# Patient Record
Sex: Male | Born: 1980 | Race: White | Hispanic: No | Marital: Married | Smoking: Never smoker
Health system: Southern US, Community
[De-identification: ages and names within clinical notes are randomized; demographics above are authoritative.]

## PROBLEM LIST (undated history)

## (undated) DIAGNOSIS — J302 Other seasonal allergic rhinitis: Secondary | ICD-10-CM

## (undated) DIAGNOSIS — G4489 Other headache syndrome: Secondary | ICD-10-CM

## (undated) DIAGNOSIS — J309 Allergic rhinitis, unspecified: Secondary | ICD-10-CM

## (undated) DIAGNOSIS — M4200 Juvenile osteochondrosis of spine, site unspecified: Secondary | ICD-10-CM

## (undated) DIAGNOSIS — F410 Panic disorder [episodic paroxysmal anxiety] without agoraphobia: Secondary | ICD-10-CM

## (undated) DIAGNOSIS — F988 Other specified behavioral and emotional disorders with onset usually occurring in childhood and adolescence: Secondary | ICD-10-CM

## (undated) DIAGNOSIS — H47393 Other disorders of optic disc, bilateral: Secondary | ICD-10-CM

## (undated) DIAGNOSIS — N200 Calculus of kidney: Secondary | ICD-10-CM

## (undated) DIAGNOSIS — F419 Anxiety disorder, unspecified: Secondary | ICD-10-CM

## (undated) DIAGNOSIS — K5790 Diverticulosis of intestine, part unspecified, without perforation or abscess without bleeding: Secondary | ICD-10-CM

## (undated) DIAGNOSIS — M5030 Other cervical disc degeneration, unspecified cervical region: Secondary | ICD-10-CM

## (undated) DIAGNOSIS — S060XAA Concussion with loss of consciousness status unknown, initial encounter: Secondary | ICD-10-CM

## (undated) DIAGNOSIS — Z5189 Encounter for other specified aftercare: Secondary | ICD-10-CM

## (undated) DIAGNOSIS — R1013 Epigastric pain: Secondary | ICD-10-CM

## (undated) DIAGNOSIS — F329 Major depressive disorder, single episode, unspecified: Secondary | ICD-10-CM

## (undated) DIAGNOSIS — K589 Irritable bowel syndrome without diarrhea: Secondary | ICD-10-CM

## (undated) DIAGNOSIS — M47814 Spondylosis without myelopathy or radiculopathy, thoracic region: Secondary | ICD-10-CM

## (undated) DIAGNOSIS — S060X9A Concussion with loss of consciousness of unspecified duration, initial encounter: Secondary | ICD-10-CM

## (undated) DIAGNOSIS — N411 Chronic prostatitis: Secondary | ICD-10-CM

## (undated) DIAGNOSIS — F32A Depression, unspecified: Secondary | ICD-10-CM

## (undated) DIAGNOSIS — H539 Unspecified visual disturbance: Secondary | ICD-10-CM

## (undated) DIAGNOSIS — E538 Deficiency of other specified B group vitamins: Secondary | ICD-10-CM

## (undated) HISTORY — DX: Major depressive disorder, single episode, unspecified: F32.9

## (undated) HISTORY — DX: Other cervical disc degeneration, unspecified cervical region: M50.30

## (undated) HISTORY — DX: Other specified behavioral and emotional disorders with onset usually occurring in childhood and adolescence: F98.8

## (undated) HISTORY — DX: Anxiety disorder, unspecified: F41.9

## (undated) HISTORY — DX: Diverticulosis of intestine, part unspecified, without perforation or abscess without bleeding: K57.90

## (undated) HISTORY — DX: Allergic rhinitis, unspecified: J30.9

## (undated) HISTORY — DX: Juvenile osteochondrosis of spine, site unspecified: M42.00

## (undated) HISTORY — DX: Irritable bowel syndrome, unspecified: K58.9

## (undated) HISTORY — DX: Depression, unspecified: F32.A

## (undated) HISTORY — DX: Spondylosis without myelopathy or radiculopathy, thoracic region: M47.814

## (undated) HISTORY — PX: REFRACTIVE SURGERY: SHX103

## (undated) HISTORY — DX: Other disorders of optic disc, bilateral: H47.393

## (undated) HISTORY — DX: Chronic prostatitis: N41.1

## (undated) HISTORY — DX: Other headache syndrome: G44.89

## (undated) HISTORY — DX: Unspecified visual disturbance: H53.9

## (undated) HISTORY — DX: Calculus of kidney: N20.0

---
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
## 2009-12-13 HISTORY — PX: LASIK: SHX215

## 2014-08-27 DIAGNOSIS — N3010 Interstitial cystitis (chronic) without hematuria: Secondary | ICD-10-CM | POA: Insufficient documentation

## 2015-09-01 ENCOUNTER — Ambulatory Visit (INDEPENDENT_AMBULATORY_CARE_PROVIDER_SITE_OTHER): Payer: 59 | Admitting: Family Medicine

## 2015-09-01 ENCOUNTER — Encounter: Payer: Self-pay | Admitting: Family Medicine

## 2015-09-01 VITALS — BP 112/72 | HR 78 | Temp 98.2°F | Resp 16 | Ht 71.5 in | Wt 173.0 lb

## 2015-09-01 DIAGNOSIS — Z23 Encounter for immunization: Secondary | ICD-10-CM

## 2015-09-01 DIAGNOSIS — F900 Attention-deficit hyperactivity disorder, predominantly inattentive type: Secondary | ICD-10-CM

## 2015-09-01 DIAGNOSIS — N411 Chronic prostatitis: Secondary | ICD-10-CM

## 2015-09-01 DIAGNOSIS — F411 Generalized anxiety disorder: Secondary | ICD-10-CM | POA: Diagnosis not present

## 2015-09-01 DIAGNOSIS — F410 Panic disorder [episodic paroxysmal anxiety] without agoraphobia: Secondary | ICD-10-CM

## 2015-09-01 DIAGNOSIS — F909 Attention-deficit hyperactivity disorder, unspecified type: Secondary | ICD-10-CM

## 2015-09-01 MED ORDER — PAROXETINE HCL 20 MG PO TABS: 20.0000 mg | ORAL_TABLET | Freq: Every day | ORAL | Status: DC

## 2015-09-01 NOTE — Progress Notes (Signed)
Office Note 09/01/2015  CC:  Chief Complaint  Patient presents with  . Establish Care  . Follow-up    medications    HPI:  Bernard Dalton is a 34 y.o. White male who is here to establish care. Patient's most recent primary MD: Dr. Wayne Both, [place], [place]. Old records were not reviewed prior to or during today's visit.  Had been plagued by depression sx's as teen and college student, ended up having to drop out of school at one point.  He says he ended up being dx'd as adult ADHD by a psychiatrist.  Has been doing well on low dose adderall.  GAD with some times of panic attacks: paxil helps, rarely takes xanax (usually situational/when traveling). Interestingly, he has chronic prostatitis sx's for which he has been put on flomax nightly, but also says these sx's flare when he is in a high anxiety state.  He has about 3 mo-worth of adderall between current bottle and 2 additional rx's given by his previous PMD, so he does not need RF of this med at this time and we plan on having him back for routine recheck of this problem in 3 mo.   Past Medical History  Diagnosis Date  . Anxiety and depression   . Diverticulosis     'itis x 2 episodes (dx'd by CT each time)  . Chronic prostatitis   . ADD (attention deficit disorder)   . Nephrolithiasis     noted on CT (1-2 mm)-has never passed a stone that he knows of.  . 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 Lasik Bilateral 2011    Family History  Problem Relation Age of Onset  . Alcohol abuse Mother   . Alcohol abuse Father   . Kidney disease Father   . Alcohol abuse Maternal Grandmother   . Alcohol abuse Maternal Grandfather   . Stroke Paternal Grandmother   . Congestive Heart Failure Paternal Grandmother   . Alcohol abuse Paternal Grandmother   . Alcohol abuse Paternal Grandfather     Social History   Social History  . Marital Status:  Married    Spouse Name: N/A  . Number of Children: N/A  . Years of Education: N/A   Occupational History  . Not on file.   Social History Main Topics  . Smoking status: Never Smoker   . Smokeless tobacco: Never Used  . Alcohol Use: No  . Drug Use: No  . Sexual Activity: Not on file   Other Topics Concern  . Not on file   Social History Narrative   Married, 1 child and 1 on the way.   Relocated from [facility] to [facility] 08/2015.   Educ: college at [employer].   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   No T/A/Ds.    Outpatient Encounter Prescriptions as of 09/01/2015  Medication Sig  . ADDERALL XR 5 MG 24 hr capsule Take 5 mg by mouth daily.  Marland Kitchen ALPRAZolam (XANAX) 0.25 MG tablet Take 1 tablet by mouth daily as needed.  Marland Kitchen PARoxetine (PAXIL) 20 MG tablet Take 1 tablet (20 mg total) by mouth daily.  . tamsulosin (FLOMAX) 0.4 MG CAPS capsule Take 0.4 mg by mouth daily.  . [DISCONTINUED] PARoxetine (PAXIL) 20 MG tablet Take 20 mg by mouth daily.   No facility-administered encounter medications on file as of 09/01/2015.    No Known Allergies  ROS Review  of Systems  Constitutional: Negative for fever and fatigue.  HENT: Negative for congestion and sore throat.   Eyes: Negative for visual disturbance.  Respiratory: Negative for cough.   Cardiovascular: Negative for chest pain.  Gastrointestinal: Negative for nausea and abdominal pain.  Genitourinary: Negative for dysuria.  Musculoskeletal: Negative for back pain and joint swelling.  Skin: Negative for rash.  Neurological: Negative for weakness and headaches.  Hematological: Negative for adenopathy.    PE; Blood pressure 112/72, pulse 78, temperature 98.2 F (36.8 C), temperature source Oral, resp. rate 16, height 5' 11.5" (1.816 m), weight 173 lb (78.472 kg), SpO2 100 %. Wt Readings from Last 2 Encounters:  09/01/15 173 lb (78.472 kg)    Gen: alert, oriented x 4, affect pleasant.  Lucid thinking and conversation noted. HEENT:  PERRLA, EOMI.   Neck: no LAD, mass, or thyromegaly. CV: RRR, no m/r/g LUNGS: CTA bilat, nonlabored. NEURO: no tremor or tics noted on observation.  Coordination intact. CN 2-12 grossly intact bilaterally, strength 5/5 in all extremeties.  No ataxia. Patellar DTRs 2+ bilat, 1 beat clonus in ankles bilat  Pertinent labs:  none  ASSESSMENT AND PLAN:   New pt; obtain old records.  1) Adult ADHD: The current medical regimen is effective;  continue present plan and medications. No new rx's needed today. F/u 3 mo and he'll need new rx's at that time.  2) GAD with panic: The current medical regimen is effective;  continue present plan and medications.  3) Chronic prostatitis: non-infectious??. Continue flomax.  An After Visit Summary was printed and given to the patient.  Return in about 3 months (around 12/01/2015) for f/u adult ADHD.

## 2015-09-01 NOTE — Addendum Note (Signed)
Addended by: Lanae Crumbly on: 09/01/2015 10:56 AM   Modules accepted: Orders

## 2015-09-01 NOTE — Progress Notes (Signed)
Pre visit review using our clinic review tool, if applicable. No additional management support is needed unless otherwise documented below in the visit note. 

## 2015-09-07 ENCOUNTER — Encounter: Payer: Self-pay | Admitting: Family Medicine

## 2016-02-20 ENCOUNTER — Encounter: Payer: Self-pay | Admitting: Family Medicine

## 2016-02-20 ENCOUNTER — Ambulatory Visit (INDEPENDENT_AMBULATORY_CARE_PROVIDER_SITE_OTHER): Payer: 59 | Admitting: Family Medicine

## 2016-02-20 VITALS — BP 119/78 | HR 73 | Temp 97.9°F | Resp 16 | Ht 71.5 in | Wt 187.2 lb

## 2016-02-20 DIAGNOSIS — F900 Attention-deficit hyperactivity disorder, predominantly inattentive type: Secondary | ICD-10-CM

## 2016-02-20 DIAGNOSIS — R3915 Urgency of urination: Secondary | ICD-10-CM

## 2016-02-20 DIAGNOSIS — F418 Other specified anxiety disorders: Secondary | ICD-10-CM | POA: Diagnosis not present

## 2016-02-20 DIAGNOSIS — F32A Depression, unspecified: Secondary | ICD-10-CM

## 2016-02-20 DIAGNOSIS — F419 Anxiety disorder, unspecified: Principal | ICD-10-CM

## 2016-02-20 DIAGNOSIS — F909 Attention-deficit hyperactivity disorder, unspecified type: Secondary | ICD-10-CM

## 2016-02-20 DIAGNOSIS — F329 Major depressive disorder, single episode, unspecified: Secondary | ICD-10-CM

## 2016-02-20 MED ORDER — PAROXETINE HCL 20 MG PO TABS: 20.0000 mg | ORAL_TABLET | Freq: Every day | ORAL | Status: DC

## 2016-02-20 NOTE — Progress Notes (Signed)
Pre visit review using our clinic review tool, if applicable. No additional management support is needed unless otherwise documented below in the visit note. 

## 2016-02-20 NOTE — Progress Notes (Signed)
OFFICE VISIT  02/20/2016   CC:  Chief Complaint  Patient presents with  . Follow-up    Anxiety/Depression    HPI:    Patient is a 35 y.o. Caucasian male who presents for f/u anxiety and depression, med issues. He describes recent worsening of anxiety that he blames on taking adderall and paxil together. Has prob's with recurrent prostatitis-type issues, lots of this anxiety has to do with his fears about his urination problem. Paxil has helped but he would prefer to ween off this and see a counselor: says he feels like this med makes him feel like he's in a fog and plus he always feels hungry.  Has gained 14 lbs since last visit with me about 6 mo ago. He has not taken the adderall in about 5 months.  He has recently been taking elmiron for the last few months--a leftover rx from a urologist her saw in [place].  No cystoscopy has been done yet but pt feels like he is ready to proceed with this type of diagnostic procedure if it would help figure out what's wrong. He is not sure if he is seeing any improvement in his sx's or not since getting on elmiron.   Suffers from urinary urgency but says it is ONLY when he leaves his house.  Denies depression.  Past Medical History  Diagnosis Date  . Anxiety and depression   . Diverticulosis     with 'itis per pt report; however, review of old record CT reports shows mild SB enteritis with mesenteric adenopathy 06/2015; then 11/2014 CT abd/pelv done for urinary frequency and hematuria showed NORMAL abd/pelv  . Chronic prostatitis   . ADD (attention deficit disorder)   . Nephrolithiasis     Nonobstructive, noted on CT (1-2 mm)-has never passed a stone that he knows of.  . 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 IBS (irritable bowel syndrome)     diarr predom as per old PCP records    Past Surgical History  Procedure Laterality Date  . Lasik Bilateral 2011   MEDS: not taking adderall or flomax listed below. He is  taking elmiron 100 mg tid rx'd by a Urologist in [place] in the past.  Outpatient Prescriptions Prior to Visit  Medication Sig Dispense Refill  . ALPRAZolam (XANAX) 0.25 MG tablet Take 1 tablet by mouth daily as needed. Reported on 02/20/2016  2  . PARoxetine (PAXIL) 20 MG tablet Take 1 tablet (20 mg total) by mouth daily. 90 tablet 3  . ADDERALL XR 5 MG 24 hr capsule Take 5 mg by mouth daily. Reported on 02/20/2016  0  . tamsulosin (FLOMAX) 0.4 MG CAPS capsule Take 0.4 mg by mouth daily. Reported on 02/20/2016  11   No facility-administered medications prior to visit.    No Known Allergies  ROS As per HPI  PE: Blood pressure 119/78, pulse 73, temperature 97.9 F (36.6 C), temperature source Oral, resp. rate 16, height 5' 11.5" (1.816 m), weight 187 lb 4 oz (84.936 kg), SpO2 96 %. Gen: Alert, well appearing.  Patient is oriented to person, place, time, and situation. No further exam today.  LABS:  none  IMPRESSION AND PLAN:  1) Chronic anxiety, with some superimposed panic.  This was made worse by combining paxil and adderall (he did fine on adderall by itself prior to paxil).  Stopping adderall has made it where he can tolerate and is helped by paxil, but he wants to see a  psychologist for help with his anxiety---esp the part surrounding his urinary urgency issues.  Gave pt the contact info today for psychologist Doroteo Glassman in [place]. Pt does want to ween down off the paxil at some point but NOT now, so this med was RF'd again today.  2) Recurrent urinary urgency, ? Only when out in public?   Pt is aware that this may be all anxiety related, but he does want to seek out a local urologist for consulation. I gave him the contact info for Dr. Risa Grill with [facility].  An After Visit Summary was printed and given to the patient.  FOLLOW UP: Return in about 6 months (around 08/22/2016) for f/u anxiety.

## 2016-02-20 NOTE — Patient Instructions (Signed)
Psychologist: Doroteo Glassman [phone] (In the office of Dr. Letta Moynahan at [facility] in [place]).  Urologist: Dr. Rana Snare with [facility] in [place].

## 2016-07-14 DIAGNOSIS — R278 Other lack of coordination: Secondary | ICD-10-CM | POA: Diagnosis not present

## 2016-07-14 DIAGNOSIS — M62838 Other muscle spasm: Secondary | ICD-10-CM | POA: Diagnosis not present

## 2016-07-14 DIAGNOSIS — M6281 Muscle weakness (generalized): Secondary | ICD-10-CM | POA: Diagnosis not present

## 2016-07-14 DIAGNOSIS — M545 Low back pain: Secondary | ICD-10-CM | POA: Diagnosis not present

## 2016-07-16 DIAGNOSIS — F411 Generalized anxiety disorder: Secondary | ICD-10-CM | POA: Diagnosis not present

## 2016-07-22 DIAGNOSIS — F419 Anxiety disorder, unspecified: Secondary | ICD-10-CM | POA: Diagnosis not present

## 2016-07-23 DIAGNOSIS — M62838 Other muscle spasm: Secondary | ICD-10-CM | POA: Diagnosis not present

## 2016-07-23 DIAGNOSIS — R278 Other lack of coordination: Secondary | ICD-10-CM | POA: Diagnosis not present

## 2016-07-23 DIAGNOSIS — R102 Pelvic and perineal pain: Secondary | ICD-10-CM | POA: Diagnosis not present

## 2016-07-23 DIAGNOSIS — M6281 Muscle weakness (generalized): Secondary | ICD-10-CM | POA: Diagnosis not present

## 2016-07-28 ENCOUNTER — Ambulatory Visit (INDEPENDENT_AMBULATORY_CARE_PROVIDER_SITE_OTHER): Payer: BLUE CROSS/BLUE SHIELD | Admitting: Family Medicine

## 2016-07-28 ENCOUNTER — Encounter: Payer: Self-pay | Admitting: Family Medicine

## 2016-07-28 VITALS — BP 125/75 | HR 64 | Temp 98.4°F | Resp 16 | Ht 71.5 in | Wt 164.0 lb

## 2016-07-28 DIAGNOSIS — R000 Tachycardia, unspecified: Secondary | ICD-10-CM | POA: Diagnosis not present

## 2016-07-28 DIAGNOSIS — N3010 Interstitial cystitis (chronic) without hematuria: Secondary | ICD-10-CM | POA: Diagnosis not present

## 2016-07-28 DIAGNOSIS — E162 Hypoglycemia, unspecified: Secondary | ICD-10-CM | POA: Diagnosis not present

## 2016-07-28 LAB — GLUCOSE, POCT (MANUAL RESULT ENTRY): POC Glucose: 104 mg/dl — AB (ref 70–99)

## 2016-07-28 NOTE — Progress Notes (Signed)
OFFICE VISIT  07/28/2016   CC:  Chief Complaint  Patient presents with  . Hypoglycemia    fasting BS of 27 this morning   HPI:    Patient is a 35 y.o. Caucasian male who presents for ongoing anxiety and anxiety-related physical symptoms.  Started getting worse about 7 weeks ago. Recently saw NP or counselor at his psychiatrist's office b/c of recurrent panic type feelings.  She suggested he check his glucose. He got a glucometer and started checking glucose yesterday:  checked his sugar right after eating and it was 40 once, 33 about 15 min later.  One hour later it was 102.  This morning it was 27 fasting.  He ate oatmeal about an hour later, and then glucose check 1 hour later was 94.  Describes heart racing, palms sweaty, jittery, feeling anxious/unease.  Says these episodes occur "regularly" and got worse when he went on a diet.  He thinks the sx's are improved some since stopping the diet and starting to eat more carbs.  Upon further reflection, he does think his sx's occur more when he is fasting/between meals and seems better when he has eaten.  Since I saw him last he has established with Doroteo Glassman for counseling and Dr. Lita Mains for psychiatry. He has also started seeing Dr. Gaynelle Arabian at [facility] and he gets pelvic floor PT there as well. Says urologic sx's are getting better with PT.  Past Medical History:  Diagnosis Date  . ADD (attention deficit disorder)   . Anxiety and depression   . Chronic prostatitis   . Diverticulosis    with 'itis per pt report; however, review of old record CT reports shows mild SB enteritis with mesenteric adenopathy 06/2015; then 11/2014 CT abd/pelv done for urinary frequency and hematuria showed NORMAL abd/pelv  . IBS (irritable bowel syndrome)    diarr predom as per old PCP records  . Nephrolithiasis    Nonobstructive, noted on CT (1-2 mm)-has never passed a stone that he knows of.  . Scheuermann's kyphosis    juvenile kyphosis (no  surgery)    Past Surgical History:  Procedure Laterality Date  . LASIK Bilateral 2011    Outpatient Medications Prior to Visit  Medication Sig Dispense Refill  . ALPRAZolam (XANAX) 0.25 MG tablet Take 1 tablet by mouth daily as needed. Reported on 02/20/2016  2  . ELMIRON 100 MG capsule Take 1 capsule by mouth 3 (three) times daily.  7  . PARoxetine (PAXIL) 20 MG tablet Take 1 tablet (20 mg total) by mouth daily. (Patient not taking: Reported on 07/28/2016) 30 tablet 11   No facility-administered medications prior to visit.     No Known Allergies  ROS As per HPI  PE: Blood pressure 125/75, pulse 64, temperature 98.4 F (36.9 C), temperature source Oral, resp. rate 16, height 5' 11.5" (1.816 m), weight 164 lb (74.4 kg), SpO2 99 %. Gen: Alert, well appearing.  Patient is oriented to person, place, time, and situation. AFFECT: pleasant, lucid thought and speech. [id number]: no injection, icteris, swelling, or exudate.  EOMI, PERRLA. Mouth: lips without lesion/swelling.  Oral mucosa pink and moist. Oropharynx without erythema, exudate, or swelling.  CV: RRR, no m/r/g.   LUNGS: CTA bilat, nonlabored resps, good aeration in all lung fields. EXT: no clubbing, cyanosis, or edema.  Neuro: CN 2-12 intact bilaterally, strength 5/5 in proximal and distal upper extremities and lower extremities bilaterally.   No tremor.    No ataxia.  LABS:  POCT glucose on our machine today was 104. Pt's glucometer today at same time was 101.  IMPRESSION AND PLAN:  Hypoglycemia, symptomatic.  Per pt's home glucometer readings, which correlates with our glucometer here in the office today.  Encouraged pt to eat many small meals throughout his day, continue to monitor home glucoses. He'll return tomorrow for fasting labs: CMET, CBC, TSH, T4/T3, insulin and C peptide levels, cortisol, prolactin, LH. Will proceed with abdominal imaging IF labs suggest dx of possible insulinoma. Holding off on endo referral  at this time.  An After Visit Summary was printed and given to the patient.  FOLLOW UP: Return for f/u to be determined based on results of w/u.  Signed:  Crissie Sickles, MD           07/28/2016

## 2016-07-28 NOTE — Progress Notes (Signed)
Pre visit review using our clinic review tool, if applicable. No additional management support is needed unless otherwise documented below in the visit note. 

## 2016-07-29 ENCOUNTER — Other Ambulatory Visit (INDEPENDENT_AMBULATORY_CARE_PROVIDER_SITE_OTHER): Payer: BLUE CROSS/BLUE SHIELD

## 2016-07-29 ENCOUNTER — Other Ambulatory Visit: Payer: Self-pay | Admitting: Family Medicine

## 2016-07-29 DIAGNOSIS — R000 Tachycardia, unspecified: Secondary | ICD-10-CM

## 2016-07-29 DIAGNOSIS — E162 Hypoglycemia, unspecified: Secondary | ICD-10-CM

## 2016-07-29 LAB — CBC WITH DIFFERENTIAL/PLATELET
BASOS ABS: 0 {cells}/uL (ref 0–200)
Basophils Relative: 0 %
Eosinophils Absolute: 43 cells/uL (ref 15–500)
Eosinophils Relative: 1 %
HCT: 44.3 % (ref 38.5–50.0)
HEMOGLOBIN: 15 g/dL (ref 13.2–17.1)
LYMPHS ABS: 1118 {cells}/uL (ref 850–3900)
LYMPHS PCT: 26 %
MCH: 29.6 pg (ref 27.0–33.0)
MCHC: 33.9 g/dL (ref 32.0–36.0)
MCV: 87.5 fL (ref 80.0–100.0)
MONO ABS: 258 {cells}/uL (ref 200–950)
MPV: 10.7 fL (ref 7.5–12.5)
Monocytes Relative: 6 %
Neutro Abs: 2881 cells/uL (ref 1500–7800)
Neutrophils Relative %: 67 %
Platelets: 178 10*3/uL (ref 140–400)
RBC: 5.06 MIL/uL (ref 4.20–5.80)
RDW: 13.7 % (ref 11.0–15.0)
WBC: 4.3 10*3/uL (ref 3.8–10.8)

## 2016-07-29 LAB — COMPREHENSIVE METABOLIC PANEL
ALBUMIN: 4.3 g/dL (ref 3.6–5.1)
ALK PHOS: 47 U/L (ref 40–115)
ALT: 10 U/L (ref 9–46)
AST: 10 U/L (ref 10–40)
BILIRUBIN TOTAL: 1.1 mg/dL (ref 0.2–1.2)
BUN: 10 mg/dL (ref 7–25)
CO2: 25 mmol/L (ref 20–31)
CREATININE: 1.04 mg/dL (ref 0.60–1.35)
Calcium: 9.7 mg/dL (ref 8.6–10.3)
Chloride: 106 mmol/L (ref 98–110)
Glucose, Bld: 96 mg/dL (ref 65–99)
Potassium: 4.6 mmol/L (ref 3.5–5.3)
SODIUM: 142 mmol/L (ref 135–146)
TOTAL PROTEIN: 6.3 g/dL (ref 6.1–8.1)

## 2016-07-29 LAB — TSH: TSH: 1.31 mIU/L (ref 0.40–4.50)

## 2016-07-29 LAB — T4, FREE: Free T4: 1.2 ng/dL (ref 0.8–1.8)

## 2016-07-29 LAB — HEMOGLOBIN A1C
HEMOGLOBIN A1C: 4.7 % (ref <5.7)
MEAN PLASMA GLUCOSE: 88 mg/dL

## 2016-07-30 LAB — T3: T3, Total: 121 ng/dL (ref 76–181)

## 2016-07-30 LAB — C-PEPTIDE: C-Peptide: 1.61 ng/mL (ref 0.80–3.85)

## 2016-07-30 LAB — LUTEINIZING HORMONE: LH: 2 m[IU]/mL (ref 1.5–9.3)

## 2016-07-30 LAB — CORTISOL: Cortisol, Plasma: 19.7 ug/dL

## 2016-07-30 LAB — PROLACTIN: PROLACTIN: 10.9 ng/mL (ref 2.0–18.0)

## 2016-07-31 LAB — INSULIN, RANDOM: INSULIN: 4.6 u[IU]/mL (ref 2.0–19.6)

## 2016-08-03 DIAGNOSIS — M62838 Other muscle spasm: Secondary | ICD-10-CM | POA: Diagnosis not present

## 2016-08-03 DIAGNOSIS — R278 Other lack of coordination: Secondary | ICD-10-CM | POA: Diagnosis not present

## 2016-08-03 DIAGNOSIS — M6281 Muscle weakness (generalized): Secondary | ICD-10-CM | POA: Diagnosis not present

## 2016-08-03 DIAGNOSIS — R102 Pelvic and perineal pain: Secondary | ICD-10-CM | POA: Diagnosis not present

## 2016-08-03 LAB — INSULIN ANTIBODIES, BLOOD: Insulin Antibodies, Human: <0.4 U/mL (ref <0.4)

## 2016-08-05 DIAGNOSIS — F419 Anxiety disorder, unspecified: Secondary | ICD-10-CM | POA: Diagnosis not present

## 2016-08-05 LAB — INSULIN, FREE (BIOACTIVE): INSULIN, FREE: 4.3 u[IU]/mL (ref 1.5–14.9)

## 2016-08-18 DIAGNOSIS — R278 Other lack of coordination: Secondary | ICD-10-CM | POA: Diagnosis not present

## 2016-08-18 DIAGNOSIS — M62838 Other muscle spasm: Secondary | ICD-10-CM | POA: Diagnosis not present

## 2016-08-18 DIAGNOSIS — M6281 Muscle weakness (generalized): Secondary | ICD-10-CM | POA: Diagnosis not present

## 2016-08-18 DIAGNOSIS — R102 Pelvic and perineal pain: Secondary | ICD-10-CM | POA: Diagnosis not present

## 2016-08-19 DIAGNOSIS — F419 Anxiety disorder, unspecified: Secondary | ICD-10-CM | POA: Diagnosis not present

## 2016-09-01 DIAGNOSIS — M545 Low back pain: Secondary | ICD-10-CM | POA: Diagnosis not present

## 2016-09-01 DIAGNOSIS — M62838 Other muscle spasm: Secondary | ICD-10-CM | POA: Diagnosis not present

## 2016-09-01 DIAGNOSIS — M6281 Muscle weakness (generalized): Secondary | ICD-10-CM | POA: Diagnosis not present

## 2016-09-01 DIAGNOSIS — R278 Other lack of coordination: Secondary | ICD-10-CM | POA: Diagnosis not present

## 2016-09-02 DIAGNOSIS — F419 Anxiety disorder, unspecified: Secondary | ICD-10-CM | POA: Diagnosis not present

## 2016-09-03 DIAGNOSIS — F411 Generalized anxiety disorder: Secondary | ICD-10-CM | POA: Diagnosis not present

## 2016-09-09 DIAGNOSIS — R278 Other lack of coordination: Secondary | ICD-10-CM | POA: Diagnosis not present

## 2016-09-09 DIAGNOSIS — M62838 Other muscle spasm: Secondary | ICD-10-CM | POA: Diagnosis not present

## 2016-09-09 DIAGNOSIS — R102 Pelvic and perineal pain: Secondary | ICD-10-CM | POA: Diagnosis not present

## 2016-09-09 DIAGNOSIS — M6281 Muscle weakness (generalized): Secondary | ICD-10-CM | POA: Diagnosis not present

## 2016-09-15 DIAGNOSIS — H5371 Glare sensitivity: Secondary | ICD-10-CM | POA: Diagnosis not present

## 2016-09-15 DIAGNOSIS — H53143 Visual discomfort, bilateral: Secondary | ICD-10-CM | POA: Diagnosis not present

## 2016-09-15 DIAGNOSIS — H01001 Unspecified blepharitis right upper eyelid: Secondary | ICD-10-CM | POA: Diagnosis not present

## 2016-09-15 DIAGNOSIS — H25013 Cortical age-related cataract, bilateral: Secondary | ICD-10-CM | POA: Diagnosis not present

## 2016-09-17 DIAGNOSIS — F419 Anxiety disorder, unspecified: Secondary | ICD-10-CM | POA: Diagnosis not present

## 2016-09-22 DIAGNOSIS — F419 Anxiety disorder, unspecified: Secondary | ICD-10-CM | POA: Diagnosis not present

## 2016-09-24 DIAGNOSIS — F411 Generalized anxiety disorder: Secondary | ICD-10-CM | POA: Diagnosis not present

## 2016-09-24 DIAGNOSIS — F902 Attention-deficit hyperactivity disorder, combined type: Secondary | ICD-10-CM | POA: Diagnosis not present

## 2016-09-24 DIAGNOSIS — F319 Bipolar disorder, unspecified: Secondary | ICD-10-CM | POA: Diagnosis not present

## 2016-09-24 DIAGNOSIS — F314 Bipolar disorder, current episode depressed, severe, without psychotic features: Secondary | ICD-10-CM | POA: Diagnosis not present

## 2016-09-27 DIAGNOSIS — H01001 Unspecified blepharitis right upper eyelid: Secondary | ICD-10-CM | POA: Diagnosis not present

## 2016-09-27 DIAGNOSIS — H25013 Cortical age-related cataract, bilateral: Secondary | ICD-10-CM | POA: Diagnosis not present

## 2016-09-27 DIAGNOSIS — H5371 Glare sensitivity: Secondary | ICD-10-CM | POA: Diagnosis not present

## 2016-09-27 DIAGNOSIS — H53143 Visual discomfort, bilateral: Secondary | ICD-10-CM | POA: Diagnosis not present

## 2016-09-29 DIAGNOSIS — F419 Anxiety disorder, unspecified: Secondary | ICD-10-CM | POA: Diagnosis not present

## 2016-10-06 DIAGNOSIS — F319 Bipolar disorder, unspecified: Secondary | ICD-10-CM | POA: Diagnosis not present

## 2016-10-06 DIAGNOSIS — F411 Generalized anxiety disorder: Secondary | ICD-10-CM | POA: Diagnosis not present

## 2016-10-06 DIAGNOSIS — F419 Anxiety disorder, unspecified: Secondary | ICD-10-CM | POA: Diagnosis not present

## 2016-10-06 DIAGNOSIS — F902 Attention-deficit hyperactivity disorder, combined type: Secondary | ICD-10-CM | POA: Diagnosis not present

## 2016-10-13 DIAGNOSIS — F419 Anxiety disorder, unspecified: Secondary | ICD-10-CM | POA: Diagnosis not present

## 2016-10-22 DIAGNOSIS — F419 Anxiety disorder, unspecified: Secondary | ICD-10-CM | POA: Diagnosis not present

## 2016-10-27 DIAGNOSIS — F319 Bipolar disorder, unspecified: Secondary | ICD-10-CM | POA: Diagnosis not present

## 2016-10-27 DIAGNOSIS — F902 Attention-deficit hyperactivity disorder, combined type: Secondary | ICD-10-CM | POA: Diagnosis not present

## 2016-10-27 DIAGNOSIS — F411 Generalized anxiety disorder: Secondary | ICD-10-CM | POA: Diagnosis not present

## 2016-10-27 DIAGNOSIS — F419 Anxiety disorder, unspecified: Secondary | ICD-10-CM | POA: Diagnosis not present

## 2016-11-07 DIAGNOSIS — S81811A Laceration without foreign body, right lower leg, initial encounter: Secondary | ICD-10-CM | POA: Diagnosis not present

## 2016-11-10 DIAGNOSIS — F419 Anxiety disorder, unspecified: Secondary | ICD-10-CM | POA: Diagnosis not present

## 2016-11-12 ENCOUNTER — Telehealth: Payer: Self-pay | Admitting: Family Medicine

## 2016-11-12 MED ORDER — SCOPOLAMINE 1 MG/3DAYS TD PT72: 1.0000 | MEDICATED_PATCH | TRANSDERMAL | 1 refills | Status: DC

## 2016-11-12 NOTE — Telephone Encounter (Signed)
Scopolamine patch eRx'd as per pt request.

## 2016-11-12 NOTE — Telephone Encounter (Signed)
Pt advised and voiced understanding.   

## 2016-11-12 NOTE — Telephone Encounter (Signed)
Patient is going on cruise in Feb. Can he get a patch for motional sickness?

## 2016-11-16 DIAGNOSIS — F419 Anxiety disorder, unspecified: Secondary | ICD-10-CM | POA: Diagnosis not present

## 2016-11-24 DIAGNOSIS — F419 Anxiety disorder, unspecified: Secondary | ICD-10-CM | POA: Diagnosis not present

## 2016-11-30 DIAGNOSIS — N3010 Interstitial cystitis (chronic) without hematuria: Secondary | ICD-10-CM | POA: Diagnosis not present

## 2016-12-01 DIAGNOSIS — F419 Anxiety disorder, unspecified: Secondary | ICD-10-CM | POA: Diagnosis not present

## 2016-12-24 DIAGNOSIS — F419 Anxiety disorder, unspecified: Secondary | ICD-10-CM | POA: Diagnosis not present

## 2017-01-03 DIAGNOSIS — F419 Anxiety disorder, unspecified: Secondary | ICD-10-CM | POA: Diagnosis not present

## 2017-01-07 DIAGNOSIS — F411 Generalized anxiety disorder: Secondary | ICD-10-CM | POA: Diagnosis not present

## 2017-01-11 DIAGNOSIS — F419 Anxiety disorder, unspecified: Secondary | ICD-10-CM | POA: Diagnosis not present

## 2017-01-27 DIAGNOSIS — F419 Anxiety disorder, unspecified: Secondary | ICD-10-CM | POA: Diagnosis not present

## 2017-02-02 DIAGNOSIS — F419 Anxiety disorder, unspecified: Secondary | ICD-10-CM | POA: Diagnosis not present

## 2017-02-08 DIAGNOSIS — F419 Anxiety disorder, unspecified: Secondary | ICD-10-CM | POA: Diagnosis not present

## 2017-02-16 ENCOUNTER — Encounter: Payer: Self-pay | Admitting: *Deleted

## 2017-02-16 DIAGNOSIS — F419 Anxiety disorder, unspecified: Secondary | ICD-10-CM | POA: Diagnosis not present

## 2017-03-03 DIAGNOSIS — F419 Anxiety disorder, unspecified: Secondary | ICD-10-CM | POA: Diagnosis not present

## 2017-03-10 DIAGNOSIS — F419 Anxiety disorder, unspecified: Secondary | ICD-10-CM | POA: Diagnosis not present

## 2017-03-16 DIAGNOSIS — F419 Anxiety disorder, unspecified: Secondary | ICD-10-CM | POA: Diagnosis not present

## 2017-03-23 DIAGNOSIS — F419 Anxiety disorder, unspecified: Secondary | ICD-10-CM | POA: Diagnosis not present

## 2017-03-23 DIAGNOSIS — F411 Generalized anxiety disorder: Secondary | ICD-10-CM | POA: Diagnosis not present

## 2017-03-29 DIAGNOSIS — F419 Anxiety disorder, unspecified: Secondary | ICD-10-CM | POA: Diagnosis not present

## 2017-04-05 DIAGNOSIS — N3010 Interstitial cystitis (chronic) without hematuria: Secondary | ICD-10-CM | POA: Diagnosis not present

## 2017-04-07 DIAGNOSIS — F419 Anxiety disorder, unspecified: Secondary | ICD-10-CM | POA: Diagnosis not present

## 2017-04-13 DIAGNOSIS — F419 Anxiety disorder, unspecified: Secondary | ICD-10-CM | POA: Diagnosis not present

## 2017-04-20 DIAGNOSIS — F419 Anxiety disorder, unspecified: Secondary | ICD-10-CM | POA: Diagnosis not present

## 2017-04-28 DIAGNOSIS — F419 Anxiety disorder, unspecified: Secondary | ICD-10-CM | POA: Diagnosis not present

## 2017-05-18 DIAGNOSIS — F411 Generalized anxiety disorder: Secondary | ICD-10-CM | POA: Diagnosis not present

## 2017-05-18 DIAGNOSIS — F319 Bipolar disorder, unspecified: Secondary | ICD-10-CM | POA: Diagnosis not present

## 2017-05-18 DIAGNOSIS — F902 Attention-deficit hyperactivity disorder, combined type: Secondary | ICD-10-CM | POA: Diagnosis not present

## 2017-05-18 DIAGNOSIS — F419 Anxiety disorder, unspecified: Secondary | ICD-10-CM | POA: Diagnosis not present

## 2017-05-25 DIAGNOSIS — F419 Anxiety disorder, unspecified: Secondary | ICD-10-CM | POA: Diagnosis not present

## 2017-06-02 DIAGNOSIS — F419 Anxiety disorder, unspecified: Secondary | ICD-10-CM | POA: Diagnosis not present

## 2017-06-21 DIAGNOSIS — F419 Anxiety disorder, unspecified: Secondary | ICD-10-CM | POA: Diagnosis not present

## 2017-06-28 DIAGNOSIS — F419 Anxiety disorder, unspecified: Secondary | ICD-10-CM | POA: Diagnosis not present

## 2017-07-07 DIAGNOSIS — F419 Anxiety disorder, unspecified: Secondary | ICD-10-CM | POA: Diagnosis not present

## 2017-07-13 DIAGNOSIS — F902 Attention-deficit hyperactivity disorder, combined type: Secondary | ICD-10-CM | POA: Diagnosis not present

## 2017-07-13 DIAGNOSIS — F319 Bipolar disorder, unspecified: Secondary | ICD-10-CM | POA: Diagnosis not present

## 2017-07-13 DIAGNOSIS — F411 Generalized anxiety disorder: Secondary | ICD-10-CM | POA: Diagnosis not present

## 2017-07-14 DIAGNOSIS — F419 Anxiety disorder, unspecified: Secondary | ICD-10-CM | POA: Diagnosis not present

## 2017-08-04 DIAGNOSIS — F419 Anxiety disorder, unspecified: Secondary | ICD-10-CM | POA: Diagnosis not present

## 2017-08-07 DIAGNOSIS — L255 Unspecified contact dermatitis due to plants, except food: Secondary | ICD-10-CM | POA: Diagnosis not present

## 2017-08-11 DIAGNOSIS — F419 Anxiety disorder, unspecified: Secondary | ICD-10-CM | POA: Diagnosis not present

## 2017-08-25 DIAGNOSIS — F419 Anxiety disorder, unspecified: Secondary | ICD-10-CM | POA: Diagnosis not present

## 2017-08-31 DIAGNOSIS — F419 Anxiety disorder, unspecified: Secondary | ICD-10-CM | POA: Diagnosis not present

## 2017-09-06 DIAGNOSIS — F419 Anxiety disorder, unspecified: Secondary | ICD-10-CM | POA: Diagnosis not present

## 2017-09-16 DIAGNOSIS — F419 Anxiety disorder, unspecified: Secondary | ICD-10-CM | POA: Diagnosis not present

## 2017-09-26 DIAGNOSIS — F419 Anxiety disorder, unspecified: Secondary | ICD-10-CM | POA: Diagnosis not present

## 2017-10-05 DIAGNOSIS — F419 Anxiety disorder, unspecified: Secondary | ICD-10-CM | POA: Diagnosis not present

## 2017-10-19 DIAGNOSIS — F419 Anxiety disorder, unspecified: Secondary | ICD-10-CM | POA: Diagnosis not present

## 2017-10-19 DIAGNOSIS — F411 Generalized anxiety disorder: Secondary | ICD-10-CM | POA: Diagnosis not present

## 2017-10-20 DIAGNOSIS — N3010 Interstitial cystitis (chronic) without hematuria: Secondary | ICD-10-CM | POA: Diagnosis not present

## 2017-11-09 DIAGNOSIS — F419 Anxiety disorder, unspecified: Secondary | ICD-10-CM | POA: Diagnosis not present

## 2017-11-23 DIAGNOSIS — F419 Anxiety disorder, unspecified: Secondary | ICD-10-CM | POA: Diagnosis not present

## 2017-12-01 DIAGNOSIS — F419 Anxiety disorder, unspecified: Secondary | ICD-10-CM | POA: Diagnosis not present

## 2017-12-13 DIAGNOSIS — H47393 Other disorders of optic disc, bilateral: Secondary | ICD-10-CM

## 2017-12-13 DIAGNOSIS — M47814 Spondylosis without myelopathy or radiculopathy, thoracic region: Secondary | ICD-10-CM

## 2017-12-13 HISTORY — DX: Spondylosis without myelopathy or radiculopathy, thoracic region: M47.814

## 2017-12-13 HISTORY — DX: Other disorders of optic disc, bilateral: H47.393

## 2017-12-16 DIAGNOSIS — F419 Anxiety disorder, unspecified: Secondary | ICD-10-CM | POA: Diagnosis not present

## 2017-12-19 ENCOUNTER — Encounter: Payer: Self-pay | Admitting: Family Medicine

## 2017-12-19 ENCOUNTER — Ambulatory Visit: Payer: BLUE CROSS/BLUE SHIELD | Admitting: Family Medicine

## 2017-12-19 VITALS — BP 108/64 | HR 50 | Temp 98.4°F | Resp 16 | Ht 71.5 in | Wt 154.0 lb

## 2017-12-19 DIAGNOSIS — M7541 Impingement syndrome of right shoulder: Secondary | ICD-10-CM

## 2017-12-19 DIAGNOSIS — M7502 Adhesive capsulitis of left shoulder: Secondary | ICD-10-CM | POA: Diagnosis not present

## 2017-12-19 DIAGNOSIS — M7501 Adhesive capsulitis of right shoulder: Secondary | ICD-10-CM | POA: Diagnosis not present

## 2017-12-19 DIAGNOSIS — M7542 Impingement syndrome of left shoulder: Secondary | ICD-10-CM | POA: Diagnosis not present

## 2017-12-19 MED ORDER — SCOPOLAMINE 1 MG/3DAYS TD PT72: 1.0000 | MEDICATED_PATCH | TRANSDERMAL | 1 refills | Status: DC

## 2017-12-19 MED ORDER — METHYLPREDNISOLONE ACETATE 80 MG/ML IJ SUSP
80.0000 mg | Freq: Once | INTRAMUSCULAR | Status: AC
  Administered 2017-12-19: 80 mg via INTRAMUSCULAR

## 2017-12-19 NOTE — Progress Notes (Signed)
OFFICE VISIT  12/19/2017   CC:  Chief Complaint  Patient presents with  . Motion Sickness    would like Rx for scopolamine patch, going to a cruise  . Referral    shoulder pain x 1 year, injury back in 10/2016   HPI:    Patient is a 37 y.o. Caucasian male who presents for desire for med to prevent/treat motion sickness b/c he is going on a cruise soon.  Also has shoulder problem: about 1 year ago he was doing very physical labor one day (setting fence posts) and noted L shoulder hurt a lot.  ABduction and ER hurt R shoulder.  No pain when not doing those motions.  Reaching for things hurts it a lot. Lying on L shoulder hurts a lot. Takes ibuprofen every morning-doesn't help much. R shoulder has similar sx's but much less severe. No neck pain, no paresthesias or radiation of pain down arms.  No arm weakness.  Past Medical History:  Diagnosis Date  . ADD (attention deficit disorder)   . Anxiety and depression   . Chronic prostatitis    chronic irritative voiding symptoms: pelvic PT via [facility] helping as of 07/2016  . Diverticulosis    with 'itis per pt report; however, review of old record CT reports shows mild SB enteritis with mesenteric adenopathy 06/2015; then 11/2014 CT abd/pelv done for urinary frequency and hematuria showed NORMAL abd/pelv  . IBS (irritable bowel syndrome)    diarr predom as per old PCP records  . Nephrolithiasis    Nonobstructive, noted on CT (1-2 mm)-has never passed a stone that he knows of.  . 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 LASIK Bilateral 2011    Outpatient Medications Prior to Visit  Medication Sig Dispense Refill  . ADDERALL XR 10 MG 24 hr capsule Take 10 mg by mouth daily.  0  . ELMIRON 100 MG capsule Take 1 capsule by mouth 3 (three) times daily.  7  . ALPRAZolam (XANAX) 0.25 MG tablet Take 1 tablet by mouth daily as needed. Reported on 02/20/2016  2  .  busPIRone (BUSPAR) 15 MG tablet Take 7.5 mg by mouth 2 (two) times daily.    Marland Kitchen lamoTRIgine (LAMICTAL) 25 MG tablet Take 50 mg by mouth 2 (two) times daily.  2  . scopolamine (TRANSDERM-SCOP) 1 MG/3DAYS Place 1 patch (1.5 mg total) onto the skin every 3 (three) days. (Patient not taking: Reported on 12/19/2017) 4 patch 1   No facility-administered medications prior to visit.     No Known Allergies  ROS As per HPI  PE: Blood pressure 108/64, pulse (!) 50, temperature 98.4 F (36.9 C), temperature source Oral, resp. rate 16, height 5' 11.5" (1.816 m), weight 154 lb (69.9 kg), SpO2 100 %. Gen: Alert, well appearing.  Patient is oriented to person, place, time, and situation. AFFECT: pleasant, lucid thought and speech. Shoulders: no tenderness to palpation. Speed's and yergason's neg bilat. ER and IR intact and w/out pain with resistance. aBduction elicits severe pain when he gets to 30 deg on L and to 90 deg on right, and he can only get the left arm abducted to 90 deg with the help of shoulder shrug maneuver. +Impingement signs bilat.  LABS:  none  IMPRESSION AND PLAN:  1) Motion sickness, helped very well in the past by scopolamine patch. Scopolamine patches eRx'd today.  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 Refer to PT. Steroid injection into subacromial space on left shoulder today. If this brings significant relief, he'll make appt to return and get injection into R subacromial space.   Procedure: Therapeutic shoulder injection.  The patient's clinical condition is marked by substantial pain and/or significant functional disability.  Other conservative therapy has not provided relief, is contraindicated, or not appropriate.  There is a reasonable likelihood that injection will significantly improve the patient's pain and/or functional disability. Cleaned skin with alcohol swab, used posterolateral approach, Injected 80mg  of depo medrol + 2 cc  of 1% plain lidocaine into subacromial space without resistance.  No immediate complications.  Patient tolerated procedure well.  Post-injection care discussed, including 20 min of icing 1-2 times in the next 4-8 hours, frequent non weight-bearing ROM exercises over the next few days, and general pain medication management.  An After Visit Summary was printed and given to the patient.  FOLLOW UP: Return if symptoms worsen or fail to improve.  Signed:  Crissie Sickles, MD           12/19/2017

## 2017-12-28 DIAGNOSIS — M7541 Impingement syndrome of right shoulder: Secondary | ICD-10-CM | POA: Diagnosis not present

## 2017-12-28 DIAGNOSIS — M7542 Impingement syndrome of left shoulder: Secondary | ICD-10-CM | POA: Diagnosis not present

## 2017-12-28 DIAGNOSIS — M7502 Adhesive capsulitis of left shoulder: Secondary | ICD-10-CM | POA: Diagnosis not present

## 2018-01-02 DIAGNOSIS — Z23 Encounter for immunization: Secondary | ICD-10-CM | POA: Diagnosis not present

## 2018-01-03 DIAGNOSIS — M40209 Unspecified kyphosis, site unspecified: Secondary | ICD-10-CM | POA: Insufficient documentation

## 2018-01-03 DIAGNOSIS — F419 Anxiety disorder, unspecified: Secondary | ICD-10-CM | POA: Diagnosis not present

## 2018-01-03 DIAGNOSIS — M546 Pain in thoracic spine: Secondary | ICD-10-CM | POA: Insufficient documentation

## 2018-01-07 DIAGNOSIS — M7542 Impingement syndrome of left shoulder: Secondary | ICD-10-CM | POA: Diagnosis not present

## 2018-01-07 DIAGNOSIS — M7502 Adhesive capsulitis of left shoulder: Secondary | ICD-10-CM | POA: Diagnosis not present

## 2018-01-07 DIAGNOSIS — M7541 Impingement syndrome of right shoulder: Secondary | ICD-10-CM | POA: Diagnosis not present

## 2018-01-12 DIAGNOSIS — M7542 Impingement syndrome of left shoulder: Secondary | ICD-10-CM | POA: Diagnosis not present

## 2018-01-12 DIAGNOSIS — M7502 Adhesive capsulitis of left shoulder: Secondary | ICD-10-CM | POA: Diagnosis not present

## 2018-01-12 DIAGNOSIS — M7541 Impingement syndrome of right shoulder: Secondary | ICD-10-CM | POA: Diagnosis not present

## 2018-01-16 ENCOUNTER — Ambulatory Visit: Payer: BLUE CROSS/BLUE SHIELD | Admitting: Family Medicine

## 2018-01-16 ENCOUNTER — Encounter: Payer: Self-pay | Admitting: Family Medicine

## 2018-01-16 VITALS — BP 114/66 | HR 61 | Temp 98.5°F | Resp 16 | Ht 71.5 in | Wt 150.5 lb

## 2018-01-16 DIAGNOSIS — J0100 Acute maxillary sinusitis, unspecified: Secondary | ICD-10-CM

## 2018-01-16 DIAGNOSIS — F900 Attention-deficit hyperactivity disorder, predominantly inattentive type: Secondary | ICD-10-CM | POA: Diagnosis not present

## 2018-01-16 DIAGNOSIS — F411 Generalized anxiety disorder: Secondary | ICD-10-CM | POA: Diagnosis not present

## 2018-01-16 MED ORDER — AMOXICILLIN 875 MG PO TABS: 875.0000 mg | ORAL_TABLET | Freq: Two times a day (BID) | ORAL | 0 refills | Status: AC

## 2018-01-16 NOTE — Progress Notes (Signed)
OFFICE VISIT  01/16/2018   CC:  Chief Complaint  Patient presents with  . Cough   HPI:    Patient is a 37 y.o. Caucasian male who presents for respiratory complaints. Stuffy nose x 2 wks, then 3 d/a started getting fatigue, malaise, raw throat but pt says not really "sore", lots of sinus pressure, some PND cough.  Nasal mucous thick and sticky. No fever.  No facial pain or upper teeth pain.  Has been using afrin once daily.  Saline nasal spray and tylenol as well. No wheeze, chest tightness, or SOB.  NO body aches.  No rash.  His 2 y/o child recently started day care 3 wks ago. 37 y/o dx'd with strep in last 1-2d.    Past Medical History:  Diagnosis Date  . ADD (attention deficit disorder)   . Anxiety and depression   . Chronic prostatitis    chronic irritative voiding symptoms: pelvic PT via [facility] helping as of 07/2016  . Diverticulosis    with 'itis per pt report; however, review of old record CT reports shows mild SB enteritis with mesenteric adenopathy 06/2015; then 11/2014 CT abd/pelv done for urinary frequency and hematuria showed NORMAL abd/pelv  . IBS (irritable bowel syndrome)    diarr predom as per old PCP records  . Nephrolithiasis    Nonobstructive, noted on CT (1-2 mm)-has never passed a stone that he knows of.  . 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 LASIK Bilateral 2011    Outpatient Medications Prior to Visit  Medication Sig Dispense Refill  . ADDERALL XR 10 MG 24 hr capsule Take 10 mg by mouth daily.  0  . ALPRAZolam (XANAX) 0.25 MG tablet Take 1 tablet by mouth daily as needed. Reported on 02/20/2016  2  . ELMIRON 100 MG capsule Take 1 capsule by mouth 3 (three) times daily.  7  . scopolamine (TRANSDERM-SCOP) 1 MG/3DAYS Place 1 patch (1.5 mg total) onto the skin every 3 (three) days. 4 patch 1   No facility-administered medications prior to visit.     No Known  Allergies  ROS As per HPI  PE: Blood pressure 114/66, pulse 61, temperature 98.5 F (36.9 C), temperature source Oral, resp. rate 16, height 5' 11.5" (1.816 m), weight 150 lb 8 oz (68.3 kg), SpO2 100 %. VS: noted--normal. Gen: alert, NAD, NONTOXIC APPEARING. HEENT: eyes without injection, drainage, or swelling.  Ears: EACs clear, TMs with normal light reflex and landmarks.  Nose: Clear rhinorrhea, with some dried, crusty exudate adherent to mildly injected mucosa.  No purulent d/c.  No paranasal sinus TTP.  No facial swelling.  Throat and mouth without focal lesion.  No pharyngial swelling, erythema, or exudate.   Neck: supple, no LAD.   LUNGS: CTA bilat, nonlabored resps.   CV: RRR, no m/r/g. EXT: no c/c/e SKIN: no rash  LABS:    Chemistry      Component Value Date/Time   NA 142 07/29/2016 0803   K 4.6 07/29/2016 0803   CL 106 07/29/2016 0803   CO2 25 07/29/2016 0803   BUN 10 07/29/2016 0803   CREATININE 1.04 07/29/2016 0803      Component Value Date/Time   CALCIUM 9.7 07/29/2016 0803   ALKPHOS 47 07/29/2016 0803   AST 10 07/29/2016 0803   ALT 10 07/29/2016 0803   BILITOT 1.1 07/29/2016 0803       IMPRESSION AND PLAN:  Acute sinusitis: amoxil 875 bid x 10d. Continue saline nasal spray, limit afrin use, +fluids, rest, tylenol prn.  An After Visit Summary was printed and given to the patient.  FOLLOW UP: Return if symptoms worsen or fail to improve.  Signed:  Crissie Sickles, MD           01/16/2018

## 2018-01-20 DIAGNOSIS — M7541 Impingement syndrome of right shoulder: Secondary | ICD-10-CM | POA: Diagnosis not present

## 2018-01-20 DIAGNOSIS — M7502 Adhesive capsulitis of left shoulder: Secondary | ICD-10-CM | POA: Diagnosis not present

## 2018-01-20 DIAGNOSIS — M7542 Impingement syndrome of left shoulder: Secondary | ICD-10-CM | POA: Diagnosis not present

## 2018-01-20 DIAGNOSIS — F419 Anxiety disorder, unspecified: Secondary | ICD-10-CM | POA: Diagnosis not present

## 2018-02-02 DIAGNOSIS — F419 Anxiety disorder, unspecified: Secondary | ICD-10-CM | POA: Diagnosis not present

## 2018-02-10 DIAGNOSIS — G4489 Other headache syndrome: Secondary | ICD-10-CM

## 2018-02-10 HISTORY — DX: Other headache syndrome: G44.89

## 2018-02-16 DIAGNOSIS — F419 Anxiety disorder, unspecified: Secondary | ICD-10-CM | POA: Diagnosis not present

## 2018-03-01 DIAGNOSIS — F419 Anxiety disorder, unspecified: Secondary | ICD-10-CM | POA: Diagnosis not present

## 2018-03-10 ENCOUNTER — Encounter: Payer: Self-pay | Admitting: Family Medicine

## 2018-03-10 ENCOUNTER — Ambulatory Visit: Payer: BLUE CROSS/BLUE SHIELD | Admitting: Family Medicine

## 2018-03-10 VITALS — BP 101/66 | HR 79 | Temp 98.7°F | Resp 16 | Ht 71.5 in | Wt 148.4 lb

## 2018-03-10 DIAGNOSIS — G43909 Migraine, unspecified, not intractable, without status migrainosus: Secondary | ICD-10-CM

## 2018-03-10 DIAGNOSIS — J302 Other seasonal allergic rhinitis: Secondary | ICD-10-CM

## 2018-03-10 DIAGNOSIS — H1013 Acute atopic conjunctivitis, bilateral: Secondary | ICD-10-CM

## 2018-03-10 MED ORDER — RIZATRIPTAN BENZOATE 10 MG PO TABS: 10.0000 mg | ORAL_TABLET | ORAL | 1 refills | Status: DC | PRN

## 2018-03-10 NOTE — Patient Instructions (Signed)
Buy over the counter generic zaditor eye drops and take as directed on the packaging.

## 2018-03-10 NOTE — Progress Notes (Signed)
OFFICE VISIT  03/10/2018   CC:  Chief Complaint  Patient presents with  . Allergies    ?    HPI:    Patient is a 37 y.o. Caucasian male who presents for "allergies". Long history of allergies, has been allergy tested in remote past and was told "your allergic to everything". Taking zyrtec daily. This season sx's worse than ever before: has itchy and burning eyes, sneezing fits at times, mild nasal congestion. Mild clear nasal drainage.  No upper teeth pain.  No cough. No ST.  Uses thera-tears lubricating drops but no other eye drops.   Also causing daily "migraine" headaches for the last 2 weeks.  Describes severe frontal and peri-orbital/behind eyes HA, constant, worse as the day goes on and is eventually throbbing by evening.  Has to go to bed early. Upon awakening HA is gone, but comes back w/in an hour.  Mild nausea, no vomiting.  No phonophobia.  Light not particularly bothersome.  Takes 1 aleve qAM, then takes 200mg  ibup tab a few times a day---for the last 2 weeks. Has never had headaches with his allergies before.  No changes in vision or hearing.   Got eye exam--all good.    Has never had migraines in past. His mom has migraines.  Past Medical History:  Diagnosis Date  . ADD (attention deficit disorder)   . Anxiety and depression   . Chronic prostatitis    chronic irritative voiding symptoms: pelvic PT via [facility] helping as of 07/2016  . Diverticulosis    with 'itis per pt report; however, review of old record CT reports shows mild SB enteritis with mesenteric adenopathy 06/2015; then 11/2014 CT abd/pelv done for urinary frequency and hematuria showed NORMAL abd/pelv  . IBS (irritable bowel syndrome)    diarr predom as per old PCP records  . Nephrolithiasis    Nonobstructive, noted on CT (1-2 mm)-has never passed a stone that he knows of.  . 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  LASIK Bilateral 2011    Outpatient Medications Prior to Visit  Medication Sig Dispense Refill  . ADDERALL XR 10 MG 24 hr capsule Take 10 mg by mouth daily.  0  . ALPRAZolam (XANAX) 0.25 MG tablet Take 1 tablet by mouth daily as needed. Reported on 02/20/2016  2  . ELMIRON 100 MG capsule Take 1 capsule by mouth 3 (three) times daily.  7  . scopolamine (TRANSDERM-SCOP) 1 MG/3DAYS Place 1 patch (1.5 mg total) onto the skin every 3 (three) days. (Patient not taking: Reported on 03/10/2018) 4 patch 1   No facility-administered medications prior to visit.     No Known Allergies  ROS As per HPI  PE: Blood pressure 101/66, pulse 79, temperature 98.7 F (37.1 C), temperature source Oral, resp. rate 16, height 5' 11.5" (1.816 m), weight 148 lb 6 oz (67.3 kg), SpO2 99 %. VS: noted--normal. Gen: alert, NAD, WELL-APPEARING. HEENT: eyes without injection, drainage, or swelling.  Ears: EACs clear, TMs with normal light reflex and landmarks.  Nose: Scant clear rhinorrhea with mildly injected and edematous turbinates.  No purulent d/c.  No paranasal sinus TTP.  No facial swelling.  Throat and mouth without focal lesion.  No pharyngial swelling, erythema, or exudate.   Neck: supple, no LAD.   LUNGS: CTA bilat, nonlabored resps.   CV: RRR, no m/r/g. EXT: no c/c/e SKIN: no rash Neuro: CN 2-12  intact bilaterally, strength 5/5 in proximal and distal upper extremities and lower extremities bilaterally.  No tremor.  FNF normal bilat.  No ataxia.  Upper extremity and lower extremity DTRs symmetric.  No pronator drift.     LABS:    Chemistry      Component Value Date/Time   NA 142 07/29/2016 0803   K 4.6 07/29/2016 0803   CL 106 07/29/2016 0803   CO2 25 07/29/2016 0803   BUN 10 07/29/2016 0803   CREATININE 1.04 07/29/2016 0803      Component Value Date/Time   CALCIUM 9.7 07/29/2016 0803   ALKPHOS 47 07/29/2016 0803   AST 10 07/29/2016 0803   ALT 10 07/29/2016 0803   BILITOT 1.1 07/29/2016 0803       IMPRESSION AND PLAN:  1) Migraine syndrome (he doesn't seem to have significant-enough allergic rhinitis sx's to suspect acute sinusitis or simply a "sinus HA".   Plan: trial of maxalt 10mg  qd prn, repeat in 2 hours if HA not significantly improved. Stop NSAIDs for now.  2) Allergic rhinitis with bilat allergic conjunctivitis: switch antihistamines q57mo. Buy over the counter generic zaditor eye drops and take as directed on the packaging.  An After Visit Summary was printed and given to the patient.  FOLLOW UP: Return if symptoms worsen or fail to improve.  Signed:  Crissie Sickles, MD           03/10/2018

## 2018-03-14 DIAGNOSIS — F419 Anxiety disorder, unspecified: Secondary | ICD-10-CM | POA: Diagnosis not present

## 2018-03-29 DIAGNOSIS — F419 Anxiety disorder, unspecified: Secondary | ICD-10-CM | POA: Diagnosis not present

## 2018-03-30 DIAGNOSIS — H47393 Other disorders of optic disc, bilateral: Secondary | ICD-10-CM | POA: Diagnosis not present

## 2018-03-30 DIAGNOSIS — H1045 Other chronic allergic conjunctivitis: Secondary | ICD-10-CM | POA: Diagnosis not present

## 2018-03-30 DIAGNOSIS — J301 Allergic rhinitis due to pollen: Secondary | ICD-10-CM | POA: Diagnosis not present

## 2018-03-30 DIAGNOSIS — J3081 Allergic rhinitis due to animal (cat) (dog) hair and dander: Secondary | ICD-10-CM | POA: Diagnosis not present

## 2018-03-30 DIAGNOSIS — H01001 Unspecified blepharitis right upper eyelid: Secondary | ICD-10-CM | POA: Diagnosis not present

## 2018-03-30 DIAGNOSIS — H5371 Glare sensitivity: Secondary | ICD-10-CM | POA: Diagnosis not present

## 2018-03-30 DIAGNOSIS — H53143 Visual discomfort, bilateral: Secondary | ICD-10-CM | POA: Diagnosis not present

## 2018-03-30 DIAGNOSIS — J3089 Other allergic rhinitis: Secondary | ICD-10-CM | POA: Diagnosis not present

## 2018-04-05 DIAGNOSIS — J301 Allergic rhinitis due to pollen: Secondary | ICD-10-CM | POA: Diagnosis not present

## 2018-04-05 DIAGNOSIS — J3081 Allergic rhinitis due to animal (cat) (dog) hair and dander: Secondary | ICD-10-CM | POA: Diagnosis not present

## 2018-04-06 ENCOUNTER — Encounter: Payer: Self-pay | Admitting: Family Medicine

## 2018-04-06 ENCOUNTER — Ambulatory Visit: Payer: BLUE CROSS/BLUE SHIELD | Admitting: Family Medicine

## 2018-04-06 VITALS — BP 101/70 | HR 89 | Resp 16 | Wt 149.0 lb

## 2018-04-06 DIAGNOSIS — G4459 Other complicated headache syndrome: Secondary | ICD-10-CM

## 2018-04-06 DIAGNOSIS — H4710 Unspecified papilledema: Secondary | ICD-10-CM | POA: Diagnosis not present

## 2018-04-06 DIAGNOSIS — J3089 Other allergic rhinitis: Secondary | ICD-10-CM | POA: Diagnosis not present

## 2018-04-06 NOTE — Progress Notes (Signed)
OFFICE VISIT  04/06/2018   CC:  Chief Complaint  Patient presents with  . Headache    with nausea   HPI:    Patient is a 37 y.o. Caucasian male who presents for 1 mo f/u headaches. When I initially saw him 1 mo ago I felt like these were an atypical migraine syndrome and rx'd a trial of maxalt as abortive med and told him to stop use of NSAIDs. Also discussed q3 mo rotation of nonsedating antihistamines for allerg rhin + get zaditor eye gtts--he felt possibly his HA's were "sinus headaches". Rizatriptan did not help.   He has continued to have HAs and retrobulbar pain, pain worse with trying to focus his vision.  Things getting worse--daily/intractible HA.  No otc pain meds help.  Vision symptoms: no flashing lights, no blurry vision, no double vision, no visual field deficits. Hears heartbeat in his ear but says he has always had this.  Nausea with HA worsening.  No vomiting.  No focal weakness. +General malaise.  No fevers.  Has lots of stress the last several months with his kids being sick a lot.  No bowel/bladder dysfunction (other than his normal sx's from his IC and chronic prostatitis).  No dizziness.  No ataxia, no falls.  No confusion. He saw ophthalmologist 7 d/a, was told vision was 20/20, was then told that both optic nerve heads were enlarged.  Went to allergist---Dr. Meda Coffee told HAs likely not related to his allergies. He will be getting immunotherapy for his allergies, though. Dr. Harold Hedge also recommended he see a neurologist.   Past Medical History:  Diagnosis Date  . ADD (attention deficit disorder)   . Allergic rhinitis    + allerg conjunctivitis  . Anxiety and depression   . Chronic prostatitis    chronic irritative voiding symptoms: pelvic PT via [facility] helping as of 07/2016  . Diverticulosis    with 'itis per pt report; however, review of old record CT reports shows mild SB enteritis with mesenteric adenopathy 06/2015; then 11/2014 CT  abd/pelv done for urinary frequency and hematuria showed NORMAL abd/pelv  . IBS (irritable bowel syndrome)    diarr predom as per old PCP records  . Migraine syndrome 02/2018  . Nephrolithiasis    Nonobstructive, noted on CT (1-2 mm)-has never passed a stone that he knows of.  . 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 LASIK Bilateral 2011    Outpatient Medications Prior to Visit  Medication Sig Dispense Refill  . ADDERALL XR 10 MG 24 hr capsule Take 10 mg by mouth daily.  0  . ELMIRON 100 MG capsule Take 1 capsule by mouth 3 (three) times daily.  7  . fexofenadine (ALLEGRA) 180 MG tablet Take 180 mg by mouth daily.    . fluticasone (FLONASE) 50 MCG/ACT nasal spray Place into both nostrils daily.    . hydrocortisone 2.5 % cream APPLY TO AFFECTED AREA TWICE A DAY AS NEEDED EXTERNALLY 30 DAYS  3  . hydrOXYzine (ATARAX/VISTARIL) 10 MG tablet TAKE 1 TO 2 TABLETS BY MOUTH IN THE EVENING  3  . rizatriptan (MAXALT) 10 MG tablet Take 1 tablet (10 mg total) by mouth as needed for migraine. May repeat in 2 hours if needed 10 tablet 1  . ALPRAZolam (XANAX) 0.25 MG tablet Take 1 tablet by mouth daily as needed. Reported on 02/20/2016  2   No facility-administered medications  prior to visit.     Allergies  Allergen Reactions  . Prednisone Other (See Comments)    Severe anxiety, cognitive impairment, "hallucinations"---"I never want to take that med again"    ROS As per HPI  PE: Blood pressure 101/70, pulse 89, resp. rate 16, weight 149 lb (67.6 kg), SpO2 98 %. Gen: Alert, well appearing.  Patient is oriented to person, place, time, and situation. AFFECT: pleasant, lucid thought and speech. CV: RRR, no m/r/g.   LUNGS: CTA bilat, nonlabored resps, good aeration in all lung fields. Neuro: CN 2-12 intact bilaterally, strength 5/5 in proximal and distal upper extremities and lower extremities bilaterally.  No sensory  deficits.  No tremor.  No disdiadochokinesis.  No ataxia.  Upper extremity and lower extremity DTRs symmetric.  No pronator drift. EOMI, PERRL, no visual field deficits. Ophthalmoscopic exam: normal retinal vessels, question of complete absence of optic disc margins bilaterally.  LABS:  None today.    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 I feel like today's exam of his optic discs is consistent with ophthalmologist's findings. Fortunately he has no vision deficits. This is all concerning for pseudotumor cerebri, but also could be cerebral venous thrombosis or brain mass. Urgent neurology referral ordered and MRI brain w and w/out contrast ordered today (ASAP). Stop all otc meds for pain, stop maxalt.  Offered pt opioid pain med but he declined at this time.  Will obtain records from [facility] eye surgeons.  An After Visit Summary was printed and given to the patient.  FOLLOW UP: Return for to be determined based on workup and specialist evaluation.  Signed:  Crissie Sickles, MD           04/06/2018

## 2018-04-08 ENCOUNTER — Ambulatory Visit (HOSPITAL_BASED_OUTPATIENT_CLINIC_OR_DEPARTMENT_OTHER)
Admission: RE | Admit: 2018-04-08 | Discharge: 2018-04-08 | Disposition: A | Discharge status: Home / Self Care | Payer: BLUE CROSS/BLUE SHIELD | Source: Ambulatory Visit | Attending: Family Medicine | Admitting: Family Medicine

## 2018-04-08 DIAGNOSIS — H4710 Unspecified papilledema: Secondary | ICD-10-CM | POA: Insufficient documentation

## 2018-04-08 DIAGNOSIS — G44001 Cluster headache syndrome, unspecified, intractable: Secondary | ICD-10-CM | POA: Diagnosis not present

## 2018-04-08 DIAGNOSIS — G4459 Other complicated headache syndrome: Secondary | ICD-10-CM | POA: Insufficient documentation

## 2018-04-08 MED ORDER — GADOBENATE DIMEGLUMINE 529 MG/ML IV SOLN
10.0000 mL | Freq: Once | INTRAVENOUS | Status: AC | PRN
  Administered 2018-04-08: 10 mL via INTRAVENOUS

## 2018-04-10 ENCOUNTER — Encounter: Payer: Self-pay | Admitting: Family Medicine

## 2018-04-11 DIAGNOSIS — J3089 Other allergic rhinitis: Secondary | ICD-10-CM | POA: Diagnosis not present

## 2018-04-11 DIAGNOSIS — J301 Allergic rhinitis due to pollen: Secondary | ICD-10-CM | POA: Diagnosis not present

## 2018-04-11 DIAGNOSIS — J3081 Allergic rhinitis due to animal (cat) (dog) hair and dander: Secondary | ICD-10-CM | POA: Diagnosis not present

## 2018-04-11 DIAGNOSIS — F419 Anxiety disorder, unspecified: Secondary | ICD-10-CM | POA: Diagnosis not present

## 2018-04-13 ENCOUNTER — Encounter: Payer: Self-pay | Admitting: Neurology

## 2018-04-13 ENCOUNTER — Ambulatory Visit: Payer: BLUE CROSS/BLUE SHIELD | Admitting: Neurology

## 2018-04-13 ENCOUNTER — Other Ambulatory Visit: Payer: Self-pay

## 2018-04-13 VITALS — BP 116/70 | HR 70 | Resp 16 | Ht 72.0 in | Wt 150.5 lb

## 2018-04-13 DIAGNOSIS — F419 Anxiety disorder, unspecified: Secondary | ICD-10-CM | POA: Diagnosis not present

## 2018-04-13 DIAGNOSIS — H4710 Unspecified papilledema: Secondary | ICD-10-CM

## 2018-04-13 DIAGNOSIS — G4452 New daily persistent headache (NDPH): Secondary | ICD-10-CM | POA: Diagnosis not present

## 2018-04-13 DIAGNOSIS — F32A Depression, unspecified: Secondary | ICD-10-CM

## 2018-04-13 DIAGNOSIS — F329 Major depressive disorder, single episode, unspecified: Secondary | ICD-10-CM | POA: Diagnosis not present

## 2018-04-13 DIAGNOSIS — H539 Unspecified visual disturbance: Secondary | ICD-10-CM | POA: Insufficient documentation

## 2018-04-13 MED ORDER — IMIPRAMINE HCL 25 MG PO TABS: 25.0000 mg | ORAL_TABLET | Freq: Every day | ORAL | 5 refills | Status: DC

## 2018-04-13 NOTE — Progress Notes (Signed)
[facility]  PATIENT: Bernard Dalton DOB: Jan 28, 1981  REFERRING DOCTOR OR PCP:  Deitra Mayo, PA-C/Patrick Larose Kells, MD (ophth); Dr. Shawnie Dapper (PCP) SOURCE: Patient, notes from PCP and ophthalmology, MRI results, MRI images on PACS.  _________________________________   HISTORICAL  CHIEF COMPLAINT:  Chief Complaint  Patient presents with  . Headache    Headache onset 44mos ago.  Standing relieves h/a.  No relief with otc meds or Maxalt.  Has seen opthalmology and has that report with him.  MRI brain done 04/09/18/fim  . Papilledema  . Discharge Home    Pt. has been lying down for about an hour since LP was done.  Feels ok.  131/62-65-16. Skin w/dry/pink. Alert and oriented times 4.  Gave instruction on rest, hydration, caffeine today, and possibility of LP h/a.Marland Kitchen  He will call back if h/a changes in nature, character, becomes severe with standing (to date, standing has relieved his current h/a). To lab and then d/c home in care of wife,/fim    HISTORY OF PRESENT ILLNESS:  I had the pleasure seeing the patient's, Bernard Dalton, at [facility] for neurologic consultation regarding his headaches and papilledema.  For the past 2 months, he has had daily headaches.  Pain is behind his eyes, left = right.  When pain is worsening, he gets nauseous.   Changing gaze does not affect the pain.    The headaches are mild in the morning and worsen as the day goes on.    Also worsens with looking at a computer or other screen.     Pain is worse when sitting a longer time and better with standing.    When sitting a while he feels that the vision worsens. Moving his head does not change intensity.   When more severe he gets nausea but no photophobia or phonophobia.   He has mild neck pain at times. He notes no change in color vision.   He denies any diplopia.  Sometimes he notes minimal tinnitus.  He does not have blackening of his vision or presyncope upon standing up.   Initially, migraines were suspected and rizatriptan was tried without.    Notes from Dr. [name] were reviewed.    Because the headaches would be associated with visual changes were more intense, he saw his ophthalmologist.  I reviewed the notes.  Visual acuity was 20/20 with glasses.  External and internal ocular exam was normal.  Specifically, the optic nerve appeared to be normal.  However, OCT showed optic nerve bulging worse on 4 papilledema.  Formal visual field testing was not done.     He also saw an allergist, Dr. [name], and was told that the headaches were unlikely to be related to his seasonal allergies.    Due to continuing symptoms and the possibility of papilledema, he is referred for further evaluation.  I personally reviewed the MRI images of the brain with and without contrast and reviewed the report from 04/08/2018.   The brain is essentially normal.  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    I did not note any changes in the pituitary gland of the sella turcica.  Additionally the optic nerve sheaths had normal diameter.   Many patients with elevated intracranial pressure will have a flattened pituitary gland, enlarged sella turcica and/or widened optic nerve sheaths.  He denies numbness, weakness or clumsiness.  He has mild neck and back pain and was told he has kyphosis.   However, no change in intensity recently.  He has no cognitive change in general but last month had one episode where he felt he was a little confused.   He has ADD helped by Adderall  He had urinary urgency a few years ago and sore urology.  He was diagnosed with a mildly enlarged prostate and interstitial cystitis.  He takes Elmiron.   He gets more anxious when urgency is present and takes Xanax if symptoms are worse.    REVIEW OF SYSTEMS: Constitutional: No fevers, chills, sweats, or change in  appetite Eyes: No visual changes, double vision, eye pain Ear, nose and throat: No hearing loss, ear pain, nasal congestion, sore throat Cardiovascular: No chest pain, palpitations Respiratory: No shortness of breath at rest or with exertion.   No wheezes GastrointestinaI: No nausea, vomiting, diarrhea, abdominal pain, fecal incontinence Genitourinary:He has had urinary urgency and frequency.  He was diagnosed with interstitial cystitis and is doing better on Elmiron.  Hydroxyzine helps his urinary symptoms at night..   He also has a history of small kidney stones. Musculoskeletal:Notes occasional lower back pain.  There is mild neck pain.   Integumentary: No rash, pruritus, skin lesions Neurological: as above Psychiatric: Denies depression.  Notes some anxiety. Endocrine: No palpitations, diaphoresis, change in appetite, change in weigh or increased thirst Hematologic/Lymphatic: No anemia, purpura, petechiae. Allergic/Immunologic: He has seasonal allergies.   ALLERGIES: Allergies  Allergen Reactions  . Prednisone Other (See Comments)    Severe anxiety, cognitive impairment, "hallucinations"---"I never want to take that med again"    HOME MEDICATIONS:  Current Outpatient Medications:  .  ADDERALL XR 10 MG 24 hr capsule, Take 10 mg by mouth daily., Disp: , Rfl: 0 .  ALPRAZolam (XANAX) 0.25 MG tablet, Take 1 tablet by mouth daily as needed. Reported on 02/20/2016, Disp: , Rfl: 2 .  ELMIRON 100 MG capsule, Take 1 capsule by mouth 3 (three) times daily., Disp: , Rfl: 7 .  fexofenadine (ALLEGRA) 180 MG tablet, Take 180 mg by mouth daily., Disp: , Rfl:  .  fluticasone (FLONASE) 50 MCG/ACT nasal spray, Place into both nostrils daily., Disp: , Rfl:  .  hydrOXYzine (ATARAX/VISTARIL) 10 MG tablet, TAKE 1 TO 2 TABLETS BY MOUTH IN THE EVENING, Disp: , Rfl: 3 .  hydrocortisone 2.5 % cream, APPLY TO AFFECTED AREA TWICE A DAY AS NEEDED EXTERNALLY 30 DAYS, Disp: , Rfl: 3 .  imipramine (TOFRANIL)  25 MG tablet, Take 1 tablet (25 mg total) by mouth at bedtime., Disp: 30 tablet, Rfl: 5  PAST MEDICAL HISTORY: Past Medical History:  Diagnosis Date  . ADD (attention deficit disorder)   . Allergic rhinitis    + allerg conjunctivitis  . Anxiety and depression   . Chronic prostatitis    chronic irritative voiding symptoms: pelvic PT via [facility] helping as of 07/2016  . Diverticulosis    with 'itis per pt report; however, review of old record CT reports shows mild SB enteritis with mesenteric adenopathy 06/2015; then 11/2014 CT abd/pelv done for urinary frequency and hematuria showed NORMAL abd/pelv  . Headache syndrome 02/2018   03/2018 MRI normal: ? papilledema on optho exam.  Pt referred to neurologist.  . IBS (irritable bowel syndrome)    diarr predom as per old PCP records  . Nephrolithiasis    Nonobstructive, noted on CT (1-2 mm)-has never passed a stone that he knows of.  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 Vision abnormalities     PAST SURGICAL HISTORY: Past Surgical History:  Procedure Laterality Date  . LASIK Bilateral 2011  . REFRACTIVE SURGERY Bilateral     FAMILY HISTORY: Family History  Problem Relation Age of Onset  . Alcohol abuse Mother   . Alcohol abuse Father   . Kidney disease Father   . Alcohol abuse Maternal Grandmother   . Alcohol abuse Maternal Grandfather   . Stroke Paternal Grandmother   . Congestive Heart Failure Paternal Grandmother   . Alcohol abuse Paternal Grandmother   . Alcohol abuse Paternal Grandfather   . ADD / ADHD Brother   . ADD / ADHD Brother     SOCIAL HISTORY:  Social History   Socioeconomic History  . Marital status: Married    Spouse name: Not on file  . Number of children: Not on file  . Years of education: Not on file  . Highest education level: Not on file  Occupational History  . Not on file  Social Needs  . Financial resource strain: Not on file  . Food insecurity:    Worry:  Not on file    Inability: Not on file  . Transportation needs:    Medical: Not on file    Non-medical: Not on file  Tobacco Use  . Smoking status: Never Smoker  . Smokeless tobacco: Never Used  Substance and Sexual Activity  . Alcohol use: No  . Drug use: No  . Sexual activity: Not on file  Lifestyle  . Physical activity:    Days per week: Not on file    Minutes per session: Not on file  . Stress: Not on file  Relationships  . Social connections:    Talks on phone: Not on file    Gets together: Not on file    Attends religious service: Not on file    Active member of club or organization: Not on file    Attends meetings of clubs or organizations: Not on file    Relationship status: Not on file  . Intimate partner violence:    Fear of current or ex partner: Not on file    Emotionally abused: Not on file    Physically abused: Not on file    Forced sexual activity: Not on file  Other Topics Concern  . Not on file  Social History Narrative   Married, 1 child and 1 on the way.   Relocated from [facility] to [facility] 08/2015.   Educ: college at [employer].   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   No T/A/Ds.     PHYSICAL EXAM  Vitals:   04/13/18 0855  BP: 116/70  Pulse: 70  Resp: 16  Weight: 150 lb 8 oz (68.3 kg)  Height: 6' (1.829 m)    Body mass index is 20.41 kg/m.   General: The patient is well-developed and well-nourished and in no acute distress  Eyes:  Funduscopic exam shows normal optic discs and retinal vessels.   Venous pulsations are present.  He is is is worse with epic and any other when I see him the medicine for 3-year is is seen 6 or 7 Dr.  Lavetta Nielsen: The neck is supple, no carotid bruits are noted.  The occiput is mildly notender.  Cardiovascular: The heart has a regular rate and rhythm with a normal S1 and S2. There were no murmurs, gallops or rubs.    Skin:  Extremities are without significant edema.  Musculoskeletal:  Mild pes cavus.  Back is  nontender  Neurologic Exam  Mental status: The patient is alert and oriented x 3 at the time of the examination. The patient has apparent normal recent and remote memory, with an apparently normal attention span and concentration ability.   Speech is normal.  Cranial nerves: Extraocular movements are full. Pupils are equal, round, and reactive to light and accomodation.  Visual fields are full.  Facial symmetry is present. There is good facial sensation to soft touch bilaterally.Facial strength is normal.  Trapezius and sternocleidomastoid strength is normal. No dysarthria is noted.  The tongue is midline, and the patient has symmetric elevation of the soft palate. No obvious hearing deficits are noted.  Motor:  Muscle bulk is normal.   Tone is normal. Strength is  5 / 5 in all 4 extremities.   Sensory: Sensory testing is intact to pinprick, soft touch and vibration sensation in all 4 extremities.  Coordination: Cerebellar testing reveals good finger-nose-finger and heel-to-shin bilaterally.  Gait and station: Station is normal.   Gait is normal. Tandem gait is normal. Romberg is negative.   Reflexes: Deep tendon reflexes are symmetric and normal bilaterally.   Plantar responses are flexor.    DIAGNOSTIC DATA (LABS, IMAGING, TESTING) - I reviewed patient records, labs, notes, testing and imaging myself where available.  Lab Results  Component Value Date   WBC 4.3 07/29/2016   HGB 15.0 07/29/2016   HCT 44.3 07/29/2016   MCV 87.5 07/29/2016   PLT 178 07/29/2016      Component Value Date/Time   NA 142 07/29/2016 0803   K 4.6 07/29/2016 0803   CL 106 07/29/2016 0803   CO2 25 07/29/2016 0803   GLUCOSE 96 07/29/2016 0803   BUN 10 07/29/2016 0803   CREATININE 1.04 07/29/2016 0803   CALCIUM 9.7 07/29/2016 0803   PROT 6.3 07/29/2016 0803   ALBUMIN 4.3 07/29/2016 0803   AST 10 07/29/2016 0803   ALT 10 07/29/2016 0803   ALKPHOS 47 07/29/2016 0803   BILITOT 1.1 07/29/2016 0803    No results found for: CHOL, HDL, LDLCALC, LDLDIRECT, TRIG, CHOLHDL Lab Results  Component Value Date   HGBA1C 4.7 07/29/2016   No results found for: VITAMINB12 Lab Results  Component Value Date   TSH 1.31 07/29/2016    ____________________________________  LUMBAR PUNCTURE The rationale and risks of lumbar puncture was discussed with Mr. Vonstein.  He opted to proceed with the procedure.  He was placed in the right lateral decubitus position with knees tucked up towards the chest.  The back was prepped and draped.  The L2-L3 interspace was identified and numbed with 1% lidocaine.  A 20-gauge Quincke tip spinal needle was advanced into the subarachnoid space.  Clear fluid returned.  Opening pressure was 185 mm.     6 cc spinal fluid was removed to send for studies.  Closing pressure was 140 mm.  The needle was removed.  He remained laying on either his side or back for the next hour before being discharged.  There were no complications.     ASSESSMENT AND PLAN  Papilledema - Plan: Sedimentation rate, ANA w/Reflex, Protein, CSF, Cell Count, CSF, Angiotensin converting enzyme, CSF, Glucose, CSF  New persistent daily headache  Vision disturbance  Anxiety and depression   In summary, Mr. Fullington is a 37 year old man with a 61-month history of daily headaches and altered vision when the headaches intensify.  On recent  ophthalmologic evaluation, he was noted on OCT to have optic disc edema..  On my examination today, I did not note any papilledema.  Venous pulsations were present.  The MRI of the brain was normal.  I did not note changes typical for elevated intracranial pressure (empty sella, widened optic nerve sheaths).  Despite the MRI, I am most concerned about the possibility of elevated intracranial pressure.  A lumbar puncture was performed.  Opening pressure was normal at 185 mm.  I will send some CSF for standard labs and ACE.    Ischemic optic neuropathy can also cause optic nerve  changes though this would be unlikely with 20/20 vision.  We will also check some blood work for vasculitis.    I will hold off on treating with Diamox as intracranial pressure was normal.  Also he has a history of kidney stones and the risk would be significantly higher on Diamox.  Hopefully the headaches will improve after the lumbar puncture.  He will also start imipramine 25 mg nightly as that may help if there is a tension type headache component.  We will contact him with the results of the studies.  He will see me back for regular follow-up in 6 weeks but sooner if there are new or worsening symptoms.  He also has an appointment with his ophthalmologist around that time.  Thank you for asking me to see Mr. Gonyer.  Please let me know if I can be of further assistance with him or other patients in the future.  Sigifredo Pignato A. [name], MD, [facility] 5/0/3546, 56:81 AM Certified in Neurology, Clinical Neurophysiology, Sleep Medicine, Pain Medicine and Neuroimaging  [facility] [address], [place], [place] [address] [phone]

## 2018-04-14 LAB — CELL COUNT, CSF
NUC CELL # CSF: 1 {cells}/uL (ref 0–5)
RBC COUNT CSF: 0 /uL

## 2018-04-14 LAB — SEDIMENTATION RATE: Sed Rate: 2 mm/hr (ref 0–15)

## 2018-04-14 LAB — ANA W/REFLEX: ANA: NEGATIVE

## 2018-04-17 ENCOUNTER — Encounter: Payer: Self-pay | Admitting: Family Medicine

## 2018-04-17 ENCOUNTER — Ambulatory Visit: Payer: BLUE CROSS/BLUE SHIELD | Admitting: Family Medicine

## 2018-04-17 VITALS — BP 114/73 | HR 71 | Temp 97.9°F | Resp 16 | Ht 72.0 in | Wt 152.0 lb

## 2018-04-17 DIAGNOSIS — R51 Headache: Secondary | ICD-10-CM | POA: Diagnosis not present

## 2018-04-17 DIAGNOSIS — R519 Headache, unspecified: Secondary | ICD-10-CM

## 2018-04-17 LAB — ANGIOTENSIN CONVERTING ENZYME, CSF: ACE, CSF: 0 U/L (ref 0.0–2.8)

## 2018-04-17 LAB — GLUCOSE, CSF: GLUCOSE CSF: 59 mg/dL (ref 40–70)

## 2018-04-17 LAB — PROTEIN, CSF: TOTAL PROTEIN, CSF: 40.8 mg/dL (ref 0.0–44.0)

## 2018-04-17 NOTE — Progress Notes (Signed)
OFFICE VISIT  04/17/2018   CC:  Chief Complaint  Patient presents with  . Follow-up    Headache   HPI:    Patient is a 37 y.o. 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 Since I last saw him we did an MRI brain and had neurologist, Dr. Kerman Passey, see him for worry of increased intracranial pressure (papilledema at ophthalmologist's office). This eval turned up no papilledema on his exam, and LP opening pressure was normal.  MRI brain normal. Still no clear reason for his HAs. Some vasculitis labs were obtained--ESR, ANA normal.  Some CSF testing was done: glucose, protein, cell ct/diff---ALL NORMAL (ACE level pending).   He was started on night-time imipramine 40m in case he has component of chronic tension HAs.  Plan for neuro f/u 6 wks.  Imipramine has only been taken a couple of times since he saw the neurologist and  pt says it is making him feel like he can't sleep: keeps waking up frequently.  No help with HAs so far.  He says he does feel like when he tries to focus the HA comes on and gradually gets worse.  Says that when he is standing and keeping busy he is fine.  Says when he sits and looks at something like a computer or phone screen and focuses the HA starts. Standing: HA better/gone. Sitting: HA comes/worse.  Optho exam recently normal except for the questionable papilledema.  Past Medical History:  Diagnosis Date  . ADD (attention deficit disorder)   . Allergic rhinitis    + allerg conjunctivitis  . Anxiety and depression   . Chronic prostatitis    chronic irritative voiding symptoms: pelvic PT via [facility] helping as of 07/2016  . Diverticulosis    with 'itis per pt report; however, review of old record CT reports shows mild SB enteritis with mesenteric adenopathy 06/2015; then 11/2014 CT abd/pelv done for urinary frequency and hematuria showed NORMAL abd/pelv  . Headache syndrome 02/2018   03/2018  MRI normal: ? papilledema on optho exam.  Dr. SFelecia Shellingwith neuro saw him 04/2018 and opening CSF pressure was normal.  Some labs being done.  No clear reason for HA's found  . IBS (irritable bowel syndrome)    diarr predom as per old PCP records  . Nephrolithiasis    Nonobstructive, noted on CT (1-2 mm)-has never passed a stone that he knows of.  . 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 Vision abnormalities     Past Surgical History:  Procedure Laterality Date  . LASIK Bilateral 2011  . REFRACTIVE SURGERY Bilateral     Outpatient Medications Prior to Visit  Medication Sig Dispense Refill  . ADDERALL XR 10 MG 24 hr capsule Take 10 mg by mouth daily.  0  . ALPRAZolam (XANAX) 0.25 MG tablet Take 1 tablet by mouth daily as needed. Reported on 02/20/2016  2  . ELMIRON 100 MG capsule Take 1 capsule by mouth 3 (three) times daily.  7  . fexofenadine (ALLEGRA) 180 MG tablet Take 180 mg by mouth daily.    . fluticasone (FLONASE) 50 MCG/ACT nasal spray Place into both nostrils daily.    . hydrocortisone 2.5 % cream APPLY TO AFFECTED AREA TWICE A DAY AS NEEDED EXTERNALLY 30 DAYS  3  . hydrOXYzine (ATARAX/VISTARIL) 10 MG tablet TAKE 1 TO 2 TABLETS BY MOUTH IN THE EVENING  3  . imipramine (TOFRANIL) 25 MG tablet  Take 1 tablet (25 mg total) by mouth at bedtime. 30 tablet 5   No facility-administered medications prior to visit.     Allergies  Allergen Reactions  . Prednisone Other (See Comments)    Severe anxiety, cognitive impairment, "hallucinations"---"I never want to take that med again"    ROS As per HPI  PE: Blood pressure 114/73, pulse 71, temperature 97.9 F (36.6 C), temperature source Oral, resp. rate 16, height 6' (1.829 m), weight 152 lb (68.9 kg), SpO2 99 %. Gen: Alert, well appearing.  Patient is oriented to person, place, time, and situation. AFFECT: pleasant, lucid thought and speech. No further exam today.  LABS:  None today  IMPRESSION AND  PLAN:  Intractable headaches, unknown etiology. Recent MRI + w/u for possible pseudotumor cerebri was NEGATIVE.   Still perplexing that HA's are so closely tied to sitting down and to looking at a screen (yet recent vision exam at ophtho was fine). If pt fails imipramine trial over the next 10-14d then I will attempt a trial of propranolol.   Also considering use of topamax.  An After Visit Summary was printed and given to the patient.  FOLLOW UP: Return for as needed.  Signed:  Crissie Sickles, MD           04/17/2018

## 2018-04-18 ENCOUNTER — Telehealth: Payer: Self-pay | Admitting: *Deleted

## 2018-04-18 ENCOUNTER — Ambulatory Visit: Payer: BLUE CROSS/BLUE SHIELD | Admitting: Diagnostic Neuroimaging

## 2018-04-18 NOTE — Telephone Encounter (Signed)
-----   Message from Britt Bottom, MD sent at 04/17/2018  6:06 PM EDT ----- Please let the patient know that the CSF lab work is fine.

## 2018-04-18 NOTE — Telephone Encounter (Signed)
Spoke with pt. and reviewed below lab results. He verbalized understanding of same/fim 

## 2018-04-19 DIAGNOSIS — F329 Major depressive disorder, single episode, unspecified: Secondary | ICD-10-CM | POA: Diagnosis not present

## 2018-04-19 DIAGNOSIS — F900 Attention-deficit hyperactivity disorder, predominantly inattentive type: Secondary | ICD-10-CM | POA: Diagnosis not present

## 2018-04-19 DIAGNOSIS — F411 Generalized anxiety disorder: Secondary | ICD-10-CM | POA: Diagnosis not present

## 2018-04-20 ENCOUNTER — Encounter

## 2018-04-20 ENCOUNTER — Ambulatory Visit: Payer: BLUE CROSS/BLUE SHIELD | Admitting: Neurology

## 2018-04-24 ENCOUNTER — Telehealth: Payer: Self-pay | Admitting: Family Medicine

## 2018-04-24 DIAGNOSIS — G44221 Chronic tension-type headache, intractable: Secondary | ICD-10-CM

## 2018-04-24 DIAGNOSIS — M542 Cervicalgia: Secondary | ICD-10-CM | POA: Diagnosis not present

## 2018-04-24 DIAGNOSIS — G8929 Other chronic pain: Secondary | ICD-10-CM

## 2018-04-24 DIAGNOSIS — M545 Low back pain: Secondary | ICD-10-CM | POA: Diagnosis not present

## 2018-04-24 DIAGNOSIS — R51 Headache: Secondary | ICD-10-CM | POA: Diagnosis not present

## 2018-04-24 DIAGNOSIS — G959 Disease of spinal cord, unspecified: Secondary | ICD-10-CM | POA: Diagnosis not present

## 2018-04-24 MED ORDER — CYCLOBENZAPRINE HCL 10 MG PO TABS: 10.0000 mg | ORAL_TABLET | Freq: Three times a day (TID) | ORAL | 1 refills | Status: DC | PRN

## 2018-04-24 NOTE — Telephone Encounter (Signed)
Please advise. Thanks.  

## 2018-04-24 NOTE — Telephone Encounter (Signed)
Copied from [place] [id number]. Topic: Referral - Request >> Apr 24, 2018  2:44 PM Margot Ables wrote: Reason for CRM: pt is requesting referral for the headaches/nausea he has been having. He is requesting dry needling for PT. He would like to go to [facility]. He is also requesting a muscle relaxer for temporary relief (flexeril).  [id number] - [place], [place] (Phone) [phone] (Fax)

## 2018-04-24 NOTE — Telephone Encounter (Signed)
I don't know that [facility] PT (or any PT) will do dry needling. Has he called [facility] and inquired about this already?  Let me know. I really don't know who would do this procedure--maybe a chiropractor or neurologist or acupuncturist. I sent in the flexeril.

## 2018-04-25 NOTE — Telephone Encounter (Signed)
OK, good to know. PT order entered as per pt request.-thx

## 2018-04-25 NOTE — Telephone Encounter (Signed)
SW pt, he stated that he saw on their website that they did dry needling but he will call and check with them to confirm. He will let us know if they do.

## 2018-04-25 NOTE — Telephone Encounter (Signed)
Patient notified and verbalized understanding. 

## 2018-04-25 NOTE — Telephone Encounter (Signed)
Pt called back and said that [facility] PT does do dry needling.

## 2018-04-27 DIAGNOSIS — F419 Anxiety disorder, unspecified: Secondary | ICD-10-CM | POA: Diagnosis not present

## 2018-04-28 DIAGNOSIS — M542 Cervicalgia: Secondary | ICD-10-CM | POA: Diagnosis not present

## 2018-05-02 DIAGNOSIS — J3089 Other allergic rhinitis: Secondary | ICD-10-CM | POA: Diagnosis not present

## 2018-05-02 DIAGNOSIS — J301 Allergic rhinitis due to pollen: Secondary | ICD-10-CM | POA: Diagnosis not present

## 2018-05-02 DIAGNOSIS — J3081 Allergic rhinitis due to animal (cat) (dog) hair and dander: Secondary | ICD-10-CM | POA: Diagnosis not present

## 2018-05-03 DIAGNOSIS — M542 Cervicalgia: Secondary | ICD-10-CM | POA: Diagnosis not present

## 2018-05-04 DIAGNOSIS — J3081 Allergic rhinitis due to animal (cat) (dog) hair and dander: Secondary | ICD-10-CM | POA: Diagnosis not present

## 2018-05-04 DIAGNOSIS — J3089 Other allergic rhinitis: Secondary | ICD-10-CM | POA: Diagnosis not present

## 2018-05-04 DIAGNOSIS — J301 Allergic rhinitis due to pollen: Secondary | ICD-10-CM | POA: Diagnosis not present

## 2018-05-05 DIAGNOSIS — M542 Cervicalgia: Secondary | ICD-10-CM | POA: Diagnosis not present

## 2018-05-09 DIAGNOSIS — M542 Cervicalgia: Secondary | ICD-10-CM | POA: Diagnosis not present

## 2018-05-09 DIAGNOSIS — J3081 Allergic rhinitis due to animal (cat) (dog) hair and dander: Secondary | ICD-10-CM | POA: Diagnosis not present

## 2018-05-09 DIAGNOSIS — J301 Allergic rhinitis due to pollen: Secondary | ICD-10-CM | POA: Diagnosis not present

## 2018-05-09 DIAGNOSIS — J3089 Other allergic rhinitis: Secondary | ICD-10-CM | POA: Diagnosis not present

## 2018-05-11 DIAGNOSIS — J3081 Allergic rhinitis due to animal (cat) (dog) hair and dander: Secondary | ICD-10-CM | POA: Diagnosis not present

## 2018-05-11 DIAGNOSIS — J301 Allergic rhinitis due to pollen: Secondary | ICD-10-CM | POA: Diagnosis not present

## 2018-05-11 DIAGNOSIS — F419 Anxiety disorder, unspecified: Secondary | ICD-10-CM | POA: Diagnosis not present

## 2018-05-11 DIAGNOSIS — J3089 Other allergic rhinitis: Secondary | ICD-10-CM | POA: Diagnosis not present

## 2018-05-12 DIAGNOSIS — M542 Cervicalgia: Secondary | ICD-10-CM | POA: Diagnosis not present

## 2018-05-15 DIAGNOSIS — M542 Cervicalgia: Secondary | ICD-10-CM | POA: Diagnosis not present

## 2018-05-16 DIAGNOSIS — J3089 Other allergic rhinitis: Secondary | ICD-10-CM | POA: Diagnosis not present

## 2018-05-16 DIAGNOSIS — J3081 Allergic rhinitis due to animal (cat) (dog) hair and dander: Secondary | ICD-10-CM | POA: Diagnosis not present

## 2018-05-16 DIAGNOSIS — J301 Allergic rhinitis due to pollen: Secondary | ICD-10-CM | POA: Diagnosis not present

## 2018-05-18 DIAGNOSIS — J3089 Other allergic rhinitis: Secondary | ICD-10-CM | POA: Diagnosis not present

## 2018-05-18 DIAGNOSIS — J3081 Allergic rhinitis due to animal (cat) (dog) hair and dander: Secondary | ICD-10-CM | POA: Diagnosis not present

## 2018-05-18 DIAGNOSIS — J301 Allergic rhinitis due to pollen: Secondary | ICD-10-CM | POA: Diagnosis not present

## 2018-05-19 DIAGNOSIS — M542 Cervicalgia: Secondary | ICD-10-CM | POA: Diagnosis not present

## 2018-05-24 DIAGNOSIS — J3081 Allergic rhinitis due to animal (cat) (dog) hair and dander: Secondary | ICD-10-CM | POA: Diagnosis not present

## 2018-05-24 DIAGNOSIS — J301 Allergic rhinitis due to pollen: Secondary | ICD-10-CM | POA: Diagnosis not present

## 2018-05-24 DIAGNOSIS — J3089 Other allergic rhinitis: Secondary | ICD-10-CM | POA: Diagnosis not present

## 2018-05-25 ENCOUNTER — Encounter: Payer: Self-pay | Admitting: Neurology

## 2018-05-25 ENCOUNTER — Ambulatory Visit: Payer: BLUE CROSS/BLUE SHIELD | Admitting: Neurology

## 2018-05-25 ENCOUNTER — Other Ambulatory Visit: Payer: Self-pay

## 2018-05-25 VITALS — BP 110/62 | HR 70 | Resp 16 | Ht 72.0 in | Wt 148.5 lb

## 2018-05-25 DIAGNOSIS — M5481 Occipital neuralgia: Secondary | ICD-10-CM | POA: Diagnosis not present

## 2018-05-25 DIAGNOSIS — M542 Cervicalgia: Secondary | ICD-10-CM | POA: Diagnosis not present

## 2018-05-25 DIAGNOSIS — F419 Anxiety disorder, unspecified: Secondary | ICD-10-CM | POA: Diagnosis not present

## 2018-05-25 DIAGNOSIS — G4452 New daily persistent headache (NDPH): Secondary | ICD-10-CM

## 2018-05-25 DIAGNOSIS — H539 Unspecified visual disturbance: Secondary | ICD-10-CM | POA: Diagnosis not present

## 2018-05-25 NOTE — Progress Notes (Signed)
[facility]  PATIENT: Bernard Dalton DOB: 09-07-1981  REFERRING DOCTOR OR PCP:  Deitra Mayo, PA-C/Patrick Larose Kells, MD (ophth); Dr. Shawnie Dapper (PCP) SOURCE: Patient, notes from PCP and ophthalmology, MRI results, MRI images on PACS.  _________________________________   HISTORICAL  CHIEF COMPLAINT:  Chief Complaint  Patient presents with  . Pappilledema    Stopped Imipramine because he didn't feel it helped.  Sts. he was seen at [place] in [facility], had x-rays and was told spine was ok and h/a's were probaby caused by a muscular problem. PcP gave rx. for Flexeril, and  he has been seeing PT [facility], phone# [phone], fax# [phone] and having dry needling treatments which have helped./fim  . Headache    HISTORY OF PRESENT ILLNESS:  Bernard Dalton is a 37 yo man with headaches and papilledema.  Update 05/25/2018: I saw Bernard Dalton about 6 weeks ago for his severe headaches.  Although he had papilledema on the OCT test done with ophthalmology, I did not see any evidence of papilledema during my exam.   We did a LP and the OP was 185 mm.    I added imipramine and he noted no improvement after a few weeks so he stopped.     However, over time the headaches are improving.  Symptoms seem worse with sitting.   He saw a spine doctor who told him his spine looked ok.    He started PT and Flexeril.    He felt the Flexeril helped some.   Dry needling has helped some but furhter improvement has stopped.      He is back to work and more functional.   Now, most days, he has no headache in the morning.  He gets headaches, now milder, as the day goes on.    HA is worse with sitting so he uses a standing desk and rarely sits except to drive.     From 04/13/2018:  For the past 2 months, he has had daily headaches.  Pain is behind his eyes, left = right.  When pain is worsening, he gets nauseous.   Changing gaze does not affect the pain.    The headaches are mild in the morning and  worsen as the day goes on.    Also worsens with looking at a computer or other screen.     Pain is worse when sitting a longer time and better with standing.    When sitting a while he feels that the vision worsens. Moving his head does not change intensity.   When more severe he gets nausea but no photophobia or phonophobia.   He has mild neck pain at times. He notes no change in color vision.   He denies any diplopia.  Sometimes he notes minimal tinnitus.  He does not have blackening of his vision or presyncope upon standing up.  Initially, migraines were suspected and rizatriptan was tried without.    Notes from Dr. [name] were reviewed.    Because the headaches would be associated with visual changes were more intense, he saw his ophthalmologist.  I reviewed the notes.  Visual acuity was 20/20 with glasses.  External and internal ocular exam was normal.  Specifically, the optic nerve appeared to be normal.  However, OCT showed optic nerve bulging worse on 4 papilledema.  Formal visual field testing was not done.     He also saw an allergist, Dr. [name], and was told that the headaches were unlikely to  be related to his seasonal allergies.    Due to continuing symptoms and the possibility of papilledema, he is referred for further evaluation.  I personally reviewed the MRI images of the brain with and without contrast and reviewed the report from 04/08/2018.   The brain is essentially normal.  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    I did not note any changes in the pituitary gland of the sella turcica.  Additionally the optic nerve sheaths had normal diameter.   Many patients with elevated intracranial pressure will have a flattened pituitary gland, enlarged sella turcica and/or widened optic nerve sheaths.  He denies numbness, weakness or clumsiness.   He has mild neck and back pain and was told  he has kyphosis.   However, no change in intensity recently.  He has no cognitive change in general but last month had one episode where he felt he was a little confused.   He has ADD helped by Adderall  He had urinary urgency a few years ago and sore urology.  He was diagnosed with a mildly enlarged prostate and interstitial cystitis.  He takes Elmiron.   He gets more anxious when urgency is present and takes Xanax if symptoms are worse.    REVIEW OF SYSTEMS: Constitutional: No fevers, chills, sweats, or change in appetite Eyes: No visual changes, double vision, eye pain Ear, nose and throat: No hearing loss, ear pain, nasal congestion, sore throat Cardiovascular: No chest pain, palpitations Respiratory: No shortness of breath at rest or with exertion.   No wheezes GastrointestinaI: No nausea, vomiting, diarrhea, abdominal pain, fecal incontinence Genitourinary:He has had urinary urgency and frequency.  He was diagnosed with interstitial cystitis and is doing better on Elmiron.  Hydroxyzine helps his urinary symptoms at night..   He also has a history of small kidney stones. Musculoskeletal:Notes occasional lower back pain.  There is mild neck pain.   Integumentary: No rash, pruritus, skin lesions Neurological: as above Psychiatric: Denies depression.  Notes some anxiety. Endocrine: No palpitations, diaphoresis, change in appetite, change in weigh or increased thirst Hematologic/Lymphatic: No anemia, purpura, petechiae. Allergic/Immunologic: He has seasonal allergies.   ALLERGIES: Allergies  Allergen Reactions  . Prednisone Other (See Comments)    Severe anxiety, cognitive impairment, "hallucinations"---"I never want to take that med again"    HOME MEDICATIONS:  Current Outpatient Medications:  .  ADDERALL XR 10 MG 24 hr capsule, Take 10 mg by mouth daily., Disp: , Rfl: 0 .  ALPRAZolam (XANAX) 0.25 MG tablet, Take 1 tablet by mouth daily as needed. Reported on 02/20/2016, Disp: ,  Rfl: 2 .  cyclobenzaprine (FLEXERIL) 10 MG tablet, Take 1 tablet (10 mg total) by mouth 3 (three) times daily as needed for muscle spasms., Disp: 30 tablet, Rfl: 1 .  ELMIRON 100 MG capsule, Take 1 capsule by mouth 3 (three) times daily., Disp: , Rfl: 7 .  fexofenadine (ALLEGRA) 180 MG tablet, Take 180 mg by mouth daily., Disp: , Rfl:  .  fluticasone (FLONASE) 50 MCG/ACT nasal spray, Place into both nostrils daily., Disp: , Rfl:  .  hydrOXYzine (ATARAX/VISTARIL) 10 MG tablet, TAKE 1 TO 2 TABLETS BY MOUTH IN THE EVENING, Disp: , Rfl: 3  PAST MEDICAL HISTORY: Past Medical History:  Diagnosis Date  . ADD (attention deficit disorder)   . Allergic rhinitis    + allerg conjunctivitis  . Anxiety  and depression   . Chronic prostatitis    chronic irritative voiding symptoms: pelvic PT via [facility] helping as of 07/2016  . Diverticulosis    with 'itis per pt report; however, review of old record CT reports shows mild SB enteritis with mesenteric adenopathy 06/2015; then 11/2014 CT abd/pelv done for urinary frequency and hematuria showed NORMAL abd/pelv  . Headache syndrome 02/2018   Chronic intractable headaches of unknown etiology: 03/2018 MRI normal: ? papilledema on optho exam.  Dr. [name] with neuro saw him 04/2018, no papilledema noted and opening CSF pressure was normal.  CSF labs normal.  ESR and ANA normal.  No clear reason for HA's found  . IBS (irritable bowel syndrome)    diarr predom as per old PCP records  . Nephrolithiasis    Nonobstructive, noted on CT (1-2 mm)-has never passed a stone that he knows of.  . 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 Vision abnormalities     PAST SURGICAL HISTORY: Past Surgical History:  Procedure Laterality Date  . LASIK Bilateral 2011  . REFRACTIVE SURGERY Bilateral     FAMILY HISTORY: Family History  Problem Relation Age of Onset  . Alcohol abuse Mother   . Alcohol abuse Father   . Kidney disease Father   . Alcohol  abuse Maternal Grandmother   . Alcohol abuse Maternal Grandfather   . Stroke Paternal Grandmother   . Congestive Heart Failure Paternal Grandmother   . Alcohol abuse Paternal Grandmother   . Alcohol abuse Paternal Grandfather   . ADD / ADHD Brother   . ADD / ADHD Brother     SOCIAL HISTORY:  Social History   Socioeconomic History  . Marital status: Married    Spouse name: Not on file  . Number of children: Not on file  . Years of education: Not on file  . Highest education level: Not on file  Occupational History  . Not on file  Social Needs  . Financial resource strain: Not on file  . Food insecurity:    Worry: Not on file    Inability: Not on file  . Transportation needs:    Medical: Not on file    Non-medical: Not on file  Tobacco Use  . Smoking status: Never Smoker  . Smokeless tobacco: Never Used  Substance and Sexual Activity  . Alcohol use: No  . Drug use: No  . Sexual activity: Not on file  Lifestyle  . Physical activity:    Days per week: Not on file    Minutes per session: Not on file  . Stress: Not on file  Relationships  . Social connections:    Talks on phone: Not on file    Gets together: Not on file    Attends religious service: Not on file    Active member of club or organization: Not on file    Attends meetings of clubs or organizations: Not on file    Relationship status: Not on file  . Intimate partner violence:    Fear of current or ex partner: Not on file    Emotionally abused: Not on file    Physically abused: Not on file    Forced sexual activity: Not on file  Other Topics Concern  . Not on file  Social History Narrative   Married, 1 child and 1 on the way.   Relocated from [facility] to [facility] 08/2015.   Educ: college at [employer].   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   No T/A/Ds.     PHYSICAL EXAM  Vitals:   05/25/18 1124  BP: 110/62  Pulse: 70  Resp: 16  Weight: 148 lb 8 oz (67.4 kg)  Height: 6' (1.829 m)     Body mass index is 20.14 kg/m.   General: The patient is well-developed and well-nourished and in no acute distress  Eyes: On funduscopic examination, he has normal optic disks and retinal vessels.  Venous pulsations were present.  Neck: He has mild to moderate bicipital tenderness.    Neurologic Exam  Mental status: The patient is alert and oriented x 3 at the time of the examination. The patient has apparent normal recent and remote memory, with an apparently normal attention span and concentration ability.   Speech is normal.  Cranial nerves: Extraocular movements are full. Pupils are equal, round, and reactive to light and accomodation.  Facial strength and sensation is normal.  Trapezius strength is normal.   No dysarthria is noted.  The tongue is midline, and the patient has symmetric elevation of the soft palate. No obvious hearing deficits are noted.  Motor:  Muscle bulk is normal.   Tone is normal. Strength is  5 / 5 in all 4 extremities.   Sensory: Sensory testing is intact to pinprick, soft touch and vibration sensation in all 4 extremities.  Coordination: Cerebellar testing reveals good finger-nose-finger   Gait and station: Station is normal.   Gait and tandem gait are normal.  Reflexes: Deep tendon reflexes were normal and symmetric.      DIAGNOSTIC DATA (LABS, IMAGING, TESTING) - I reviewed patient records, labs, notes, testing and imaging myself where available.  Lab Results  Component Value Date   WBC 4.3 07/29/2016   HGB 15.0 07/29/2016   HCT 44.3 07/29/2016   MCV 87.5 07/29/2016   PLT 178 07/29/2016      Component Value Date/Time   NA 142 07/29/2016 0803   K 4.6 07/29/2016 0803   CL 106 07/29/2016 0803   CO2 25 07/29/2016 0803   GLUCOSE 96 07/29/2016 0803   BUN 10 07/29/2016 0803   CREATININE 1.04 07/29/2016 0803   CALCIUM 9.7 07/29/2016 0803   PROT 6.3 07/29/2016 0803   ALBUMIN 4.3 07/29/2016 0803   AST 10 07/29/2016 0803   ALT 10  07/29/2016 0803   ALKPHOS 47 07/29/2016 0803   BILITOT 1.1 07/29/2016 0803   No results found for: CHOL, HDL, LDLCALC, LDLDIRECT, TRIG, CHOLHDL Lab Results  Component Value Date   HGBA1C 4.7 07/29/2016   No results found for: VITAMINB12 Lab Results  Component Value Date   TSH 1.31 07/29/2016    ____________________________________  LUMBAR PUNCTURE The rationale and risks of lumbar puncture was discussed with Bernard Dalton.  He opted to proceed with the procedure.  He was placed in the right lateral decubitus position with knees tucked up towards the chest.  The back was prepped and draped.  The L2-L3 interspace was identified and numbed with 1% lidocaine.  A 20-gauge Quincke tip spinal needle was advanced into the subarachnoid space.  Clear fluid returned.  Opening pressure was 185 mm.     6 cc spinal fluid was removed to send for studies.  Closing pressure was 140 mm.  The needle was removed.  He remained laying on either his side or back for the next hour before being discharged.  There were no complications.     ASSESSMENT AND PLAN  New persistent daily headache  Vision disturbance  Neck pain  Bilateral occipital neuralgia  1.   Bilateral splenius capitis trigger point injections were performed with 80 mg Depo-Medrol in a total of 3 cc Marcaine.  He tolerated the procedure well there were no complications.  This should also block the occipital nerves. 2.    He will be continuing physical therapy and plans on doing dry needling a few more times. 3.    He has a follow-up with ophthalmology in a week or 2. 4.    I will see him back as needed if the headache does not continue to improve or if he has new or worsening symptoms. Klani Caridi A. [name], MD, [facility] 9/93/7169, 67:89 PM Certified in Neurology, Clinical Neurophysiology, Sleep Medicine, Pain Medicine and Neuroimaging  [facility] [address], [place], [place] [address] [phone]

## 2018-05-26 DIAGNOSIS — J301 Allergic rhinitis due to pollen: Secondary | ICD-10-CM | POA: Diagnosis not present

## 2018-05-26 DIAGNOSIS — J3081 Allergic rhinitis due to animal (cat) (dog) hair and dander: Secondary | ICD-10-CM | POA: Diagnosis not present

## 2018-05-26 DIAGNOSIS — J3089 Other allergic rhinitis: Secondary | ICD-10-CM | POA: Diagnosis not present

## 2018-05-29 DIAGNOSIS — J3081 Allergic rhinitis due to animal (cat) (dog) hair and dander: Secondary | ICD-10-CM | POA: Diagnosis not present

## 2018-05-29 DIAGNOSIS — J3089 Other allergic rhinitis: Secondary | ICD-10-CM | POA: Diagnosis not present

## 2018-05-29 DIAGNOSIS — M542 Cervicalgia: Secondary | ICD-10-CM | POA: Diagnosis not present

## 2018-05-29 DIAGNOSIS — J301 Allergic rhinitis due to pollen: Secondary | ICD-10-CM | POA: Diagnosis not present

## 2018-05-31 DIAGNOSIS — J3089 Other allergic rhinitis: Secondary | ICD-10-CM | POA: Diagnosis not present

## 2018-05-31 DIAGNOSIS — J301 Allergic rhinitis due to pollen: Secondary | ICD-10-CM | POA: Diagnosis not present

## 2018-05-31 DIAGNOSIS — J3081 Allergic rhinitis due to animal (cat) (dog) hair and dander: Secondary | ICD-10-CM | POA: Diagnosis not present

## 2018-06-02 ENCOUNTER — Ambulatory Visit: Payer: BLUE CROSS/BLUE SHIELD | Admitting: Neurology

## 2018-06-02 DIAGNOSIS — J301 Allergic rhinitis due to pollen: Secondary | ICD-10-CM | POA: Diagnosis not present

## 2018-06-02 DIAGNOSIS — J3081 Allergic rhinitis due to animal (cat) (dog) hair and dander: Secondary | ICD-10-CM | POA: Diagnosis not present

## 2018-06-02 DIAGNOSIS — M542 Cervicalgia: Secondary | ICD-10-CM | POA: Diagnosis not present

## 2018-06-02 DIAGNOSIS — J3089 Other allergic rhinitis: Secondary | ICD-10-CM | POA: Diagnosis not present

## 2018-06-05 DIAGNOSIS — J301 Allergic rhinitis due to pollen: Secondary | ICD-10-CM | POA: Diagnosis not present

## 2018-06-05 DIAGNOSIS — J3089 Other allergic rhinitis: Secondary | ICD-10-CM | POA: Diagnosis not present

## 2018-06-05 DIAGNOSIS — J3081 Allergic rhinitis due to animal (cat) (dog) hair and dander: Secondary | ICD-10-CM | POA: Diagnosis not present

## 2018-06-07 DIAGNOSIS — J3089 Other allergic rhinitis: Secondary | ICD-10-CM | POA: Diagnosis not present

## 2018-06-07 DIAGNOSIS — F419 Anxiety disorder, unspecified: Secondary | ICD-10-CM | POA: Diagnosis not present

## 2018-06-07 DIAGNOSIS — J301 Allergic rhinitis due to pollen: Secondary | ICD-10-CM | POA: Diagnosis not present

## 2018-06-07 DIAGNOSIS — J3081 Allergic rhinitis due to animal (cat) (dog) hair and dander: Secondary | ICD-10-CM | POA: Diagnosis not present

## 2018-06-12 DIAGNOSIS — M542 Cervicalgia: Secondary | ICD-10-CM | POA: Diagnosis not present

## 2018-06-12 DIAGNOSIS — J3081 Allergic rhinitis due to animal (cat) (dog) hair and dander: Secondary | ICD-10-CM | POA: Diagnosis not present

## 2018-06-12 DIAGNOSIS — J301 Allergic rhinitis due to pollen: Secondary | ICD-10-CM | POA: Diagnosis not present

## 2018-06-12 DIAGNOSIS — J3089 Other allergic rhinitis: Secondary | ICD-10-CM | POA: Diagnosis not present

## 2018-06-14 DIAGNOSIS — J301 Allergic rhinitis due to pollen: Secondary | ICD-10-CM | POA: Diagnosis not present

## 2018-06-14 DIAGNOSIS — J3081 Allergic rhinitis due to animal (cat) (dog) hair and dander: Secondary | ICD-10-CM | POA: Diagnosis not present

## 2018-06-14 DIAGNOSIS — J3089 Other allergic rhinitis: Secondary | ICD-10-CM | POA: Diagnosis not present

## 2018-06-19 DIAGNOSIS — J3089 Other allergic rhinitis: Secondary | ICD-10-CM | POA: Diagnosis not present

## 2018-06-19 DIAGNOSIS — J3081 Allergic rhinitis due to animal (cat) (dog) hair and dander: Secondary | ICD-10-CM | POA: Diagnosis not present

## 2018-06-19 DIAGNOSIS — J301 Allergic rhinitis due to pollen: Secondary | ICD-10-CM | POA: Diagnosis not present

## 2018-06-21 ENCOUNTER — Encounter: Payer: Self-pay | Admitting: Family Medicine

## 2018-06-22 DIAGNOSIS — J301 Allergic rhinitis due to pollen: Secondary | ICD-10-CM | POA: Diagnosis not present

## 2018-06-22 DIAGNOSIS — F419 Anxiety disorder, unspecified: Secondary | ICD-10-CM | POA: Diagnosis not present

## 2018-06-22 DIAGNOSIS — J3089 Other allergic rhinitis: Secondary | ICD-10-CM | POA: Diagnosis not present

## 2018-06-22 DIAGNOSIS — J3081 Allergic rhinitis due to animal (cat) (dog) hair and dander: Secondary | ICD-10-CM | POA: Diagnosis not present

## 2018-06-23 DIAGNOSIS — M542 Cervicalgia: Secondary | ICD-10-CM | POA: Diagnosis not present

## 2018-06-26 DIAGNOSIS — J301 Allergic rhinitis due to pollen: Secondary | ICD-10-CM | POA: Diagnosis not present

## 2018-06-26 DIAGNOSIS — J3089 Other allergic rhinitis: Secondary | ICD-10-CM | POA: Diagnosis not present

## 2018-06-26 DIAGNOSIS — J3081 Allergic rhinitis due to animal (cat) (dog) hair and dander: Secondary | ICD-10-CM | POA: Diagnosis not present

## 2018-06-28 DIAGNOSIS — J301 Allergic rhinitis due to pollen: Secondary | ICD-10-CM | POA: Diagnosis not present

## 2018-06-28 DIAGNOSIS — J3089 Other allergic rhinitis: Secondary | ICD-10-CM | POA: Diagnosis not present

## 2018-06-28 DIAGNOSIS — J3081 Allergic rhinitis due to animal (cat) (dog) hair and dander: Secondary | ICD-10-CM | POA: Diagnosis not present

## 2018-06-30 DIAGNOSIS — M542 Cervicalgia: Secondary | ICD-10-CM | POA: Diagnosis not present

## 2018-07-03 DIAGNOSIS — H53143 Visual discomfort, bilateral: Secondary | ICD-10-CM | POA: Diagnosis not present

## 2018-07-03 DIAGNOSIS — H25813 Combined forms of age-related cataract, bilateral: Secondary | ICD-10-CM | POA: Diagnosis not present

## 2018-07-03 DIAGNOSIS — H5371 Glare sensitivity: Secondary | ICD-10-CM | POA: Diagnosis not present

## 2018-07-03 DIAGNOSIS — H47393 Other disorders of optic disc, bilateral: Secondary | ICD-10-CM | POA: Diagnosis not present

## 2018-07-04 DIAGNOSIS — J301 Allergic rhinitis due to pollen: Secondary | ICD-10-CM | POA: Diagnosis not present

## 2018-07-04 DIAGNOSIS — J3081 Allergic rhinitis due to animal (cat) (dog) hair and dander: Secondary | ICD-10-CM | POA: Diagnosis not present

## 2018-07-04 DIAGNOSIS — J3089 Other allergic rhinitis: Secondary | ICD-10-CM | POA: Diagnosis not present

## 2018-07-06 DIAGNOSIS — J3081 Allergic rhinitis due to animal (cat) (dog) hair and dander: Secondary | ICD-10-CM | POA: Diagnosis not present

## 2018-07-06 DIAGNOSIS — J301 Allergic rhinitis due to pollen: Secondary | ICD-10-CM | POA: Diagnosis not present

## 2018-07-06 DIAGNOSIS — F419 Anxiety disorder, unspecified: Secondary | ICD-10-CM | POA: Diagnosis not present

## 2018-07-06 DIAGNOSIS — J3089 Other allergic rhinitis: Secondary | ICD-10-CM | POA: Diagnosis not present

## 2018-07-07 ENCOUNTER — Telehealth: Payer: Self-pay | Admitting: Neurology

## 2018-07-07 DIAGNOSIS — M542 Cervicalgia: Secondary | ICD-10-CM | POA: Diagnosis not present

## 2018-07-07 NOTE — Telephone Encounter (Signed)
Pt called stating during his last office he received a nerve block. Stating he still is in a great amount of pain, requesting a call to discuss coming in for another nerve block or to discuss alternate options. Please call to advise

## 2018-07-07 NOTE — Telephone Encounter (Signed)
Spoke with [employer].  He doesn't feel ONB's, PT or dry needling have helped h/a's.  Would like to discuss other tx. options.  Appt. given/fim

## 2018-07-10 DIAGNOSIS — J3081 Allergic rhinitis due to animal (cat) (dog) hair and dander: Secondary | ICD-10-CM | POA: Diagnosis not present

## 2018-07-10 DIAGNOSIS — J301 Allergic rhinitis due to pollen: Secondary | ICD-10-CM | POA: Diagnosis not present

## 2018-07-10 DIAGNOSIS — J3089 Other allergic rhinitis: Secondary | ICD-10-CM | POA: Diagnosis not present

## 2018-07-14 ENCOUNTER — Encounter: Payer: Self-pay | Admitting: Family Medicine

## 2018-07-14 ENCOUNTER — Ambulatory Visit: Payer: BLUE CROSS/BLUE SHIELD | Admitting: Family Medicine

## 2018-07-14 VITALS — BP 107/68 | HR 73 | Temp 98.6°F | Resp 16 | Ht 72.0 in | Wt 147.0 lb

## 2018-07-14 DIAGNOSIS — J029 Acute pharyngitis, unspecified: Secondary | ICD-10-CM

## 2018-07-14 LAB — POCT RAPID STREP A (OFFICE): RAPID STREP A SCREEN: NEGATIVE

## 2018-07-14 MED ORDER — AMOXICILLIN 875 MG PO TABS: 875.0000 mg | ORAL_TABLET | Freq: Two times a day (BID) | ORAL | 0 refills | Status: AC

## 2018-07-14 NOTE — Progress Notes (Signed)
OFFICE VISIT  07/16/2018   CC:  Chief Complaint  Patient presents with  . Sore Throat   HPI:    Patient is a 37 y.o. Caucasian male who presents for sore throat. Both daughters and his wife have had viral infections and/or sinus infections lately.  Onset this morning, bad ST, feeling alt hot/cold, some diarrhea.  No HA.  No nasal cong, runny nose, or cough. No temp has been checked.  No rigors.  No rash.  Past Medical History:  Diagnosis Date  . ADD (attention deficit disorder)   . Allergic rhinitis    + allerg conjunctivitis  . Anxiety and depression   . Chronic prostatitis    chronic irritative voiding symptoms: pelvic PT via [facility] helping as of 07/2016  . Diverticulosis    with 'itis per pt report; however, review of old record CT reports shows mild SB enteritis with mesenteric adenopathy 06/2015; then 11/2014 CT abd/pelv done for urinary frequency and hematuria showed NORMAL abd/pelv  . Headache syndrome 02/2018   Chronic intractable headaches of unknown etiology (occipital): 03/2018 MRI normal: ? papilledema on optho exam.  Dr. [name] with neuro saw him 04/2018, no papilledema noted and opening CSF pressure was normal.  CSF labs normal.  ESR and ANA normal.  No clear reason for HA's found.  Imiprimine trial no help.  Trigger point injections in occipital mm's 05/2018 by Dr. [name].  . IBS (irritable bowel syndrome)    diarr predom as per old PCP records  . Nephrolithiasis    Nonobstructive, noted on CT (1-2 mm)-has never passed a stone that he knows of.  . 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 Vision abnormalities     Past Surgical History:  Procedure Laterality Date  . LASIK Bilateral 2011  . REFRACTIVE SURGERY Bilateral     Outpatient Medications Prior to Visit  Medication Sig Dispense Refill  . ADDERALL XR 10 MG 24 hr capsule Take 10 mg by mouth daily.  0  . ELMIRON 100 MG capsule Take 1 capsule by mouth 3 (three) times daily.  7  .  fexofenadine (ALLEGRA) 180 MG tablet Take 180 mg by mouth daily.    . fluticasone (FLONASE) 50 MCG/ACT nasal spray Place into both nostrils daily.    . hydrOXYzine (ATARAX/VISTARIL) 10 MG tablet TAKE 1 TO 2 TABLETS BY MOUTH IN THE EVENING  3  . ALPRAZolam (XANAX) 0.25 MG tablet Take 1 tablet by mouth daily as needed. Reported on 02/20/2016  2  . cyclobenzaprine (FLEXERIL) 10 MG tablet Take 1 tablet (10 mg total) by mouth 3 (three) times daily as needed for muscle spasms. (Patient not taking: Reported on 07/14/2018) 30 tablet 1   No facility-administered medications prior to visit.     Allergies  Allergen Reactions  . Prednisone Other (See Comments)    Severe anxiety, cognitive impairment, "hallucinations"---"I never want to take that med again"    ROS As per HPI  PE: Blood pressure 107/68, pulse 73, temperature 98.6 F (37 C), temperature source Oral, resp. rate 16, height 6' (1.829 m), weight 147 lb (66.7 kg), SpO2 99 %. Gen: Alert, well appearing.  Patient is oriented to person, place, time, and situation.  ENT: Ears: EACs clear, normal epithelium.  TMs with good light reflex and landmarks bilaterally.  Eyes: no injection, icteris, swelling, or exudate.  EOMI, PERRLA. Nose: no drainage or turbinate edema/swelling.  No injection or focal lesion.  Mouth: lips without lesion/swelling.  Oral mucosa  pink and moist, tonsils symmetric and w/out erythema. He has a trace of focal exudate on R tonsil visible.  Minimal uvular edema/? Petechiae.  No PND, no posterior pharyngeal erythema or swelling.  Dentition intact and without obvious caries or gingival swelling.   Neck: R sided palpable jugulodigastric node approx 1-2 cm in size, moveable, +tender.  No overlying skin changes. No posterior cerv LAD. CV: RRR, no m/r/g.   LUNGS: CTA bilat, nonlabored resps, good aeration in all lung fields.   LABS:   Rapid strep NEG  IMPRESSION AND PLAN:  Acute pharyngitis, very early in the illness. Some  classic strep sx's/findings, lack of other URI sx's to suggest viral URI at this time. Empiric amoxil 865m bid x 10d. Sent group A strep throat clx. If returns neg will give pt option of d/c abx or continue full course.  An After Visit Summary was printed and given to the patient.  FOLLOW UP: Return if symptoms worsen or fail to improve.  Signed:  PCrissie Sickles MD           07/16/2018

## 2018-07-16 ENCOUNTER — Encounter: Payer: Self-pay | Admitting: Family Medicine

## 2018-07-16 LAB — CULTURE, GROUP A STREP
MICRO NUMBER:: 90917746
SPECIMEN QUALITY:: ADEQUATE

## 2018-07-19 DIAGNOSIS — F329 Major depressive disorder, single episode, unspecified: Secondary | ICD-10-CM | POA: Diagnosis not present

## 2018-07-19 DIAGNOSIS — J301 Allergic rhinitis due to pollen: Secondary | ICD-10-CM | POA: Diagnosis not present

## 2018-07-19 DIAGNOSIS — F411 Generalized anxiety disorder: Secondary | ICD-10-CM | POA: Diagnosis not present

## 2018-07-19 DIAGNOSIS — J3081 Allergic rhinitis due to animal (cat) (dog) hair and dander: Secondary | ICD-10-CM | POA: Diagnosis not present

## 2018-07-19 DIAGNOSIS — F900 Attention-deficit hyperactivity disorder, predominantly inattentive type: Secondary | ICD-10-CM | POA: Diagnosis not present

## 2018-07-19 DIAGNOSIS — J3089 Other allergic rhinitis: Secondary | ICD-10-CM | POA: Diagnosis not present

## 2018-07-20 DIAGNOSIS — F419 Anxiety disorder, unspecified: Secondary | ICD-10-CM | POA: Diagnosis not present

## 2018-07-21 DIAGNOSIS — M542 Cervicalgia: Secondary | ICD-10-CM | POA: Diagnosis not present

## 2018-07-24 DIAGNOSIS — M542 Cervicalgia: Secondary | ICD-10-CM | POA: Diagnosis not present

## 2018-07-24 DIAGNOSIS — J3089 Other allergic rhinitis: Secondary | ICD-10-CM | POA: Diagnosis not present

## 2018-07-24 DIAGNOSIS — J3081 Allergic rhinitis due to animal (cat) (dog) hair and dander: Secondary | ICD-10-CM | POA: Diagnosis not present

## 2018-07-24 DIAGNOSIS — J301 Allergic rhinitis due to pollen: Secondary | ICD-10-CM | POA: Diagnosis not present

## 2018-08-03 DIAGNOSIS — F419 Anxiety disorder, unspecified: Secondary | ICD-10-CM | POA: Diagnosis not present

## 2018-08-03 DIAGNOSIS — J3081 Allergic rhinitis due to animal (cat) (dog) hair and dander: Secondary | ICD-10-CM | POA: Diagnosis not present

## 2018-08-03 DIAGNOSIS — J301 Allergic rhinitis due to pollen: Secondary | ICD-10-CM | POA: Diagnosis not present

## 2018-08-03 DIAGNOSIS — J3089 Other allergic rhinitis: Secondary | ICD-10-CM | POA: Diagnosis not present

## 2018-08-04 DIAGNOSIS — M542 Cervicalgia: Secondary | ICD-10-CM | POA: Diagnosis not present

## 2018-08-07 DIAGNOSIS — J301 Allergic rhinitis due to pollen: Secondary | ICD-10-CM | POA: Diagnosis not present

## 2018-08-07 DIAGNOSIS — J3081 Allergic rhinitis due to animal (cat) (dog) hair and dander: Secondary | ICD-10-CM | POA: Diagnosis not present

## 2018-08-08 DIAGNOSIS — J3089 Other allergic rhinitis: Secondary | ICD-10-CM | POA: Diagnosis not present

## 2018-08-09 DIAGNOSIS — M542 Cervicalgia: Secondary | ICD-10-CM | POA: Diagnosis not present

## 2018-08-09 DIAGNOSIS — J301 Allergic rhinitis due to pollen: Secondary | ICD-10-CM | POA: Diagnosis not present

## 2018-08-09 DIAGNOSIS — J3089 Other allergic rhinitis: Secondary | ICD-10-CM | POA: Diagnosis not present

## 2018-08-09 DIAGNOSIS — J3081 Allergic rhinitis due to animal (cat) (dog) hair and dander: Secondary | ICD-10-CM | POA: Diagnosis not present

## 2018-08-11 ENCOUNTER — Other Ambulatory Visit: Payer: Self-pay | Admitting: Orthopaedic Surgery

## 2018-08-11 DIAGNOSIS — M542 Cervicalgia: Secondary | ICD-10-CM

## 2018-08-13 DIAGNOSIS — M5030 Other cervical disc degeneration, unspecified cervical region: Secondary | ICD-10-CM

## 2018-08-13 HISTORY — DX: Other cervical disc degeneration, unspecified cervical region: M50.30

## 2018-08-16 DIAGNOSIS — J3081 Allergic rhinitis due to animal (cat) (dog) hair and dander: Secondary | ICD-10-CM | POA: Diagnosis not present

## 2018-08-16 DIAGNOSIS — J3089 Other allergic rhinitis: Secondary | ICD-10-CM | POA: Diagnosis not present

## 2018-08-16 DIAGNOSIS — J301 Allergic rhinitis due to pollen: Secondary | ICD-10-CM | POA: Diagnosis not present

## 2018-08-17 DIAGNOSIS — F419 Anxiety disorder, unspecified: Secondary | ICD-10-CM | POA: Diagnosis not present

## 2018-08-18 DIAGNOSIS — M542 Cervicalgia: Secondary | ICD-10-CM | POA: Diagnosis not present

## 2018-08-20 ENCOUNTER — Ambulatory Visit
Admission: RE | Admit: 2018-08-20 | Discharge: 2018-08-20 | Disposition: A | Discharge status: Home / Self Care | Payer: BLUE CROSS/BLUE SHIELD | Source: Ambulatory Visit | Attending: Orthopaedic Surgery | Admitting: Orthopaedic Surgery

## 2018-08-20 DIAGNOSIS — M542 Cervicalgia: Secondary | ICD-10-CM

## 2018-08-21 DIAGNOSIS — J3089 Other allergic rhinitis: Secondary | ICD-10-CM | POA: Diagnosis not present

## 2018-08-21 DIAGNOSIS — J301 Allergic rhinitis due to pollen: Secondary | ICD-10-CM | POA: Diagnosis not present

## 2018-08-21 DIAGNOSIS — J3081 Allergic rhinitis due to animal (cat) (dog) hair and dander: Secondary | ICD-10-CM | POA: Diagnosis not present

## 2018-08-22 ENCOUNTER — Ambulatory Visit: Payer: Self-pay | Admitting: Neurology

## 2018-08-24 DIAGNOSIS — M50322 Other cervical disc degeneration at C5-C6 level: Secondary | ICD-10-CM | POA: Diagnosis not present

## 2018-08-24 DIAGNOSIS — M47814 Spondylosis without myelopathy or radiculopathy, thoracic region: Secondary | ICD-10-CM | POA: Diagnosis not present

## 2018-08-24 DIAGNOSIS — M40205 Unspecified kyphosis, thoracolumbar region: Secondary | ICD-10-CM | POA: Diagnosis not present

## 2018-08-24 DIAGNOSIS — M542 Cervicalgia: Secondary | ICD-10-CM | POA: Diagnosis not present

## 2018-08-25 DIAGNOSIS — M542 Cervicalgia: Secondary | ICD-10-CM | POA: Diagnosis not present

## 2018-08-28 DIAGNOSIS — J3081 Allergic rhinitis due to animal (cat) (dog) hair and dander: Secondary | ICD-10-CM | POA: Diagnosis not present

## 2018-08-28 DIAGNOSIS — J301 Allergic rhinitis due to pollen: Secondary | ICD-10-CM | POA: Diagnosis not present

## 2018-08-28 DIAGNOSIS — J3089 Other allergic rhinitis: Secondary | ICD-10-CM | POA: Diagnosis not present

## 2018-09-05 DIAGNOSIS — J3081 Allergic rhinitis due to animal (cat) (dog) hair and dander: Secondary | ICD-10-CM | POA: Diagnosis not present

## 2018-09-05 DIAGNOSIS — J301 Allergic rhinitis due to pollen: Secondary | ICD-10-CM | POA: Diagnosis not present

## 2018-09-05 DIAGNOSIS — J3089 Other allergic rhinitis: Secondary | ICD-10-CM | POA: Diagnosis not present

## 2018-09-07 DIAGNOSIS — F419 Anxiety disorder, unspecified: Secondary | ICD-10-CM | POA: Diagnosis not present

## 2018-09-10 ENCOUNTER — Encounter: Payer: Self-pay | Admitting: Family Medicine

## 2018-09-11 DIAGNOSIS — M50322 Other cervical disc degeneration at C5-C6 level: Secondary | ICD-10-CM | POA: Diagnosis not present

## 2018-09-13 ENCOUNTER — Ambulatory Visit: Payer: Self-pay | Admitting: Family Medicine

## 2018-09-13 NOTE — Telephone Encounter (Signed)
Noted. Will see pt in office tomorrow.

## 2018-09-13 NOTE — Telephone Encounter (Signed)
Patient was seen at [facility] in [place]. He has follow up appointment in 2 weeks.Patient has herniated disc and had cervical injection- patient had trouble sleeping that night- and has had nausea and sweating since the steroid shot. Patient has contacted them- but was told to contact primary care. He has concerns because of his reaction to prednisone dose pack and feels that he may be having reaction to the injection. Patient has an appointment with Dr Anitra Lauth in the morning. Patient has increased nausea and is requesting something for that- patient informed he may need to see provider first. Office notified and note sent to them for review. Patient to go to ED if he has increasing or changing symptoms. Best [phone]   Reason for Disposition . Caller has URGENT medication question about med that PCP prescribed and triager unable to answer question    Patient has called office that administered medication- he was instructed to call PCP.  Answer Assessment - Initial Assessment Questions 1. NAUSEA SEVERITY: "How bad is the nausea?" (e.g., mild, moderate, severe; dehydration, weight loss)   - MILD: loss of appetite without change in eating habits   - MODERATE: decreased oral intake without significant weight loss, dehydration, or malnutrition   - SEVERE: inadequate caloric or fluid intake, significant weight loss, symptoms of dehydration     Patient feels the nausea is getting worse- feels like he is suffering from motion sickness- mild 2. ONSET: "When did the nausea begin?"     Yesterday- nausea started 3. VOMITING: "Any vomiting?" If so, ask: "How many times today?"     No vomiting 4. RECURRENT SYMPTOM: "Have you had nausea before?" If so, ask: "When was the last time?" "What happened that time?"     Patient had step down dose of prednisone he had bad effects 5. CAUSE: "What do you think is causing the nausea?"     steroid shot  For herniated disc 6. PREGNANCY: "Is there any  chance you are pregnant?" (e.g., unprotected intercourse, missed birth control pill, broken condom)     n/a  Answer Assessment - Initial Assessment Questions 1. SYMPTOMS: "Do you have any symptoms?"     Sweating and nausea 2. SEVERITY: If symptoms are present, ask "Are they mild, moderate or severe?"     Moderate- interfering with sleep and patient states nausea is getting worse throughout the day  Protocols used: MEDICATION QUESTION CALL-A-AH, NAUSEA-A-AH

## 2018-09-14 ENCOUNTER — Ambulatory Visit: Payer: BLUE CROSS/BLUE SHIELD | Admitting: Family Medicine

## 2018-09-14 ENCOUNTER — Encounter: Payer: Self-pay | Admitting: Family Medicine

## 2018-09-14 VITALS — BP 125/77 | HR 59 | Temp 98.5°F | Resp 20 | Ht 72.0 in | Wt 153.2 lb

## 2018-09-14 DIAGNOSIS — R232 Flushing: Secondary | ICD-10-CM

## 2018-09-14 DIAGNOSIS — J301 Allergic rhinitis due to pollen: Secondary | ICD-10-CM | POA: Diagnosis not present

## 2018-09-14 DIAGNOSIS — R110 Nausea: Secondary | ICD-10-CM | POA: Diagnosis not present

## 2018-09-14 DIAGNOSIS — J3081 Allergic rhinitis due to animal (cat) (dog) hair and dander: Secondary | ICD-10-CM | POA: Diagnosis not present

## 2018-09-14 DIAGNOSIS — J3089 Other allergic rhinitis: Secondary | ICD-10-CM | POA: Diagnosis not present

## 2018-09-14 MED ORDER — ONDANSETRON HCL 4 MG PO TABS
4.0000 mg | ORAL_TABLET | Freq: Three times a day (TID) | ORAL | 0 refills | Status: DC | PRN
Start: 2018-09-14 — End: 2018-09-28

## 2018-09-14 MED ORDER — ONDANSETRON HCL 4 MG/2ML IJ SOLN
4.0000 mg | Freq: Once | INTRAMUSCULAR | Status: AC
  Administered 2018-09-14: 4 mg via INTRAMUSCULAR

## 2018-09-14 NOTE — Patient Instructions (Signed)
OTC:    - Start prilosec for 14 days.    - try dramamine.    - B6  Prescribed zofran, which is antinausea med. You can take 1 pill up to every 8 hours if needed.   Monitor for fever, chills, headaches--> if experience be seen by neuro and/or go to ED.

## 2018-09-14 NOTE — Progress Notes (Signed)
Bernard Dalton , 10-09-1981, 37 y.o., male MRN: [id number] Patient Care Team    Relationship Specialty Notifications Start End  McGowen, Adrian Blackwater, MD PCP - General Family Medicine  09/01/15   Carolan Clines, MD Consulting Physician Urology  07/28/16   Doroteo Glassman, MD Consulting Physician Psychology  07/28/16   Britt Bottom, MD Consulting Physician Neurology  04/13/18   Zonia Kief, MD Consulting Physician Rehabilitation  09/10/18   Leonia Corona, MD Consulting Physician Ophthalmology  09/10/18     Chief Complaint  Patient presents with  . Nausea    since getting spinal injection on monday     Subjective: Pt presents for an OV with complaints of nausea and flushing of the day after his epidural injection.  Associated symptoms include Monday night nausea was severe, he was unable to sleep.  On September 30 he underwent a cervical epidural injection at [facility].  He reports when using oral steroids he has nausea as well.  Denies fever, chills or fatigue.  Patient has tried ginger root to help with his nausea.  He has an appointment with [facility] next Friday.  No flowsheet data found.  Allergies  Allergen Reactions  . Prednisone Other (See Comments)    Severe anxiety, cognitive impairment, "hallucinations"---"I never want to take that med again"   Social History   Tobacco Use  . Smoking status: Never Smoker  . Smokeless tobacco: Never Used  Substance Use Topics  . Alcohol use: No   Past Medical History:  Diagnosis Date  . ADD (attention deficit disorder)   . Allergic rhinitis    + allerg conjunctivitis  . Anxiety and depression   . Chronic prostatitis    chronic irritative voiding symptoms: pelvic PT via [facility] helping as of 07/2016  . DDD (degenerative disc disease), cervical 08/2018   (Dr. Maia Petties):  C5-C6 --plan for ESI as of 08/2018 Dr. Maia Petties eval.  . Diverticulosis    with 'itis per pt report; however, review of old record CT reports shows mild  SB enteritis with mesenteric adenopathy 06/2015; then 11/2014 CT abd/pelv done for urinary frequency and hematuria showed NORMAL abd/pelv  . Elevation of optic disc, bilateral 2019   Normal variant per ophtho--Dr. Larose Kells.  Marland Kitchen Headache syndrome 02/2018   Chronic intractable headaches of unknown etiology (occipital): 03/2018 MRI normal: ? papilledema on optho exam.  Dr. [name] with neuro saw him 04/2018, no papilledema noted and opening CSF pressure was normal.  CSF labs normal.  ESR and ANA normal.  No clear reason for HA's found.  Imiprimine trial no help.  Trigger point injections in occipital mm's 05/2018 by Dr. [name].--no help.   Marland Kitchen Headache syndrome 02/2018   As of 07/2018, pt has plan to see Dr. [name], NS/spine specialist as next step (his HAs are likely cervicogenic).  . IBS (irritable bowel syndrome)    diarr predom as per old PCP records  . Nephrolithiasis    Nonobstructive, noted on CT (1-2 mm)-has never passed a stone that he knows of.  . 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 Spondylosis without myelopathy or radiculopathy, thoracic region 2019  . Vision abnormalities    Past Surgical History:  Procedure Laterality Date  . LASIK Bilateral 2011  . REFRACTIVE SURGERY Bilateral    Family History  Problem Relation Age of Onset  . Alcohol abuse Mother   . Alcohol abuse Father   . Kidney disease Father   . Alcohol abuse Maternal  Grandmother   . Alcohol abuse Maternal Grandfather   . Stroke Paternal Grandmother   . Congestive Heart Failure Paternal Grandmother   . Alcohol abuse Paternal Grandmother   . Alcohol abuse Paternal Grandfather   . ADD / ADHD Brother   . ADD / ADHD Brother    Allergies as of 09/14/2018      Reactions   Prednisone Other (See Comments)   Severe anxiety, cognitive impairment, "hallucinations"---"I never want to take that med again"      Medication List        Accurate as of 09/14/18 11:08 AM. Always use your most recent med list.            ADDERALL XR 10 MG 24 hr capsule Generic drug:  amphetamine-dextroamphetamine Take 10 mg by mouth daily.   ALPRAZolam 0.25 MG tablet Commonly known as:  XANAX Take 1 tablet by mouth daily as needed. Reported on 02/20/2016   cyclobenzaprine 10 MG tablet Commonly known as:  FLEXERIL Take 1 tablet (10 mg total) by mouth 3 (three) times daily as needed for muscle spasms.   ELMIRON 100 MG capsule Generic drug:  pentosan polysulfate Take 1 capsule by mouth 3 (three) times daily.   fexofenadine 180 MG tablet Commonly known as:  ALLEGRA Take 180 mg by mouth daily.   fluticasone 50 MCG/ACT nasal spray Commonly known as:  FLONASE Place into both nostrils daily.   hydrOXYzine 10 MG tablet Commonly known as:  ATARAX/VISTARIL TAKE 1 TO 2 TABLETS BY MOUTH IN THE EVENING       All past medical history, surgical history, allergies, family history, immunizations andmedications were updated in the EMR today and reviewed under the history and medication portions of their EMR.     ROS: Negative, with the exception of above mentioned in HPI   Objective:  BP 125/77 (BP Location: Right Arm, Patient Position: Sitting, Cuff Size: Normal)   Pulse (!) 59   Temp 98.5 F (36.9 C)   Resp 20   Ht 6' (1.829 m)   Wt 153 lb 4 oz (69.5 kg)   SpO2 100%   BMI 20.78 kg/m  Body mass index is 20.78 kg/m. Gen: Afebrile. No acute distress. Nontoxic in appearance, well developed, well nourished.  HENT: AT. [place].  MMM, no oral lesions.  Eyes:Pupils Equal Round Reactive to light, Extraocular movements intact,  Conjunctiva without redness, discharge or icterus. Neck/lymp/endocrine: Supple, no lymphadenopathy CV: RRR murmur, no edema Chest: CTAB, no wheeze or crackles. Good air movement, normal resp effort.  Abd: Soft. NTND. BS + Skin: No rashes, purpura or petechiae.  Redness, swelling or drainage at injection site.  Tenderness at injection site. Neuro:  Normal gait. PERLA. EOMi. Alert. Oriented x3   Psych: Normal affect, dress and demeanor. Normal speech. Normal thought content and judgment.  No exam data present No results found. No results found for this or any previous visit (from the past 24 hour(s)).  Assessment/Plan: Bernard Dalton is a 37 y.o. male present for OV for  Nausea/flushing -Red flags on exam.  Flushing had resolved.  Nausea remains intermittently.  Discussed potential side effects of the injection can include nausea and flushing. -IM Zofran provided today.  Oral Zofran 4 mg 3 times daily as needed prescribed for home use. -Start Prilosec daily for 14 days. -Consider trial of Dramamine if needed/or B6. - ondansetron (ZOFRAN) injection 4 mg -Follow-up with [facility] next week.    Reviewed expectations re: course of current medical issues.  Discussed  self-management of symptoms.  Outlined signs and symptoms indicating need for more acute intervention.  Patient verbalized understanding and all questions were answered.  Patient received an After-Visit Summary.    No orders of the defined types were placed in this encounter.    Note is dictated utilizing voice recognition software. Although note has been proof read prior to signing, occasional typographical errors still can be missed. If any questions arise, please do not hesitate to call for verification.   electronically signed by:  Howard Pouch, DO  [place]

## 2018-09-15 ENCOUNTER — Encounter: Payer: Self-pay | Admitting: Family Medicine

## 2018-09-18 ENCOUNTER — Encounter: Payer: Self-pay | Admitting: Family Medicine

## 2018-09-18 DIAGNOSIS — J301 Allergic rhinitis due to pollen: Secondary | ICD-10-CM | POA: Diagnosis not present

## 2018-09-18 DIAGNOSIS — J3089 Other allergic rhinitis: Secondary | ICD-10-CM | POA: Diagnosis not present

## 2018-09-18 DIAGNOSIS — J3081 Allergic rhinitis due to animal (cat) (dog) hair and dander: Secondary | ICD-10-CM | POA: Diagnosis not present

## 2018-09-21 DIAGNOSIS — M545 Low back pain: Secondary | ICD-10-CM | POA: Diagnosis not present

## 2018-09-21 DIAGNOSIS — M50322 Other cervical disc degeneration at C5-C6 level: Secondary | ICD-10-CM | POA: Diagnosis not present

## 2018-09-21 DIAGNOSIS — Z3009 Encounter for other general counseling and advice on contraception: Secondary | ICD-10-CM | POA: Diagnosis not present

## 2018-09-21 DIAGNOSIS — M47814 Spondylosis without myelopathy or radiculopathy, thoracic region: Secondary | ICD-10-CM | POA: Diagnosis not present

## 2018-09-21 DIAGNOSIS — M542 Cervicalgia: Secondary | ICD-10-CM | POA: Diagnosis not present

## 2018-09-25 DIAGNOSIS — J301 Allergic rhinitis due to pollen: Secondary | ICD-10-CM | POA: Diagnosis not present

## 2018-09-25 DIAGNOSIS — J3081 Allergic rhinitis due to animal (cat) (dog) hair and dander: Secondary | ICD-10-CM | POA: Diagnosis not present

## 2018-09-25 DIAGNOSIS — J3089 Other allergic rhinitis: Secondary | ICD-10-CM | POA: Diagnosis not present

## 2018-09-25 DIAGNOSIS — F419 Anxiety disorder, unspecified: Secondary | ICD-10-CM | POA: Diagnosis not present

## 2018-09-25 IMAGING — MR MR CERVICAL SPINE W/O CM
5 series · 32 of 48 positions shown · non-contrast
Comparison: None.

CLINICAL DATA: Neck pain. Headaches. Headaches are more severe when
looking down.

EXAM:
MRI CERVICAL SPINE WITHOUT CONTRAST
TECHNIQUE: Multiplanar, multisequence MR imaging of the cervical spine was
performed. No intravenous contrast was administered.

[Series 2: T2 · sagittal · 3.0mm · 0.69mm/px · 5 of 14 slices shown (1 of 3)]
[im 1/14]
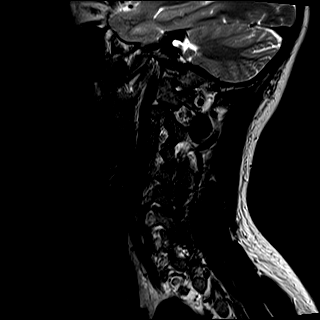
[im 4/14]
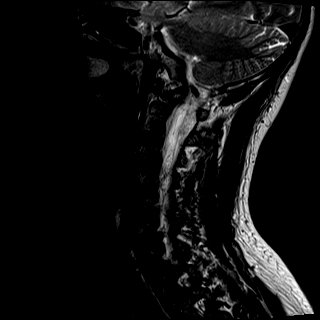
[im 7/14]
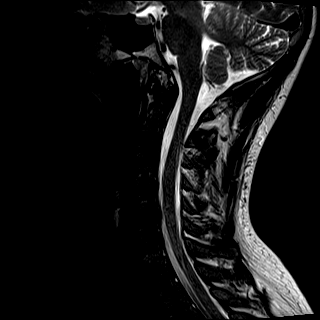
[im 10/14]
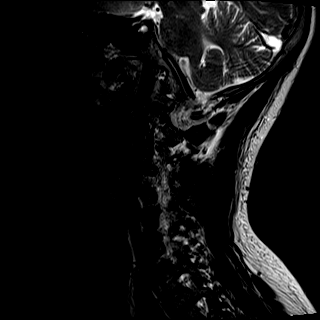
[im 14/14]
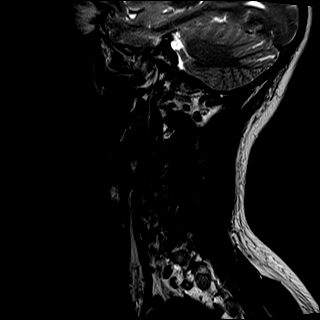

[Series 3: T1 · sagittal · 3.0mm · 0.69mm/px · 5 of 14 slices shown]
[im 1/14]
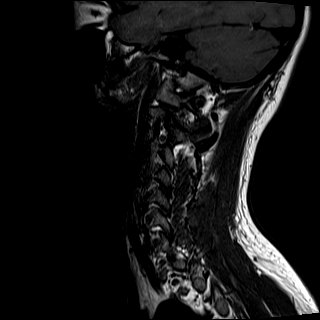
[im 4/14]
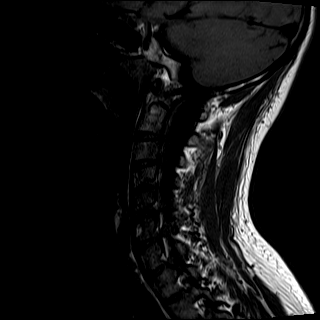
[im 7/14]
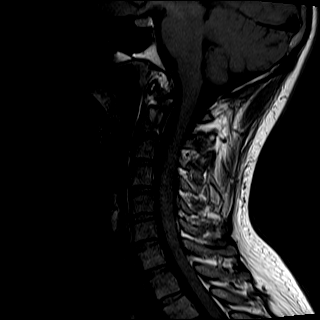
[im 10/14]
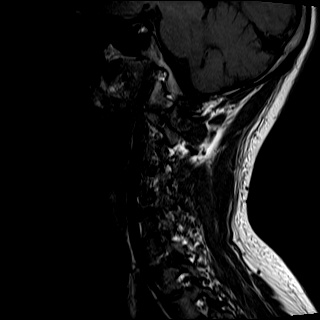
[im 14/14]
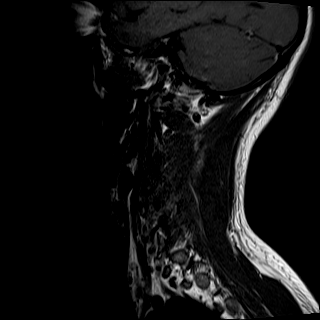

[Series 4: STIR · sagittal · 3.0mm · 0.69mm/px · 2 of 14 slices shown]
[im 1/14]
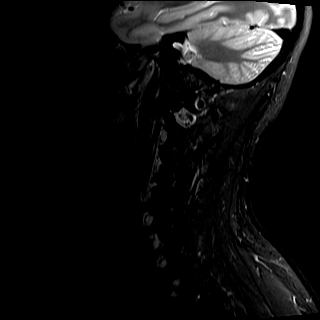
[im 3/14]
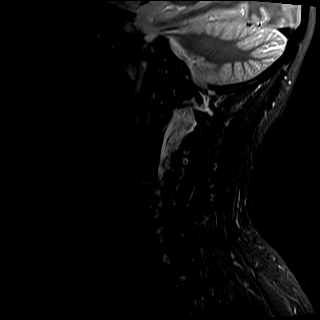

[Series 5: T2 · axial · 3.0mm · 0.62mm/px · z∈[-61,+68]mm · 10 of 40 slices shown (2 of 3)]
[im 3/40]
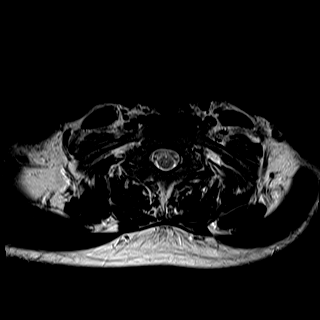
[im 6/40]
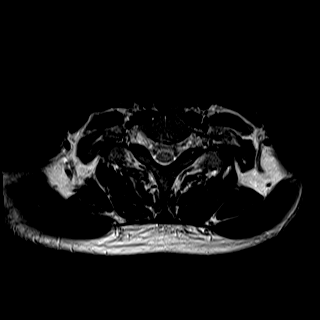
[im 8/40]
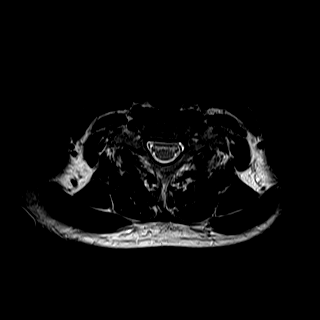
[im 14/40]
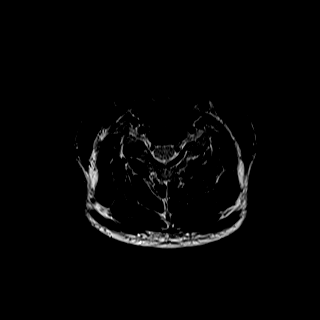
[im 19/40]
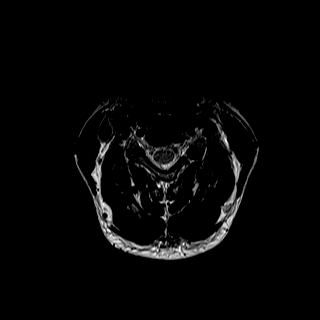
[im 21/40]
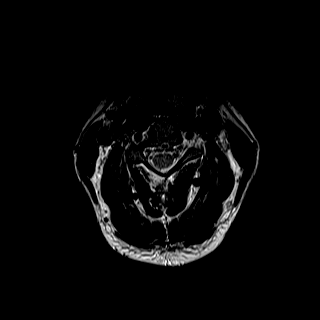
[im 24/40]
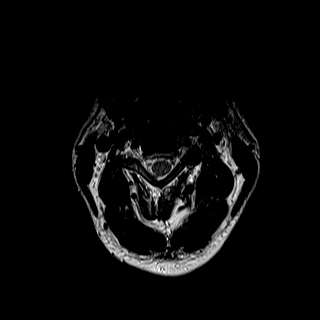
[im 29/40]
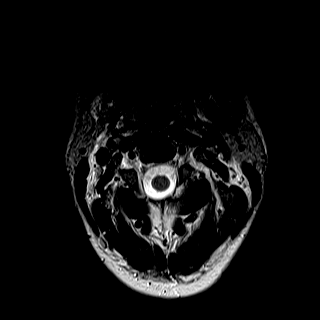
[im 34/40]
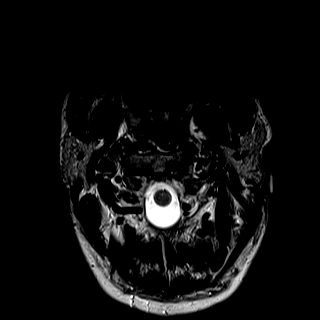
[im 40/40]
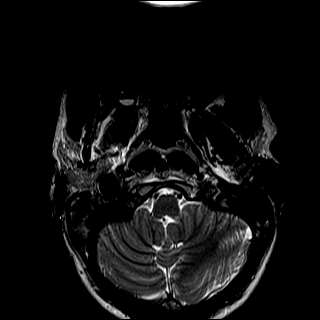

[Series 6: T2 · axial · 3.0mm · 0.39mm/px · z∈[-61,+68]mm · 10 of 40 slices shown (3 of 3)]
[im 3/40]
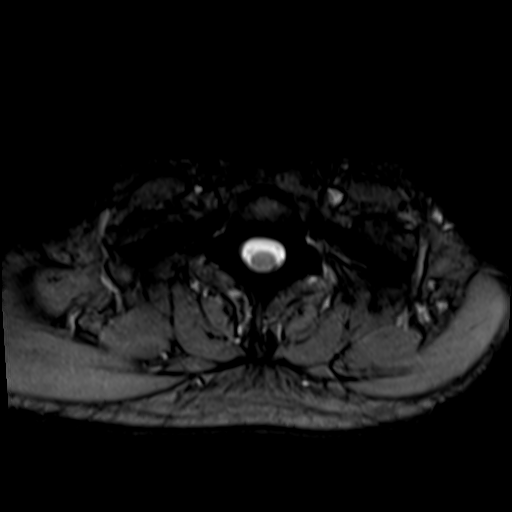
[im 6/40]
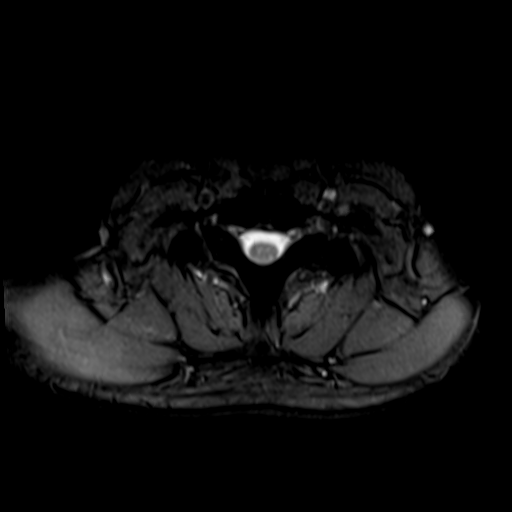
[im 8/40]
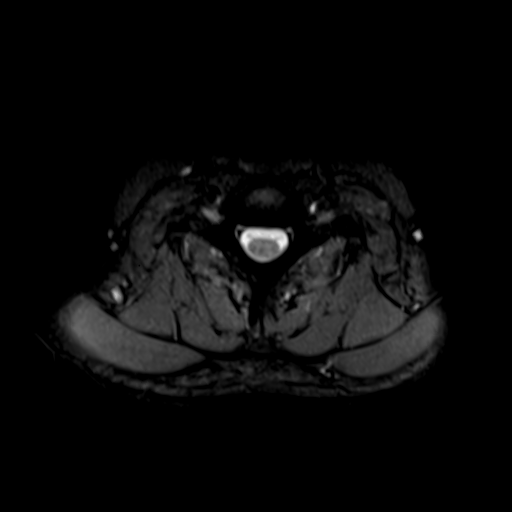
[im 14/40]
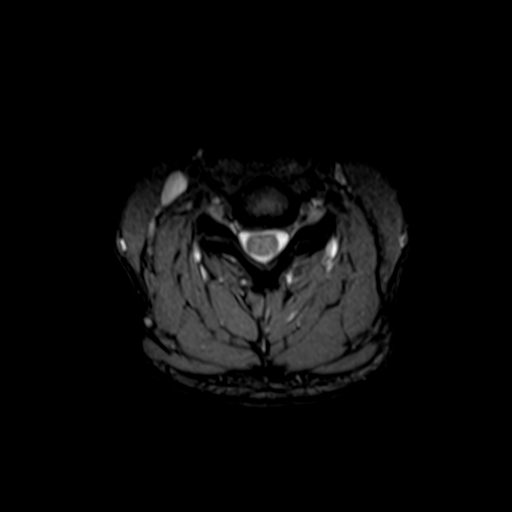
[im 19/40]
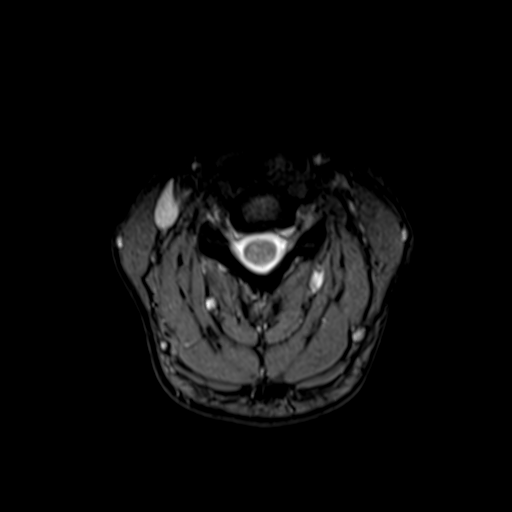
[im 21/40]
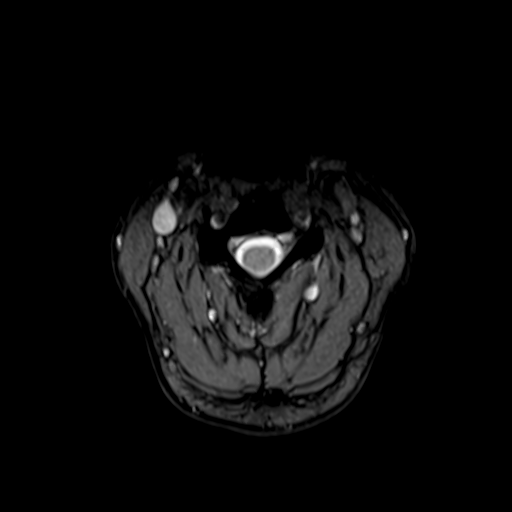
[im 24/40]
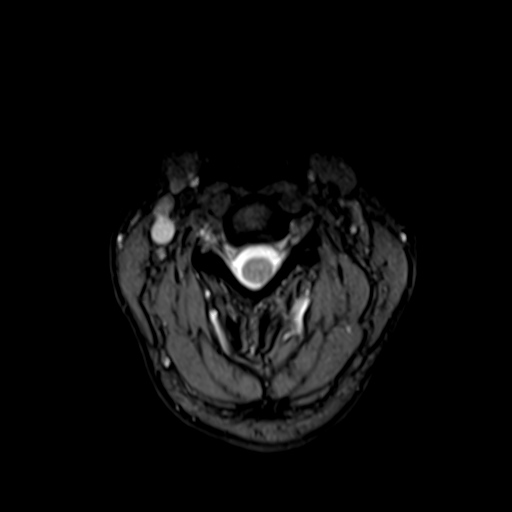
[im 29/40]
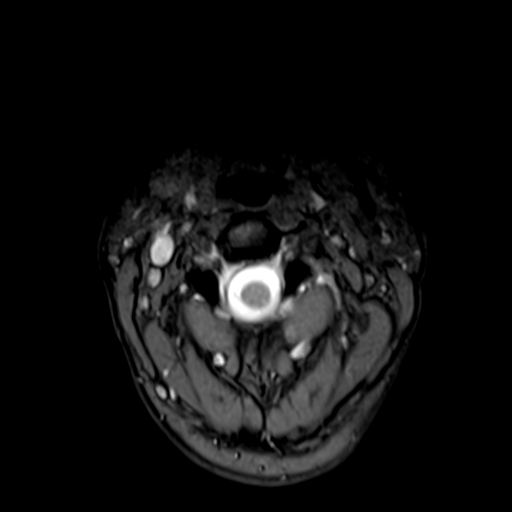
[im 34/40]
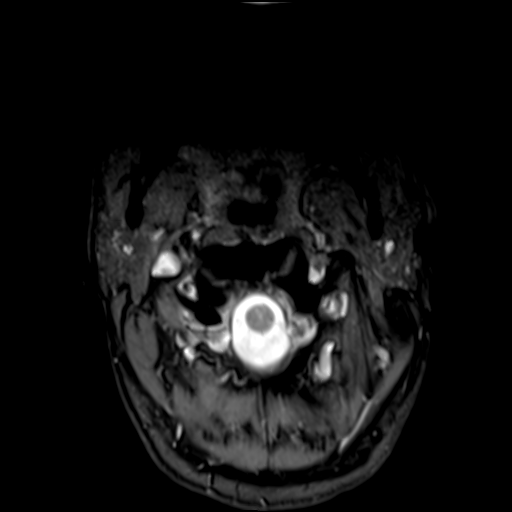
[im 40/40]
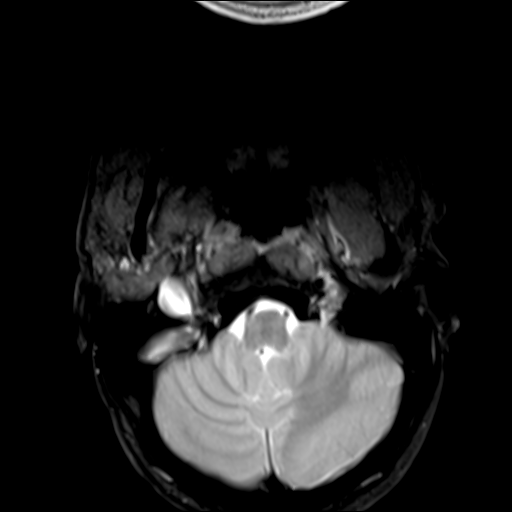

[32 of 48 positions shown; findings below may reference images not displayed]

FINDINGS: Alignment: AP alignment is anatomic.

Vertebrae: Marrow signal and vertebral body heights are normal.

Cord: Normal signal is present in cervical and upper thoracic spinal
cord to the lowest imaged level, T1-2.

Posterior Fossa, vertebral arteries, paraspinal tissues: The
craniocervical junction is normal. Visualized intracranial contents
are normal. Flow is present in the vertebral arteries bilaterally.

Disc levels:

C2-3: Negative.

C3-4: Negative.

C4-5: A mild rightward disc osteophyte complex is present. There is
some uncovertebral spurring on the right. No significant stenosis is
present.

C5-6: A broad-based disc osteophyte complex is present. There is
partial effacement ventral CSF. Uncovertebral spurring contributes
to mild foraminal narrowing bilaterally.

C6-7: Mild bilateral uncovertebral spurring is present. Mild left
foraminal narrowing is evident.

C7-T1: Negative.
IMPRESSION: 1. Mild broad-based disc osteophyte complex at C5-6 results in mild
central and bilateral foraminal narrowing.
2. Mild left foraminal narrowing at C6-7 due to uncovertebral
disease.
3. Asymmetric right sided uncovertebral disease at C4-5 without
significant stenosis.

## 2018-09-27 ENCOUNTER — Encounter: Payer: Self-pay | Admitting: Family Medicine

## 2018-09-28 ENCOUNTER — Other Ambulatory Visit: Payer: Self-pay

## 2018-09-28 ENCOUNTER — Ambulatory Visit: Payer: BLUE CROSS/BLUE SHIELD | Admitting: Neurology

## 2018-09-28 ENCOUNTER — Encounter: Payer: Self-pay | Admitting: Neurology

## 2018-09-28 ENCOUNTER — Encounter

## 2018-09-28 VITALS — BP 115/69 | HR 69 | Resp 14 | Ht 72.0 in | Wt 156.5 lb

## 2018-09-28 DIAGNOSIS — H4710 Unspecified papilledema: Secondary | ICD-10-CM | POA: Diagnosis not present

## 2018-09-28 DIAGNOSIS — M542 Cervicalgia: Secondary | ICD-10-CM | POA: Diagnosis not present

## 2018-09-28 DIAGNOSIS — G4452 New daily persistent headache (NDPH): Secondary | ICD-10-CM

## 2018-09-28 DIAGNOSIS — H539 Unspecified visual disturbance: Secondary | ICD-10-CM

## 2018-09-28 DIAGNOSIS — M40204 Unspecified kyphosis, thoracic region: Secondary | ICD-10-CM

## 2018-09-28 MED ORDER — GABAPENTIN 300 MG PO CAPS: ORAL_CAPSULE | ORAL | 11 refills | Status: DC

## 2018-09-28 NOTE — Progress Notes (Signed)
[facility]  PATIENT: Bernard Dalton DOB: December 19, 1980  REFERRING DOCTOR OR PCP:  Deitra Mayo, PA-C/Patrick Larose Kells, MD (ophth); Dr. Shawnie Dapper (PCP) SOURCE: Patient, notes from PCP and ophthalmology, MRI results, MRI images on PACS.  _________________________________   HISTORICAL  CHIEF COMPLAINT:  Chief Complaint  Patient presents with  . Pappilledema    Sts. h/a's are about the same. He saw Dr. [name] and had an MRI that he sts. shows a bulging disc in his neck. Sts. Dr. [name] felt this may be causing neck pain and h/a, so he performed an esi which has provided about 25-30% relief. Sts. he has had constand nausea since esi, is still unable to sit for prolonged periods, and has more pain with looking down./fim  . Headache  . Neck Pain    HISTORY OF PRESENT ILLNESS:  Bernard Dalton is a 37 y.o. man with headaches and papilledema.  Update 09/28/2018: He reports headaches are about the same.  Pain is worse with looking down.   He was felt to have possible papilledema initially but follow up did not show any.   In the interim, he had a normal LP.     He did PT but never had any lasting benefit.   He was referred to Dr. [name] / spine surgeon.    He had cervical spine ESI and felt he got 20-30% improvement. However after the injection, he has had nausea.    He goes back to see Dr. [name] in a couple weeks.      He is also having more thoracic pain.  He has kyphosis in the upper thoracic spine.  He is on gabapentin.   He wasn't sure it was helping.    Imipramine had not helped.    Flexeril helped him sleep but did not help daytime   I personally reviewed the cervical spine MRI from 08/21/2018.   It shows C5C6 > C4C5/C6C7 DJD/DDD but only mild foraminal narrowing.   No spinal stenosis or nerve root compression    Update 05/25/2018: I saw Bernard Dalton about 6 weeks ago for his severe headaches.  Although he had papilledema on the OCT test done with ophthalmology, I did not see any  evidence of papilledema during my exam.   We did a LP and the OP was 185 mm.    I added imipramine and he noted no improvement after a few weeks so he stopped.     However, over time the headaches are improving.  Symptoms seem worse with sitting.   He saw a spine doctor who told him his spine looked ok.    He started PT and Flexeril.    He felt the Flexeril helped some.   Dry needling has helped some but furhter improvement has stopped.      He is back to work and more functional.   Now, most days, he has no headache in the morning.  He gets headaches, now milder, as the day goes on.    HA is worse with sitting so he uses a standing desk and rarely sits except to drive.     From 04/13/2018:  For the past 2 months, he has had daily headaches.  Pain is behind his eyes, left = right.  When pain is worsening, he gets nauseous.   Changing gaze does not affect the pain.    The headaches are mild in the morning and worsen as the day goes on.    Also worsens with looking  at a computer or other screen.     Pain is worse when sitting a longer time and better with standing.    When sitting a while he feels that the vision worsens. Moving his head does not change intensity.   When more severe he gets nausea but no photophobia or phonophobia.   He has mild neck pain at times. He notes no change in color vision.   He denies any diplopia.  Sometimes he notes minimal tinnitus.  He does not have blackening of his vision or presyncope upon standing up.  Initially, migraines were suspected and rizatriptan was tried without.    Notes from Dr. [name] were reviewed.    Because the headaches would be associated with visual changes were more intense, he saw his ophthalmologist.  I reviewed the notes.  Visual acuity was 20/20 with glasses.  External and internal ocular exam was normal.  Specifically, the optic nerve appeared to be normal.  However, OCT showed optic nerve bulging worrisome for papilledema.  Formal visual field  testing was not done.     He also saw an allergist, Dr. [name], and was told that the headaches were unlikely to be related to his seasonal allergies.    Due to continuing symptoms and the possibility of papilledema, he is referred for further evaluation.  I personally reviewed the MRI images of the brain with and without contrast and reviewed the report from 04/08/2018.   The brain is essentially normal.  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    I did not note any changes in the pituitary gland of the sella turcica.  Additionally the optic nerve sheaths had normal diameter.   Many patients with elevated intracranial pressure will have a flattened pituitary gland, enlarged sella turcica and/or widened optic nerve sheaths.  He denies numbness, weakness or clumsiness.   He has mild neck and back pain and was told he has kyphosis.   However, no change in intensity recently.  He has no cognitive change in general but last month had one episode where he felt he was a little confused.   He has ADD helped by Adderall  He had urinary urgency a few years ago and sore urology.  He was diagnosed with a mildly enlarged prostate and interstitial cystitis.  He takes Elmiron.   He gets more anxious when urgency is present and takes Xanax if symptoms are worse.    REVIEW OF SYSTEMS: Constitutional: No fevers, chills, sweats, or change in appetite Eyes: No visual changes, double vision, eye pain Ear, nose and throat: No hearing loss, ear pain, nasal congestion, sore throat Cardiovascular: No chest pain, palpitations Respiratory: No shortness of breath at rest or with exertion.   No wheezes GastrointestinaI: No nausea, vomiting, diarrhea, abdominal pain, fecal incontinence Genitourinary:He has had urinary urgency and frequency.  He was diagnosed with interstitial cystitis and is doing better on Elmiron.   Hydroxyzine helps his urinary symptoms at night..   He also has a history of small kidney stones. Musculoskeletal:He has upper thoracic greater than lower back pain.  There is mild neck pain.   Integumentary: No rash, pruritus, skin lesions Neurological: as above Psychiatric: Denies depression.  Notes some anxiety. Endocrine: No palpitations, diaphoresis, change in appetite, change in weigh or increased thirst Hematologic/Lymphatic: No anemia, purpura, petechiae. Allergic/Immunologic: He has seasonal allergies.   ALLERGIES: Allergies  Allergen  Reactions  . Prednisone Other (See Comments)    Severe anxiety, cognitive impairment, "hallucinations"---"I never want to take that med again"    HOME MEDICATIONS:  Current Outpatient Medications:  .  ADDERALL XR 10 MG 24 hr capsule, Take 10 mg by mouth daily., Disp: , Rfl: 0 .  ALPRAZolam (XANAX) 0.25 MG tablet, Take 1 tablet by mouth daily as needed. Reported on 02/20/2016, Disp: , Rfl: 2 .  fexofenadine (ALLEGRA) 180 MG tablet, Take 180 mg by mouth daily., Disp: , Rfl:  .  fluticasone (FLONASE) 50 MCG/ACT nasal spray, Place into both nostrils daily., Disp: , Rfl:  .  gabapentin (NEURONTIN) 300 MG capsule, Take one po qAM, one po qPM and teo po qHS, Disp: 120 capsule, Rfl: 11  PAST MEDICAL HISTORY: Past Medical History:  Diagnosis Date  . ADD (attention deficit disorder)   . Allergic rhinitis    + allerg conjunctivitis  . Anxiety and depression   . Chronic prostatitis    chronic irritative voiding symptoms: pelvic PT via [facility] helping as of 07/2016  . DDD (degenerative disc disease), cervical 08/2018   (Dr. Maia Petties):  C5-C6 --ESI 25% improvement in fall 2019.  Pt desiring C spine surgery to see if this alleviates his symptom complex..  . Diverticulosis    with 'itis per pt report; however, review of old record CT reports shows mild SB enteritis with mesenteric adenopathy 06/2015; then 11/2014 CT abd/pelv done for urinary  frequency and hematuria showed NORMAL abd/pelv  . Elevation of optic disc, bilateral 2019   Normal variant per ophtho--Dr. Larose Kells.  Marland Kitchen Headache syndrome 02/2018   Chronic intractable headaches of unknown etiology (occipital): 03/2018 MRI normal: ? papilledema on optho exam.  Dr. [name] with neuro saw him 04/2018, no papilledema noted and opening CSF pressure was normal.  CSF labs normal.  ESR and ANA normal.  No clear reason for HA's found.  Imiprimine trial no help.  Trigger point injections in occipital mm's 05/2018 by Dr. [name].--no help.   Marland Kitchen Headache syndrome 02/2018   As of 07/2018, pt has plan to see Dr. [name], NS/spine specialist as next step (his HAs are likely cervicogenic).  Dr. Maia Petties did C7-T1 ESI on 09/11/18--25% improvement.   . IBS (irritable bowel syndrome)    diarr predom as per old PCP records  . Nephrolithiasis    Nonobstructive, noted on CT (1-2 mm)-has never passed a stone that he knows of.  . 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 Spondylosis without myelopathy or radiculopathy, thoracic region 2019  . Vision abnormalities     PAST SURGICAL HISTORY: Past Surgical History:  Procedure Laterality Date  . LASIK Bilateral 2011  . REFRACTIVE SURGERY Bilateral     FAMILY HISTORY: Family History  Problem Relation Age of Onset  . Alcohol abuse Mother   . Alcohol abuse Father   . Kidney disease Father   . Alcohol abuse Maternal Grandmother   . Alcohol abuse Maternal Grandfather   . Stroke Paternal Grandmother   . Congestive Heart Failure Paternal Grandmother   . Alcohol abuse Paternal Grandmother   . Alcohol abuse Paternal Grandfather   . ADD / ADHD Brother   . ADD / ADHD Brother     SOCIAL HISTORY:  Social History   Socioeconomic History  . Marital status: Married    Spouse name: Not on file  . Number of children: Not on file  . Years of education: Not on file  . Highest education  level: Not on file  Occupational History  . Not on file    Social Needs  . Financial resource strain: Not on file  . Food insecurity:    Worry: Not on file    Inability: Not on file  . Transportation needs:    Medical: Not on file    Non-medical: Not on file  Tobacco Use  . Smoking status: Never Smoker  . Smokeless tobacco: Never Used  Substance and Sexual Activity  . Alcohol use: No  . Drug use: No  . Sexual activity: Not on file  Lifestyle  . Physical activity:    Days per week: Not on file    Minutes per session: Not on file  . Stress: Not on file  Relationships  . Social connections:    Talks on phone: Not on file    Gets together: Not on file    Attends religious service: Not on file    Active member of club or organization: Not on file    Attends meetings of clubs or organizations: Not on file    Relationship status: Not on file  . Intimate partner violence:    Fear of current or ex partner: Not on file    Emotionally abused: Not on file    Physically abused: Not on file    Forced sexual activity: Not on file  Other Topics Concern  . Not on file  Social History Narrative   Married, 1 child and 1 on the way.   Relocated from [facility] to [facility] 08/2015.   Educ: college at [employer].   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   No T/A/Ds.     PHYSICAL EXAM  Vitals:   09/28/18 1440  BP: 115/69  Pulse: 69  Resp: 14  Weight: 156 lb 8 oz (71 kg)  Height: 6' (1.829 m)    Body mass index is 21.23 kg/m.   General: The patient is well-developed and well-nourished and in no acute distress  Eyes: On funduscopic examination, he has normal optic disks and retinal vessels.  Venous pulsations are present.    Neck: He has mild to moderate bicipital tenderness.    Neurologic Exam  Mental status: The patient is alert and oriented x 3 at the time of the examination. The patient has apparent normal recent and remote memory, with an apparently normal attention span and concentration ability.   Speech is  normal.  Cranial nerves: Extraocular movements are full.  Pupils are equal round and reactive to light and accommodation.  Facial strength and sensation was normal..  Trapezius strength is normal.   No dysarthria is noted.  The tongue is midline, and the patient has symmetric elevation of the soft palate. No obvious hearing deficits are noted.  Motor:  Muscle bulk is normal.   Tone is normal. Strength is  5 / 5 in all 4 extremities.   Sensory: Sensory testing is intact to pinprick, soft touch and vibration sensation in all 4 extremities.  Coordination: Cerebellar testing reveals good finger-nose-finger   Gait and station: Station is normal.   Gait and tandem gait are normal.  Reflexes: Deep tendon reflexes were normal and symmetric.      DIAGNOSTIC DATA (LABS, IMAGING, TESTING) - I reviewed patient records, labs, notes, testing and imaging myself where available.  Lab Results  Component Value Date   WBC 4.3 07/29/2016   HGB 15.0 07/29/2016   HCT 44.3 07/29/2016   MCV 87.5 07/29/2016   PLT  178 07/29/2016      Component Value Date/Time   NA 142 07/29/2016 0803   K 4.6 07/29/2016 0803   CL 106 07/29/2016 0803   CO2 25 07/29/2016 0803   GLUCOSE 96 07/29/2016 0803   BUN 10 07/29/2016 0803   CREATININE 1.04 07/29/2016 0803   CALCIUM 9.7 07/29/2016 0803   PROT 6.3 07/29/2016 0803   ALBUMIN 4.3 07/29/2016 0803   AST 10 07/29/2016 0803   ALT 10 07/29/2016 0803   ALKPHOS 47 07/29/2016 0803   BILITOT 1.1 07/29/2016 0803   No results found for: CHOL, HDL, LDLCALC, LDLDIRECT, TRIG, CHOLHDL Lab Results  Component Value Date   HGBA1C 4.7 07/29/2016   No results found for: VITAMINB12 Lab Results  Component Value Date   TSH 1.31 07/29/2016    ____________________________________      ASSESSMENT AND PLAN  New persistent daily headache  Vision disturbance  Neck pain  Papilledema  Kyphosis of thoracic region, unspecified kyphosis type  1.   Increase gabapentin to  300-300-600 (1200 mg daily  2.    We discussed getting a brace for the thoracic spine which might help posture and upper thoracic and cervical pain. 3.    He will be following up with the spine surgeon in 2 to 3 weeks.. 4.    I will see him back as needed if the headache does not continue to improve or if he has new or worsening symptoms.  Richard A. [name], MD, [facility] 90/47/5339, 1:79 PM Certified in Neurology, Clinical Neurophysiology, Sleep Medicine, Pain Medicine and Neuroimaging  [facility] [address], [place], [place] [address] [phone]

## 2018-09-29 ENCOUNTER — Other Ambulatory Visit: Payer: Self-pay | Admitting: Rehabilitation

## 2018-09-29 DIAGNOSIS — M47814 Spondylosis without myelopathy or radiculopathy, thoracic region: Secondary | ICD-10-CM

## 2018-10-02 DIAGNOSIS — J3089 Other allergic rhinitis: Secondary | ICD-10-CM | POA: Diagnosis not present

## 2018-10-02 DIAGNOSIS — J301 Allergic rhinitis due to pollen: Secondary | ICD-10-CM | POA: Diagnosis not present

## 2018-10-02 DIAGNOSIS — J3081 Allergic rhinitis due to animal (cat) (dog) hair and dander: Secondary | ICD-10-CM | POA: Diagnosis not present

## 2018-10-09 DIAGNOSIS — F419 Anxiety disorder, unspecified: Secondary | ICD-10-CM | POA: Diagnosis not present

## 2018-10-09 DIAGNOSIS — J3081 Allergic rhinitis due to animal (cat) (dog) hair and dander: Secondary | ICD-10-CM | POA: Diagnosis not present

## 2018-10-09 DIAGNOSIS — J3089 Other allergic rhinitis: Secondary | ICD-10-CM | POA: Diagnosis not present

## 2018-10-09 DIAGNOSIS — J301 Allergic rhinitis due to pollen: Secondary | ICD-10-CM | POA: Diagnosis not present

## 2018-10-11 DIAGNOSIS — M50322 Other cervical disc degeneration at C5-C6 level: Secondary | ICD-10-CM | POA: Diagnosis not present

## 2018-10-11 DIAGNOSIS — M542 Cervicalgia: Secondary | ICD-10-CM | POA: Diagnosis not present

## 2018-10-11 DIAGNOSIS — M40205 Unspecified kyphosis, thoracolumbar region: Secondary | ICD-10-CM | POA: Diagnosis not present

## 2018-10-16 DIAGNOSIS — J3081 Allergic rhinitis due to animal (cat) (dog) hair and dander: Secondary | ICD-10-CM | POA: Diagnosis not present

## 2018-10-16 DIAGNOSIS — J301 Allergic rhinitis due to pollen: Secondary | ICD-10-CM | POA: Diagnosis not present

## 2018-10-16 DIAGNOSIS — J3089 Other allergic rhinitis: Secondary | ICD-10-CM | POA: Diagnosis not present

## 2018-10-17 ENCOUNTER — Ambulatory Visit
Admission: RE | Admit: 2018-10-17 | Discharge: 2018-10-17 | Disposition: A | Discharge status: Home / Self Care | Payer: BLUE CROSS/BLUE SHIELD | Source: Ambulatory Visit | Attending: Rehabilitation | Admitting: Rehabilitation

## 2018-10-17 DIAGNOSIS — M5124 Other intervertebral disc displacement, thoracic region: Secondary | ICD-10-CM | POA: Diagnosis not present

## 2018-10-17 DIAGNOSIS — M47814 Spondylosis without myelopathy or radiculopathy, thoracic region: Secondary | ICD-10-CM

## 2018-10-19 DIAGNOSIS — F900 Attention-deficit hyperactivity disorder, predominantly inattentive type: Secondary | ICD-10-CM | POA: Diagnosis not present

## 2018-10-19 DIAGNOSIS — F329 Major depressive disorder, single episode, unspecified: Secondary | ICD-10-CM | POA: Diagnosis not present

## 2018-10-19 DIAGNOSIS — F411 Generalized anxiety disorder: Secondary | ICD-10-CM | POA: Diagnosis not present

## 2018-10-23 DIAGNOSIS — J3089 Other allergic rhinitis: Secondary | ICD-10-CM | POA: Diagnosis not present

## 2018-10-23 DIAGNOSIS — J3081 Allergic rhinitis due to animal (cat) (dog) hair and dander: Secondary | ICD-10-CM | POA: Diagnosis not present

## 2018-10-23 DIAGNOSIS — J301 Allergic rhinitis due to pollen: Secondary | ICD-10-CM | POA: Diagnosis not present

## 2018-10-23 DIAGNOSIS — H1045 Other chronic allergic conjunctivitis: Secondary | ICD-10-CM | POA: Diagnosis not present

## 2018-10-25 DIAGNOSIS — M542 Cervicalgia: Secondary | ICD-10-CM | POA: Diagnosis not present

## 2018-10-25 DIAGNOSIS — M40205 Unspecified kyphosis, thoracolumbar region: Secondary | ICD-10-CM | POA: Diagnosis not present

## 2018-10-26 DIAGNOSIS — M47892 Other spondylosis, cervical region: Secondary | ICD-10-CM | POA: Diagnosis not present

## 2018-10-26 DIAGNOSIS — M542 Cervicalgia: Secondary | ICD-10-CM | POA: Diagnosis not present

## 2018-10-31 DIAGNOSIS — J3081 Allergic rhinitis due to animal (cat) (dog) hair and dander: Secondary | ICD-10-CM | POA: Diagnosis not present

## 2018-10-31 DIAGNOSIS — J3089 Other allergic rhinitis: Secondary | ICD-10-CM | POA: Diagnosis not present

## 2018-10-31 DIAGNOSIS — F419 Anxiety disorder, unspecified: Secondary | ICD-10-CM | POA: Diagnosis not present

## 2018-10-31 DIAGNOSIS — J301 Allergic rhinitis due to pollen: Secondary | ICD-10-CM | POA: Diagnosis not present

## 2018-11-03 DIAGNOSIS — M542 Cervicalgia: Secondary | ICD-10-CM | POA: Diagnosis not present

## 2018-11-06 DIAGNOSIS — J3089 Other allergic rhinitis: Secondary | ICD-10-CM | POA: Diagnosis not present

## 2018-11-06 DIAGNOSIS — J301 Allergic rhinitis due to pollen: Secondary | ICD-10-CM | POA: Diagnosis not present

## 2018-11-06 DIAGNOSIS — J3081 Allergic rhinitis due to animal (cat) (dog) hair and dander: Secondary | ICD-10-CM | POA: Diagnosis not present

## 2018-11-13 ENCOUNTER — Encounter: Payer: Self-pay | Admitting: Family Medicine

## 2018-11-14 DIAGNOSIS — J3089 Other allergic rhinitis: Secondary | ICD-10-CM | POA: Diagnosis not present

## 2018-11-14 DIAGNOSIS — F419 Anxiety disorder, unspecified: Secondary | ICD-10-CM | POA: Diagnosis not present

## 2018-11-14 DIAGNOSIS — M47812 Spondylosis without myelopathy or radiculopathy, cervical region: Secondary | ICD-10-CM | POA: Diagnosis not present

## 2018-11-14 DIAGNOSIS — J3081 Allergic rhinitis due to animal (cat) (dog) hair and dander: Secondary | ICD-10-CM | POA: Diagnosis not present

## 2018-11-14 DIAGNOSIS — J301 Allergic rhinitis due to pollen: Secondary | ICD-10-CM | POA: Diagnosis not present

## 2018-11-20 DIAGNOSIS — J3089 Other allergic rhinitis: Secondary | ICD-10-CM | POA: Diagnosis not present

## 2018-11-20 DIAGNOSIS — J3081 Allergic rhinitis due to animal (cat) (dog) hair and dander: Secondary | ICD-10-CM | POA: Diagnosis not present

## 2018-11-20 DIAGNOSIS — J301 Allergic rhinitis due to pollen: Secondary | ICD-10-CM | POA: Diagnosis not present

## 2018-11-22 DIAGNOSIS — M47812 Spondylosis without myelopathy or radiculopathy, cervical region: Secondary | ICD-10-CM | POA: Diagnosis not present

## 2018-11-24 DIAGNOSIS — M542 Cervicalgia: Secondary | ICD-10-CM | POA: Diagnosis not present

## 2018-11-28 DIAGNOSIS — J3081 Allergic rhinitis due to animal (cat) (dog) hair and dander: Secondary | ICD-10-CM | POA: Diagnosis not present

## 2018-11-28 DIAGNOSIS — F419 Anxiety disorder, unspecified: Secondary | ICD-10-CM | POA: Diagnosis not present

## 2018-11-28 DIAGNOSIS — J301 Allergic rhinitis due to pollen: Secondary | ICD-10-CM | POA: Diagnosis not present

## 2018-11-28 DIAGNOSIS — J3089 Other allergic rhinitis: Secondary | ICD-10-CM | POA: Diagnosis not present

## 2018-12-01 DIAGNOSIS — M542 Cervicalgia: Secondary | ICD-10-CM | POA: Diagnosis not present

## 2018-12-04 DIAGNOSIS — J3089 Other allergic rhinitis: Secondary | ICD-10-CM | POA: Diagnosis not present

## 2018-12-04 DIAGNOSIS — J3081 Allergic rhinitis due to animal (cat) (dog) hair and dander: Secondary | ICD-10-CM | POA: Diagnosis not present

## 2018-12-04 DIAGNOSIS — J301 Allergic rhinitis due to pollen: Secondary | ICD-10-CM | POA: Diagnosis not present

## 2018-12-11 DIAGNOSIS — J301 Allergic rhinitis due to pollen: Secondary | ICD-10-CM | POA: Diagnosis not present

## 2018-12-11 DIAGNOSIS — M542 Cervicalgia: Secondary | ICD-10-CM | POA: Diagnosis not present

## 2018-12-11 DIAGNOSIS — J3089 Other allergic rhinitis: Secondary | ICD-10-CM | POA: Diagnosis not present

## 2018-12-11 DIAGNOSIS — J3081 Allergic rhinitis due to animal (cat) (dog) hair and dander: Secondary | ICD-10-CM | POA: Diagnosis not present

## 2018-12-12 DIAGNOSIS — J3089 Other allergic rhinitis: Secondary | ICD-10-CM | POA: Diagnosis not present

## 2018-12-13 DIAGNOSIS — R1013 Epigastric pain: Secondary | ICD-10-CM

## 2018-12-13 HISTORY — DX: Epigastric pain: R10.13

## 2018-12-14 DIAGNOSIS — F419 Anxiety disorder, unspecified: Secondary | ICD-10-CM | POA: Diagnosis not present

## 2018-12-18 DIAGNOSIS — J3081 Allergic rhinitis due to animal (cat) (dog) hair and dander: Secondary | ICD-10-CM | POA: Diagnosis not present

## 2018-12-18 DIAGNOSIS — J3089 Other allergic rhinitis: Secondary | ICD-10-CM | POA: Diagnosis not present

## 2018-12-18 DIAGNOSIS — M542 Cervicalgia: Secondary | ICD-10-CM | POA: Diagnosis not present

## 2018-12-18 DIAGNOSIS — J301 Allergic rhinitis due to pollen: Secondary | ICD-10-CM | POA: Diagnosis not present

## 2018-12-25 DIAGNOSIS — J3081 Allergic rhinitis due to animal (cat) (dog) hair and dander: Secondary | ICD-10-CM | POA: Diagnosis not present

## 2018-12-25 DIAGNOSIS — M542 Cervicalgia: Secondary | ICD-10-CM | POA: Diagnosis not present

## 2018-12-25 DIAGNOSIS — J301 Allergic rhinitis due to pollen: Secondary | ICD-10-CM | POA: Diagnosis not present

## 2018-12-25 DIAGNOSIS — J3089 Other allergic rhinitis: Secondary | ICD-10-CM | POA: Diagnosis not present

## 2018-12-27 DIAGNOSIS — M47812 Spondylosis without myelopathy or radiculopathy, cervical region: Secondary | ICD-10-CM | POA: Diagnosis not present

## 2018-12-28 DIAGNOSIS — F419 Anxiety disorder, unspecified: Secondary | ICD-10-CM | POA: Diagnosis not present

## 2019-01-01 DIAGNOSIS — J3089 Other allergic rhinitis: Secondary | ICD-10-CM | POA: Diagnosis not present

## 2019-01-01 DIAGNOSIS — J3081 Allergic rhinitis due to animal (cat) (dog) hair and dander: Secondary | ICD-10-CM | POA: Diagnosis not present

## 2019-01-01 DIAGNOSIS — J301 Allergic rhinitis due to pollen: Secondary | ICD-10-CM | POA: Diagnosis not present

## 2019-01-05 DIAGNOSIS — M542 Cervicalgia: Secondary | ICD-10-CM | POA: Diagnosis not present

## 2019-01-08 DIAGNOSIS — J3081 Allergic rhinitis due to animal (cat) (dog) hair and dander: Secondary | ICD-10-CM | POA: Diagnosis not present

## 2019-01-08 DIAGNOSIS — J301 Allergic rhinitis due to pollen: Secondary | ICD-10-CM | POA: Diagnosis not present

## 2019-01-08 DIAGNOSIS — J3089 Other allergic rhinitis: Secondary | ICD-10-CM | POA: Diagnosis not present

## 2019-01-10 DIAGNOSIS — M40294 Other kyphosis, thoracic region: Secondary | ICD-10-CM | POA: Diagnosis not present

## 2019-01-10 DIAGNOSIS — F419 Anxiety disorder, unspecified: Secondary | ICD-10-CM | POA: Diagnosis not present

## 2019-01-10 DIAGNOSIS — M47812 Spondylosis without myelopathy or radiculopathy, cervical region: Secondary | ICD-10-CM | POA: Diagnosis not present

## 2019-01-10 DIAGNOSIS — R030 Elevated blood-pressure reading, without diagnosis of hypertension: Secondary | ICD-10-CM | POA: Diagnosis not present

## 2019-01-15 DIAGNOSIS — J3089 Other allergic rhinitis: Secondary | ICD-10-CM | POA: Diagnosis not present

## 2019-01-15 DIAGNOSIS — J301 Allergic rhinitis due to pollen: Secondary | ICD-10-CM | POA: Diagnosis not present

## 2019-01-15 DIAGNOSIS — J3081 Allergic rhinitis due to animal (cat) (dog) hair and dander: Secondary | ICD-10-CM | POA: Diagnosis not present

## 2019-01-17 DIAGNOSIS — F411 Generalized anxiety disorder: Secondary | ICD-10-CM | POA: Diagnosis not present

## 2019-01-17 DIAGNOSIS — F900 Attention-deficit hyperactivity disorder, predominantly inattentive type: Secondary | ICD-10-CM | POA: Diagnosis not present

## 2019-01-17 DIAGNOSIS — F329 Major depressive disorder, single episode, unspecified: Secondary | ICD-10-CM | POA: Diagnosis not present

## 2019-01-22 DIAGNOSIS — J301 Allergic rhinitis due to pollen: Secondary | ICD-10-CM | POA: Diagnosis not present

## 2019-01-22 DIAGNOSIS — J3089 Other allergic rhinitis: Secondary | ICD-10-CM | POA: Diagnosis not present

## 2019-01-22 DIAGNOSIS — J3081 Allergic rhinitis due to animal (cat) (dog) hair and dander: Secondary | ICD-10-CM | POA: Diagnosis not present

## 2019-01-24 DIAGNOSIS — M542 Cervicalgia: Secondary | ICD-10-CM | POA: Diagnosis not present

## 2019-01-24 DIAGNOSIS — M50322 Other cervical disc degeneration at C5-C6 level: Secondary | ICD-10-CM | POA: Diagnosis not present

## 2019-01-24 DIAGNOSIS — M47812 Spondylosis without myelopathy or radiculopathy, cervical region: Secondary | ICD-10-CM | POA: Diagnosis not present

## 2019-01-25 DIAGNOSIS — F419 Anxiety disorder, unspecified: Secondary | ICD-10-CM | POA: Diagnosis not present

## 2019-01-29 DIAGNOSIS — M542 Cervicalgia: Secondary | ICD-10-CM | POA: Diagnosis not present

## 2019-02-01 DIAGNOSIS — J301 Allergic rhinitis due to pollen: Secondary | ICD-10-CM | POA: Diagnosis not present

## 2019-02-01 DIAGNOSIS — J3089 Other allergic rhinitis: Secondary | ICD-10-CM | POA: Diagnosis not present

## 2019-02-01 DIAGNOSIS — J3081 Allergic rhinitis due to animal (cat) (dog) hair and dander: Secondary | ICD-10-CM | POA: Diagnosis not present

## 2019-02-02 DIAGNOSIS — M542 Cervicalgia: Secondary | ICD-10-CM | POA: Diagnosis not present

## 2019-02-05 DIAGNOSIS — J3089 Other allergic rhinitis: Secondary | ICD-10-CM | POA: Diagnosis not present

## 2019-02-05 DIAGNOSIS — J301 Allergic rhinitis due to pollen: Secondary | ICD-10-CM | POA: Diagnosis not present

## 2019-02-05 DIAGNOSIS — J3081 Allergic rhinitis due to animal (cat) (dog) hair and dander: Secondary | ICD-10-CM | POA: Diagnosis not present

## 2019-02-05 DIAGNOSIS — M542 Cervicalgia: Secondary | ICD-10-CM | POA: Diagnosis not present

## 2019-02-08 DIAGNOSIS — J3089 Other allergic rhinitis: Secondary | ICD-10-CM | POA: Diagnosis not present

## 2019-02-08 DIAGNOSIS — J3081 Allergic rhinitis due to animal (cat) (dog) hair and dander: Secondary | ICD-10-CM | POA: Diagnosis not present

## 2019-02-08 DIAGNOSIS — J301 Allergic rhinitis due to pollen: Secondary | ICD-10-CM | POA: Diagnosis not present

## 2019-02-08 DIAGNOSIS — F419 Anxiety disorder, unspecified: Secondary | ICD-10-CM | POA: Diagnosis not present

## 2019-02-09 DIAGNOSIS — M542 Cervicalgia: Secondary | ICD-10-CM | POA: Diagnosis not present

## 2019-02-12 DIAGNOSIS — M542 Cervicalgia: Secondary | ICD-10-CM | POA: Diagnosis not present

## 2019-02-14 DIAGNOSIS — J3081 Allergic rhinitis due to animal (cat) (dog) hair and dander: Secondary | ICD-10-CM | POA: Diagnosis not present

## 2019-02-14 DIAGNOSIS — J3089 Other allergic rhinitis: Secondary | ICD-10-CM | POA: Diagnosis not present

## 2019-02-14 DIAGNOSIS — J301 Allergic rhinitis due to pollen: Secondary | ICD-10-CM | POA: Diagnosis not present

## 2019-02-16 DIAGNOSIS — M542 Cervicalgia: Secondary | ICD-10-CM | POA: Diagnosis not present

## 2019-02-19 DIAGNOSIS — M542 Cervicalgia: Secondary | ICD-10-CM | POA: Diagnosis not present

## 2019-02-19 DIAGNOSIS — J3089 Other allergic rhinitis: Secondary | ICD-10-CM | POA: Diagnosis not present

## 2019-02-19 DIAGNOSIS — J3081 Allergic rhinitis due to animal (cat) (dog) hair and dander: Secondary | ICD-10-CM | POA: Diagnosis not present

## 2019-02-19 DIAGNOSIS — J301 Allergic rhinitis due to pollen: Secondary | ICD-10-CM | POA: Diagnosis not present

## 2019-02-20 ENCOUNTER — Encounter: Payer: Self-pay | Admitting: Family Medicine

## 2019-02-20 ENCOUNTER — Ambulatory Visit: Payer: BLUE CROSS/BLUE SHIELD | Admitting: Family Medicine

## 2019-02-20 VITALS — BP 120/76 | HR 68 | Ht 72.0 in | Wt 164.0 lb

## 2019-02-20 DIAGNOSIS — M40204 Unspecified kyphosis, thoracic region: Secondary | ICD-10-CM

## 2019-02-20 DIAGNOSIS — R42 Dizziness and giddiness: Secondary | ICD-10-CM

## 2019-02-20 DIAGNOSIS — G4452 New daily persistent headache (NDPH): Secondary | ICD-10-CM | POA: Diagnosis not present

## 2019-02-20 DIAGNOSIS — M546 Pain in thoracic spine: Secondary | ICD-10-CM

## 2019-02-20 DIAGNOSIS — G8929 Other chronic pain: Secondary | ICD-10-CM

## 2019-02-20 DIAGNOSIS — M4200 Juvenile osteochondrosis of spine, site unspecified: Secondary | ICD-10-CM | POA: Diagnosis not present

## 2019-02-20 MED ORDER — TIZANIDINE HCL 2 MG PO TABS: 4.0000 mg | ORAL_TABLET | Freq: Three times a day (TID) | ORAL | 1 refills | Status: DC | PRN

## 2019-02-20 MED ORDER — TOPIRAMATE 25 MG PO TABS: 25.0000 mg | ORAL_TABLET | Freq: Two times a day (BID) | ORAL | 3 refills | Status: DC

## 2019-02-20 NOTE — Patient Instructions (Addendum)
Start topiramate 25mg  twice daily, will increase dose based on tolerance.  May take 1-2 tablets of tizanidine every 8 hours (start with 1 tablet to see how you respond)  Continue gabapentin as prescribed  PT referral for vestibular therapy    Topiramate tablets What is this medicine? TOPIRAMATE (toe PYRE a mate) is used to treat seizures in adults or children with epilepsy. It is also used for the prevention of migraine headaches. This medicine may be used for other purposes; ask your health care provider or pharmacist if you have questions. COMMON BRAND NAME(S): Topamax, Topiragen What should I tell my health care provider before I take this medicine? They need to know if you have any of these conditions: -bleeding disorders -cirrhosis of the liver or liver disease -diarrhea -glaucoma -kidney stones or kidney disease -low blood counts, like low white cell, platelet, or red cell counts -lung disease like asthma, obstructive pulmonary disease, emphysema -metabolic acidosis -on a ketogenic diet -schedule for surgery or a procedure -suicidal thoughts, plans, or attempt; a previous suicide attempt by you or a family member -an unusual or allergic reaction to topiramate, other medicines, foods, dyes, or preservatives -pregnant or trying to get pregnant -breast-feeding How should I use this medicine? Take this medicine by mouth with a glass of water. Follow the directions on the prescription label. Do not crush or chew. You may take this medicine with meals. Take your medicine at regular intervals. Do not take it more often than directed. Talk to your pediatrician regarding the use of this medicine in children. Special care may be needed. While this drug may be prescribed for children as young as 49 years of age for selected conditions, precautions do apply. Overdosage: If you think you have taken too much of this medicine contact a poison control center or emergency room at once. NOTE:  This medicine is only for you. Do not share this medicine with others. What if I miss a dose? If you miss a dose, take it as soon as you can. If your next dose is to be taken in less than 6 hours, then do not take the missed dose. Take the next dose at your regular time. Do not take double or extra doses. What may interact with this medicine? Do not take this medicine with any of the following medications: -probenecid This medicine may also interact with the following medications: -acetazolamide -alcohol -amitriptyline -aspirin and aspirin-like medicines -birth control pills -certain medicines for depression -certain medicines for seizures -certain medicines that treat or prevent blood clots like warfarin, enoxaparin, dalteparin, apixaban, dabigatran, and rivaroxaban -digoxin -hydrochlorothiazide -lithium -medicines for pain, sleep, or muscle relaxation -metformin -methazolamide -NSAIDS, medicines for pain and inflammation, like ibuprofen or naproxen -pioglitazone -risperidone This list may not describe all possible interactions. Give your health care provider a list of all the medicines, herbs, non-prescription drugs, or dietary supplements you use. Also tell them if you smoke, drink alcohol, or use illegal drugs. Some items may interact with your medicine. What should I watch for while using this medicine? Visit your doctor or health care professional for regular checks on your progress. Do not stop taking this medicine suddenly. This increases the risk of seizures if you are using this medicine to control epilepsy. Wear a medical identification bracelet or chain to say you have epilepsy or seizures, and carry a card that lists all your medicines. This medicine can decrease sweating and increase your body temperature. Watch for signs of deceased sweating or fever,  especially in children. Avoid extreme heat, hot baths, and saunas. Be careful about exercising, especially in hot weather.  Contact your health care provider right away if you notice a fever or decrease in sweating. You should drink plenty of fluids while taking this medicine. If you have had kidney stones in the past, this will help to reduce your chances of forming kidney stones. If you have stomach pain, with nausea or vomiting and yellowing of your eyes or skin, call your doctor immediately. You may get drowsy, dizzy, or have blurred vision. Do not drive, use machinery, or do anything that needs mental alertness until you know how this medicine affects you. To reduce dizziness, do not sit or stand up quickly, especially if you are an older patient. Alcohol can increase drowsiness and dizziness. Avoid alcoholic drinks. If you notice blurred vision, eye pain, or other eye problems, seek medical attention at once for an eye exam. The use of this medicine may increase the chance of suicidal thoughts or actions. Pay special attention to how you are responding while on this medicine. Any worsening of mood, or thoughts of suicide or dying should be reported to your health care professional right away. This medicine may increase the chance of developing metabolic acidosis. If left untreated, this can cause kidney stones, bone disease, or slowed growth in children. Symptoms include breathing fast, fatigue, loss of appetite, irregular heartbeat, or loss of consciousness. Call your doctor immediately if you experience any of these side effects. Also, tell your doctor about any surgery you plan on having while taking this medicine since this may increase your risk for metabolic acidosis. Birth control pills may not work properly while you are taking this medicine. Talk to your doctor about using an extra method of birth control. Women who become pregnant while using this medicine may enroll in the [place] Pregnancy Registry by calling [phone]. This registry collects information about the safety of  antiepileptic drug use during pregnancy. What side effects may I notice from receiving this medicine? Side effects that you should report to your doctor or health care professional as soon as possible: -allergic reactions like skin rash, itching or hives, swelling of the face, lips, or tongue -decreased sweating and/or rise in body temperature -depression -difficulty breathing, fast or irregular breathing patterns -difficulty speaking -difficulty walking or controlling muscle movements -hearing impairment -redness, blistering, peeling or loosening of the skin, including inside the mouth -tingling, pain or numbness in the hands or feet -unusual bleeding or bruising -unusually weak or tired -worsening of mood, thoughts or actions of suicide or dying Side effects that usually do not require medical attention (report to your doctor or health care professional if they continue or are bothersome): -altered taste -back pain, joint or muscle aches and pains -diarrhea, or constipation -headache -loss of appetite -nausea -stomach upset, indigestion -tremors This list may not describe all possible side effects. Call your doctor for medical advice about side effects. You may report side effects to FDA at 1-800-FDA-1088. Where should I keep my medicine? Keep out of the reach of children. Store at room temperature between 15 and 30 degrees C (59 and 86 degrees F) in a tightly closed container. Protect from moisture. Throw away any unused medicine after the expiration date. NOTE: This sheet is a summary. It may not cover all possible information. If you have questions about this medicine, talk to your doctor, pharmacist, or health care provider.  2019 Elsevier/Gold Standard (2013-12-03 23:17:57)

## 2019-02-20 NOTE — Progress Notes (Signed)
PATIENT: Bernard Dalton DOB: 25-Jan-1981  REASON FOR VISIT: follow up HISTORY FROM: patient  Chief Complaint  Patient presents with  . Follow-up    Headache follow up. Alone. Rm 9. Patient mentioned when he has a headache he has some nausea and facial pain. He stated that nothing really seems to help.      HISTORY OF PRESENT ILLNESS: Today 02/21/19 Bernard Dalton is a 38 y.o. male here today for follow up for daily headaches. At his last visit with Dr Bernard Dalton gabapentin was increased to 300/300/600. He did feel that this helped his headaches. He continues to have regular headaches. Headaches are positional. With looking up or down he feels an electric sensation in the frontal /retroorbital regions of his head. He gets nausea and vertiginous symptoms with headache. This occurs suddenly with looking upward or downward. He feels unbalanced. He endorses significant neck pain and stiffness. Symptoms started two weeks after placing fence posts at home. He is seeing Dr Bernard Dalton, neurosurgery, and has multiple imaging. 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 There is cervical foraminal narrowing and unconvertebral disease as well. They are doing epidural injection and medial branch blocks, as well as RFA. He had rhizotomy of C5-C7 in January that he feels may have made headaches worse. He has tried dry needling that helps temporarily. He is going to PT twice weekly. He has cervical traction at home that provides minimal relief. He has taken medications listed below with minimal relief.   Medications tried in the past: imipramine, Lamictal, Paxil, Maxalt, Flexeril    HISTORY: (copied from Dr Bernard Dalton note on 09/28/2018) Bernard Dalton is a 38 y.o. man with headaches and papilledema.  Update 09/28/2018: He reports headaches are about the same.  Pain is worse with looking down.   He was felt to have possible papilledema initially but follow up did not show any.   In the interim, he had  a normal LP.     He did PT but never had any lasting benefit.   He was referred to Dr. Patrice Dalton / spine surgeon.    He had cervical spine ESI and felt he got 20-30% improvement. However after the injection, he has had nausea.    He goes back to see Dr. Patrice Dalton in a couple weeks.      He is also having more thoracic pain.  He has kyphosis in the upper thoracic spine.  He is on gabapentin.   He wasn't sure it was helping.    Imipramine had not helped.    Flexeril helped him sleep but did not help daytime   I personally reviewed the cervical spine MRI from 08/21/2018.   It shows C5C6 > C4C5/C6C7 DJD/DDD but only mild foraminal narrowing.   No spinal stenosis or nerve root compression    Update 05/25/2018: I saw Bernard Dalton about 6 weeks ago for his severe headaches.  Although he had papilledema on the OCT test done with ophthalmology, I did not see any evidence of papilledema during my exam.   We did a LP and the OP was 185 mm.    I added imipramine and he noted no improvement after a few weeks so he stopped.     However, over time the headaches are improving.  Symptoms seem worse with sitting.   He saw a spine doctor who told him his spine looked ok.    He started PT and Flexeril.    He felt the Flexeril  helped some.   Dry needling has helped some but furhter improvement has stopped.      He is back to work and more functional.   Now, most days, he has no headache in the morning.  He gets headaches, now milder, as the day goes on.    HA is worse with sitting so he uses a standing desk and rarely sits except to drive.     From 04/13/2018:  For the past 2 months, he has had daily headaches.  Pain is behind his eyes, left = right.  When pain is worsening, he gets nauseous.   Changing gaze does not affect the pain.    The headaches are mild in the morning and worsen as the day goes on.    Also worsens with looking at a computer or other screen.     Pain is worse when sitting a longer time and better with standing.     When sitting a while he feels that the vision worsens. Moving his head does not change intensity.   When more severe he gets nausea but no photophobia or phonophobia.   He has mild neck pain at times. He notes no change in color vision.   He denies any diplopia.  Sometimes he notes minimal tinnitus.  He does not have blackening of his vision or presyncope upon standing up.  Initially, migraines were suspected and rizatriptan was tried without.    Notes from Dr. Ernestine Dalton were reviewed.    Because the headaches would be associated with visual changes were more intense, he saw his ophthalmologist.  I reviewed the notes.  Visual acuity was 20/20 with glasses.  External and internal ocular exam was normal.  Specifically, the optic nerve appeared to be normal.  However, OCT showed optic nerve bulging worrisome for papilledema.  Formal visual field testing was not done.     He also saw an allergist, Dr. Fredderick Dalton, and was told that the headaches were unlikely to be related to his seasonal allergies.    Due to continuing symptoms and the possibility of papilledema, he is referred for further evaluation.  I personally reviewed the MRI images of the brain with and without contrast and reviewed the report from 04/08/2018.   The brain is essentially normal.  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    I did not note any changes in the pituitary gland of the sella turcica.  Additionally the optic nerve sheaths had normal diameter.   Many patients with elevated intracranial pressure will have a flattened pituitary gland, enlarged sella turcica and/or widened optic nerve sheaths.  He denies numbness, weakness or clumsiness.   He has mild neck and back pain and was told he has kyphosis.   However, no change in intensity recently.  He has no cognitive change in general but last month had one episode where he felt he was  a little confused.   He has ADD helped by Adderall  He had urinary urgency a few years ago and sore urology.  He was diagnosed with a mildly enlarged prostate and interstitial cystitis.  He takes Elmiron.   He gets more anxious when urgency is present and takes Xanax if symptoms are worse.    REVIEW OF SYSTEMS: Out of a complete 14 system review of symptoms, the patient complains only of the following symptoms, nausea, achy muscles, muscle cramps,  neck pain, dizziness headaches and all other reviewed systems are negative.  ALLERGIES: Allergies  Allergen Reactions  . Prednisone Other (See Comments)    Severe anxiety, cognitive impairment, "hallucinations"---"I never want to take that med again"    HOME MEDICATIONS: Outpatient Medications Prior to Visit  Medication Sig Dispense Refill  . ADDERALL XR 10 MG 24 hr capsule Take 10 mg by mouth daily.  0  . ALPRAZolam (XANAX) 0.25 MG tablet Take 1 tablet by mouth daily as needed. Reported on 02/20/2016  2  . fexofenadine (ALLEGRA) 180 MG tablet Take 180 mg by mouth daily.    . fluticasone (FLONASE) 50 MCG/ACT nasal spray Place into both nostrils daily.    Marland Kitchen gabapentin (NEURONTIN) 300 MG capsule Take one po qAM, one po qPM and teo po qHS 120 capsule 11   No facility-administered medications prior to visit.     PAST MEDICAL HISTORY: Past Medical History:  Diagnosis Date  . ADD (attention deficit disorder)   . Allergic rhinitis    + allerg conjunctivitis.  Immunotherapy started 2019  . Anxiety and depression   . Chronic prostatitis    chronic irritative voiding symptoms: pelvic PT via [facility] helping as of 07/2016  . DDD (degenerative disc disease), cervical 08/2018   (Dr. Maia Petties):  C5-C6 --ESI 25% improvement in fall 2019.  Pt desiring C spine surgery to see if this alleviates his symptom complex..  . Diverticulosis    with 'itis per pt report; however, review of old record CT reports shows mild SB enteritis with mesenteric  adenopathy 06/2015; then 11/2014 CT abd/pelv done for urinary frequency and hematuria showed NORMAL abd/pelv  . Elevation of optic disc, bilateral 2019   Normal variant per ophtho--Dr. Larose Kells.  Marland Kitchen Headache syndrome 02/2018   Chronic intractable headaches of unknown etiology (occipital): 03/2018 MRI normal: ? papilledema on optho exam.  Dr. Felecia Dalton with neuro saw him 04/2018, no papilledema noted and opening CSF pressure was normal.  CSF labs normal.  ESR and ANA normal.  No clear reason for HA's found.  Imiprimine trial no help.  Trigger point injections in occipital mm's 05/2018 by Dr. Felecia Dalton.--no help.   Marland Kitchen Headache syndrome 02/2018   As of 07/2018, pt has plan to see Dr. Patrice Dalton, NS/spine specialist as next step (his HAs are likely cervicogenic).  Dr. Maia Petties did C7-T1 ESI on 09/11/18--25% improvement.   . IBS (irritable bowel syndrome)    diarr predom as per old PCP records  . Nephrolithiasis    Nonobstructive, noted on CT (1-2 mm)-has never passed a stone that he knows of.  . 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 Spondylosis without myelopathy or radiculopathy, thoracic region 2019  . Vision abnormalities     PAST SURGICAL HISTORY: Past Surgical History:  Procedure Laterality Date  . LASIK Bilateral 2011  . REFRACTIVE SURGERY Bilateral     FAMILY HISTORY: Family History  Problem Relation Age of Onset  . Alcohol abuse Mother   . Alcohol abuse Father   . Kidney disease Father   . Alcohol abuse Maternal Grandmother   . Alcohol abuse Maternal Grandfather   . Stroke Paternal Grandmother   . Congestive Heart Failure Paternal Grandmother   . Alcohol abuse Paternal Grandmother   . Alcohol abuse Paternal Grandfather   . ADD / ADHD Brother   . ADD / ADHD Brother     SOCIAL HISTORY: Social History   Socioeconomic History  . Marital status: Married  Spouse name: Not on file  . Number of children: Not on file  . Years of education: Not on file  . Highest education  level: Not on file  Occupational History  . Not on file  Social Needs  . Financial resource strain: Not on file  . Food insecurity:    Worry: Not on file    Inability: Not on file  . Transportation needs:    Medical: Not on file    Non-medical: Not on file  Tobacco Use  . Smoking status: Never Smoker  . Smokeless tobacco: Never Used  Substance and Sexual Activity  . Alcohol use: No  . Drug use: No  . Sexual activity: Not on file  Lifestyle  . Physical activity:    Days per week: Not on file    Minutes per session: Not on file  . Stress: Not on file  Relationships  . Social connections:    Talks on phone: Not on file    Gets together: Not on file    Attends religious service: Not on file    Active member of club or organization: Not on file    Attends meetings of clubs or organizations: Not on file    Relationship status: Not on file  . Intimate partner violence:    Fear of current or ex partner: Not on file    Emotionally abused: Not on file    Physically abused: Not on file    Forced sexual activity: Not on file  Other Topics Concern  . Not on file  Social History Narrative   Married, 1 child and 1 on the way.   Relocated from [facility] to [facility] 08/2015.   Educ: college at [employer].   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   No T/A/Ds.      PHYSICAL EXAM  Vitals:   02/20/19 1509  BP: 120/76  Pulse: 68  Weight: 164 lb (74.4 kg)  Height: 6' (1.829 m)   Body mass index is 22.24 kg/m.  Generalized: Well developed, in no acute distress  Cardiology: normal rate and rhythm, no murmur noted Neurological examination  Mentation: Alert oriented to time, place, history taking. Follows all commands speech and language fluent Cranial nerve II-XII: Pupils were equal round reactive to light. Extraocular movements were full, visual field were full on confrontational test. Facial sensation and strength were normal. Uvula tongue midline. Head turning and shoulder  shrug  were normal and symmetric. Motor: The motor testing reveals 5 over 5 strength of all 4 extremities. Good symmetric motor tone is noted throughout.  Sensory: Sensory testing is intact to soft touch on all 4 extremities. No evidence of extinction is noted.  Coordination: Cerebellar testing reveals good finger-nose-finger and heel-to-shin bilaterally.  Gait and station: Gait is normal.  Reflexes: Deep tendon reflexes brisk but symmetric bilaterally, suprapatellar reflexes +3  Kyphosis of thoracic spine   DIAGNOSTIC DATA (LABS, IMAGING, TESTING) - I reviewed patient records, labs, notes, testing and imaging myself where available.  No flowsheet data found.   Lab Results  Component Value Date   WBC 4.3 07/29/2016   HGB 15.0 07/29/2016   HCT 44.3 07/29/2016   MCV 87.5 07/29/2016   PLT 178 07/29/2016      Component Value Date/Time   NA 142 07/29/2016 0803   K 4.6 07/29/2016 0803   CL 106 07/29/2016 0803   CO2 25 07/29/2016 0803   GLUCOSE 96 07/29/2016 0803   BUN 10 07/29/2016 0803  CREATININE 1.04 07/29/2016 0803   CALCIUM 9.7 07/29/2016 0803   PROT 6.3 07/29/2016 0803   ALBUMIN 4.3 07/29/2016 0803   AST 10 07/29/2016 0803   ALT 10 07/29/2016 0803   ALKPHOS 47 07/29/2016 0803   BILITOT 1.1 07/29/2016 0803   No results found for: CHOL, HDL, LDLCALC, LDLDIRECT, TRIG, CHOLHDL Lab Results  Component Value Date   HGBA1C 4.7 07/29/2016   No results found for: VITAMINB12 Lab Results  Component Value Date   TSH 1.31 07/29/2016     ASSESSMENT AND PLAN 38 y.o. year old male  has a past medical history of ADD (attention deficit disorder), Allergic rhinitis, Anxiety and depression, Chronic prostatitis, DDD (degenerative disc disease), cervical (08/2018), Diverticulosis, Elevation of optic disc, bilateral (2019), Headache syndrome (02/2018), Headache syndrome (02/2018), IBS (irritable bowel syndrome), Nephrolithiasis, Scheuermann's kyphosis, Spondylosis without myelopathy or  radiculopathy, thoracic region (2019), and Vision abnormalities. here with     ICD-10-CM   1. Scheuermann's kyphosis M42.00   2. Kyphosis of thoracic region, unspecified kyphosis type M40.204 tiZANidine (ZANAFLEX) 2 MG tablet  3. 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 New persistent daily headache G44.52 topiramate (TOPAMAX) 25 MG tablet  5. Vertigo R42 Ambulatory referral to Physical Therapy    He continues to have headaches, specifically with position changes of neck (looking upward, downward or looking to the sides too quickly). I am concerned that headaches are related to significant kyphosis and cervical changes. We will add topiramate for headaches. I will also try tizanidine for neck pain and tightness. We will ask PT to perform vestibular therapy to see if this aids in vertigo symptoms. I have advised close follow up with neurosurgery for guidance regarding when surgical procedure is warranted. He will follow up with me in 3 months.    Orders Placed This Encounter  Procedures  . Ambulatory referral to Physical Therapy    Referral Priority:   Routine    Referral Type:   Physical Medicine    Referral Reason:   Specialty Services Required    Requested Specialty:   Physical Therapy    Number of Visits Requested:   1     Meds ordered this encounter  Medications  . topiramate (TOPAMAX) 25 MG tablet    Sig: Take 1 tablet (25 mg total) by mouth 2 (two) times daily.    Dispense:  120 tablet    Refill:  3    Order Specific Question:   Supervising Provider    Answer:   Melvenia Beam [id number]  . tiZANidine (ZANAFLEX) 2 MG tablet    Sig: Take 2 tablets (4 mg total) by mouth every 8 (eight) hours as needed for muscle spasms.    Dispense:  60 tablet    Refill:  1    Order Specific Question:   Supervising Provider    Answer:   Bess Harvest, FNP-C 02/21/2019, 10:24 AM [facility] [address], [place], [place] [address] [phone]

## 2019-02-21 ENCOUNTER — Encounter: Payer: Self-pay | Admitting: Family Medicine

## 2019-02-21 ENCOUNTER — Telehealth: Payer: Self-pay | Admitting: Family Medicine

## 2019-02-21 DIAGNOSIS — M4200 Juvenile osteochondrosis of spine, site unspecified: Secondary | ICD-10-CM | POA: Insufficient documentation

## 2019-02-21 NOTE — Progress Notes (Signed)
I have read the note, and I agree with the clinical assessment and plan.  Richard A. Sater, MD, PhD, FAAN Certified in Neurology, Clinical Neurophysiology, Sleep Medicine, Pain Medicine and Neuroimaging  [facility] [address], [address] [place], [place] [address] [phone]  

## 2019-02-22 DIAGNOSIS — F419 Anxiety disorder, unspecified: Secondary | ICD-10-CM | POA: Diagnosis not present

## 2019-02-22 NOTE — Telephone Encounter (Signed)
Error

## 2019-02-23 DIAGNOSIS — M542 Cervicalgia: Secondary | ICD-10-CM | POA: Diagnosis not present

## 2019-02-26 DIAGNOSIS — J3089 Other allergic rhinitis: Secondary | ICD-10-CM | POA: Diagnosis not present

## 2019-02-26 DIAGNOSIS — J301 Allergic rhinitis due to pollen: Secondary | ICD-10-CM | POA: Diagnosis not present

## 2019-02-26 DIAGNOSIS — J3081 Allergic rhinitis due to animal (cat) (dog) hair and dander: Secondary | ICD-10-CM | POA: Diagnosis not present

## 2019-02-26 DIAGNOSIS — M542 Cervicalgia: Secondary | ICD-10-CM | POA: Diagnosis not present

## 2019-03-02 DIAGNOSIS — R42 Dizziness and giddiness: Secondary | ICD-10-CM | POA: Diagnosis not present

## 2019-03-02 DIAGNOSIS — H81399 Other peripheral vertigo, unspecified ear: Secondary | ICD-10-CM | POA: Diagnosis not present

## 2019-03-05 DIAGNOSIS — F419 Anxiety disorder, unspecified: Secondary | ICD-10-CM | POA: Diagnosis not present

## 2019-03-05 DIAGNOSIS — J3089 Other allergic rhinitis: Secondary | ICD-10-CM | POA: Diagnosis not present

## 2019-03-05 DIAGNOSIS — J3081 Allergic rhinitis due to animal (cat) (dog) hair and dander: Secondary | ICD-10-CM | POA: Diagnosis not present

## 2019-03-05 DIAGNOSIS — M542 Cervicalgia: Secondary | ICD-10-CM | POA: Diagnosis not present

## 2019-03-05 DIAGNOSIS — J301 Allergic rhinitis due to pollen: Secondary | ICD-10-CM | POA: Diagnosis not present

## 2019-03-08 DIAGNOSIS — M545 Low back pain: Secondary | ICD-10-CM | POA: Diagnosis not present

## 2019-03-08 DIAGNOSIS — R51 Headache: Secondary | ICD-10-CM | POA: Diagnosis not present

## 2019-03-08 DIAGNOSIS — M40205 Unspecified kyphosis, thoracolumbar region: Secondary | ICD-10-CM | POA: Diagnosis not present

## 2019-03-09 DIAGNOSIS — M542 Cervicalgia: Secondary | ICD-10-CM | POA: Diagnosis not present

## 2019-03-12 DIAGNOSIS — J3089 Other allergic rhinitis: Secondary | ICD-10-CM | POA: Diagnosis not present

## 2019-03-12 DIAGNOSIS — J301 Allergic rhinitis due to pollen: Secondary | ICD-10-CM | POA: Diagnosis not present

## 2019-03-12 DIAGNOSIS — J3081 Allergic rhinitis due to animal (cat) (dog) hair and dander: Secondary | ICD-10-CM | POA: Diagnosis not present

## 2019-03-16 DIAGNOSIS — M542 Cervicalgia: Secondary | ICD-10-CM | POA: Diagnosis not present

## 2019-03-18 ENCOUNTER — Encounter: Payer: Self-pay | Admitting: Family Medicine

## 2019-03-19 DIAGNOSIS — J3081 Allergic rhinitis due to animal (cat) (dog) hair and dander: Secondary | ICD-10-CM | POA: Diagnosis not present

## 2019-03-19 DIAGNOSIS — J3089 Other allergic rhinitis: Secondary | ICD-10-CM | POA: Diagnosis not present

## 2019-03-19 DIAGNOSIS — J301 Allergic rhinitis due to pollen: Secondary | ICD-10-CM | POA: Diagnosis not present

## 2019-03-22 DIAGNOSIS — F419 Anxiety disorder, unspecified: Secondary | ICD-10-CM | POA: Diagnosis not present

## 2019-03-23 ENCOUNTER — Encounter: Payer: Self-pay | Admitting: Family Medicine

## 2019-03-23 DIAGNOSIS — M542 Cervicalgia: Secondary | ICD-10-CM | POA: Diagnosis not present

## 2019-03-26 DIAGNOSIS — M542 Cervicalgia: Secondary | ICD-10-CM | POA: Diagnosis not present

## 2019-03-27 DIAGNOSIS — J3089 Other allergic rhinitis: Secondary | ICD-10-CM | POA: Diagnosis not present

## 2019-03-27 DIAGNOSIS — J301 Allergic rhinitis due to pollen: Secondary | ICD-10-CM | POA: Diagnosis not present

## 2019-03-27 DIAGNOSIS — J3081 Allergic rhinitis due to animal (cat) (dog) hair and dander: Secondary | ICD-10-CM | POA: Diagnosis not present

## 2019-03-30 DIAGNOSIS — M542 Cervicalgia: Secondary | ICD-10-CM | POA: Diagnosis not present

## 2019-04-02 DIAGNOSIS — J3081 Allergic rhinitis due to animal (cat) (dog) hair and dander: Secondary | ICD-10-CM | POA: Diagnosis not present

## 2019-04-02 DIAGNOSIS — J3089 Other allergic rhinitis: Secondary | ICD-10-CM | POA: Diagnosis not present

## 2019-04-02 DIAGNOSIS — M542 Cervicalgia: Secondary | ICD-10-CM | POA: Diagnosis not present

## 2019-04-02 DIAGNOSIS — J301 Allergic rhinitis due to pollen: Secondary | ICD-10-CM | POA: Diagnosis not present

## 2019-04-05 DIAGNOSIS — F419 Anxiety disorder, unspecified: Secondary | ICD-10-CM | POA: Diagnosis not present

## 2019-04-06 DIAGNOSIS — R42 Dizziness and giddiness: Secondary | ICD-10-CM | POA: Diagnosis not present

## 2019-04-06 DIAGNOSIS — H81399 Other peripheral vertigo, unspecified ear: Secondary | ICD-10-CM | POA: Diagnosis not present

## 2019-04-11 DIAGNOSIS — J3089 Other allergic rhinitis: Secondary | ICD-10-CM | POA: Diagnosis not present

## 2019-04-11 DIAGNOSIS — J3081 Allergic rhinitis due to animal (cat) (dog) hair and dander: Secondary | ICD-10-CM | POA: Diagnosis not present

## 2019-04-11 DIAGNOSIS — J301 Allergic rhinitis due to pollen: Secondary | ICD-10-CM | POA: Diagnosis not present

## 2019-04-13 DIAGNOSIS — M542 Cervicalgia: Secondary | ICD-10-CM | POA: Diagnosis not present

## 2019-04-19 DIAGNOSIS — F900 Attention-deficit hyperactivity disorder, predominantly inattentive type: Secondary | ICD-10-CM | POA: Diagnosis not present

## 2019-04-19 DIAGNOSIS — J3089 Other allergic rhinitis: Secondary | ICD-10-CM | POA: Diagnosis not present

## 2019-04-19 DIAGNOSIS — J3081 Allergic rhinitis due to animal (cat) (dog) hair and dander: Secondary | ICD-10-CM | POA: Diagnosis not present

## 2019-04-19 DIAGNOSIS — J301 Allergic rhinitis due to pollen: Secondary | ICD-10-CM | POA: Diagnosis not present

## 2019-04-19 DIAGNOSIS — F419 Anxiety disorder, unspecified: Secondary | ICD-10-CM | POA: Diagnosis not present

## 2019-04-19 DIAGNOSIS — F411 Generalized anxiety disorder: Secondary | ICD-10-CM | POA: Diagnosis not present

## 2019-04-19 DIAGNOSIS — F329 Major depressive disorder, single episode, unspecified: Secondary | ICD-10-CM | POA: Diagnosis not present

## 2019-04-23 DIAGNOSIS — J301 Allergic rhinitis due to pollen: Secondary | ICD-10-CM | POA: Diagnosis not present

## 2019-04-23 DIAGNOSIS — M542 Cervicalgia: Secondary | ICD-10-CM | POA: Diagnosis not present

## 2019-04-23 DIAGNOSIS — J3089 Other allergic rhinitis: Secondary | ICD-10-CM | POA: Diagnosis not present

## 2019-04-23 DIAGNOSIS — J3081 Allergic rhinitis due to animal (cat) (dog) hair and dander: Secondary | ICD-10-CM | POA: Diagnosis not present

## 2019-04-30 DIAGNOSIS — M542 Cervicalgia: Secondary | ICD-10-CM | POA: Diagnosis not present

## 2019-04-30 DIAGNOSIS — J301 Allergic rhinitis due to pollen: Secondary | ICD-10-CM | POA: Diagnosis not present

## 2019-04-30 DIAGNOSIS — J3081 Allergic rhinitis due to animal (cat) (dog) hair and dander: Secondary | ICD-10-CM | POA: Diagnosis not present

## 2019-04-30 DIAGNOSIS — J3089 Other allergic rhinitis: Secondary | ICD-10-CM | POA: Diagnosis not present

## 2019-05-03 DIAGNOSIS — F419 Anxiety disorder, unspecified: Secondary | ICD-10-CM | POA: Diagnosis not present

## 2019-05-04 DIAGNOSIS — M542 Cervicalgia: Secondary | ICD-10-CM | POA: Diagnosis not present

## 2019-05-08 DIAGNOSIS — J3081 Allergic rhinitis due to animal (cat) (dog) hair and dander: Secondary | ICD-10-CM | POA: Diagnosis not present

## 2019-05-08 DIAGNOSIS — J301 Allergic rhinitis due to pollen: Secondary | ICD-10-CM | POA: Diagnosis not present

## 2019-05-08 DIAGNOSIS — J3089 Other allergic rhinitis: Secondary | ICD-10-CM | POA: Diagnosis not present

## 2019-05-11 DIAGNOSIS — M542 Cervicalgia: Secondary | ICD-10-CM | POA: Diagnosis not present

## 2019-05-14 DIAGNOSIS — E538 Deficiency of other specified B group vitamins: Secondary | ICD-10-CM

## 2019-05-14 DIAGNOSIS — J3081 Allergic rhinitis due to animal (cat) (dog) hair and dander: Secondary | ICD-10-CM | POA: Diagnosis not present

## 2019-05-14 DIAGNOSIS — J3089 Other allergic rhinitis: Secondary | ICD-10-CM | POA: Diagnosis not present

## 2019-05-14 DIAGNOSIS — J301 Allergic rhinitis due to pollen: Secondary | ICD-10-CM | POA: Diagnosis not present

## 2019-05-14 HISTORY — DX: Deficiency of other specified B group vitamins: E53.8

## 2019-05-17 DIAGNOSIS — F419 Anxiety disorder, unspecified: Secondary | ICD-10-CM | POA: Diagnosis not present

## 2019-05-18 DIAGNOSIS — M542 Cervicalgia: Secondary | ICD-10-CM | POA: Diagnosis not present

## 2019-05-21 DIAGNOSIS — J3081 Allergic rhinitis due to animal (cat) (dog) hair and dander: Secondary | ICD-10-CM | POA: Diagnosis not present

## 2019-05-21 DIAGNOSIS — J301 Allergic rhinitis due to pollen: Secondary | ICD-10-CM | POA: Diagnosis not present

## 2019-05-21 DIAGNOSIS — J3089 Other allergic rhinitis: Secondary | ICD-10-CM | POA: Diagnosis not present

## 2019-05-23 ENCOUNTER — Encounter: Payer: Self-pay | Admitting: Family Medicine

## 2019-05-23 ENCOUNTER — Ambulatory Visit (INDEPENDENT_AMBULATORY_CARE_PROVIDER_SITE_OTHER): Payer: BC Managed Care – PPO | Admitting: Family Medicine

## 2019-05-23 ENCOUNTER — Other Ambulatory Visit: Payer: Self-pay

## 2019-05-23 VITALS — BP 104/67 | HR 76 | Temp 97.2°F | Resp 17 | Ht 72.0 in | Wt 163.4 lb

## 2019-05-23 DIAGNOSIS — M62838 Other muscle spasm: Secondary | ICD-10-CM

## 2019-05-23 DIAGNOSIS — R202 Paresthesia of skin: Secondary | ICD-10-CM

## 2019-05-23 DIAGNOSIS — M791 Myalgia, unspecified site: Secondary | ICD-10-CM | POA: Diagnosis not present

## 2019-05-23 DIAGNOSIS — M40204 Unspecified kyphosis, thoracic region: Secondary | ICD-10-CM

## 2019-05-23 DIAGNOSIS — K219 Gastro-esophageal reflux disease without esophagitis: Secondary | ICD-10-CM | POA: Diagnosis not present

## 2019-05-23 LAB — BASIC METABOLIC PANEL
BUN: 8 mg/dL (ref 6–23)
CO2: 30 mEq/L (ref 19–32)
Calcium: 9.8 mg/dL (ref 8.4–10.5)
Chloride: 104 mEq/L (ref 96–112)
Creatinine, Ser: 0.91 mg/dL (ref 0.40–1.50)
GFR: 93.21 mL/min (ref >60.00)
Glucose, Bld: 88 mg/dL (ref 70–99)
Potassium: 4 mEq/L (ref 3.5–5.1)
Sodium: 140 mEq/L (ref 135–145)

## 2019-05-23 LAB — MAGNESIUM: Magnesium: 2 mg/dL (ref 1.5–2.5)

## 2019-05-23 LAB — VITAMIN B12: Vitamin B-12: 218 pg/mL (ref 211–911)

## 2019-05-23 LAB — VITAMIN D 25 HYDROXY (VIT D DEFICIENCY, FRACTURES): VITD: 31.55 ng/mL (ref 30.00–100.00)

## 2019-05-23 MED ORDER — DICLOFENAC SODIUM ER 100 MG PO TB24: 100.0000 mg | ORAL_TABLET | Freq: Every day | ORAL | 2 refills | Status: DC

## 2019-05-23 MED ORDER — OMEPRAZOLE 40 MG PO CPDR: 40.0000 mg | DELAYED_RELEASE_CAPSULE | Freq: Every day | ORAL | 1 refills | Status: DC

## 2019-05-23 NOTE — Progress Notes (Signed)
Bernard Dalton , May 09, 1981, 38 y.o., male MRN: [id number] Patient Care Team    Relationship Specialty Notifications Start End  McGowen, Adrian Blackwater, MD PCP - General Family Medicine  09/01/15   Carolan Clines, MD (Inactive) Consulting Physician Urology  07/28/16   Doroteo Glassman, MD Consulting Physician Psychology  07/28/16   Britt Bottom, MD Consulting Physician Neurology  04/13/18   Zonia Kief, MD Consulting Physician Rehabilitation  09/10/18   Leonia Corona, MD Consulting Physician Ophthalmology  09/10/18   Harold Hedge, Darrick Grinder, MD Consulting Physician Allergy and Immunology  11/13/18   Britt Bottom, MD Consulting Physician Neurology  03/18/19     Chief Complaint  Patient presents with  . Heartburn    Pt has been taking a lot of NSAIDS for back pain, pt has upcoming surgery. He has had heartburn off and on for the last 3 weeks but this past week it has been worse.      Subjective: Bernard Dalton is a 38 y.o. male Pt presents for an OV with multiple complaints. Reports he has had approximately 2 to 3 weeks of increasing heartburn.  He has been needing to use increased amounts of NSAIDs for his kyphosis.  He is been working with physical therapist for his back condition and they told him to discuss with his primary care provider is persistent back spasms and paresthesias of his  arm.  Patient reports he is tried NSAIDs, physical therapy, muscle relaxers and his pain has remained quite consistent with muscle spasming and intermittent numbness and tingling of his arm.  No flowsheet data found.  Allergies  Allergen Reactions  . Prednisone Other (See Comments)    Severe anxiety, cognitive impairment, "hallucinations"---"I never want to take that med again"   Social History   Social History Narrative   Married, 1 child and 1 on the way.   Relocated from [facility] to [facility] 08/2015.   Educ: college at [employer].   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   No T/A/Ds.   Past  Medical History:  Diagnosis Date  . ADD (attention deficit disorder)   . Allergic rhinitis    + allerg conjunctivitis.  Immunotherapy started 2019  . Anxiety and depression   . Chronic prostatitis    chronic irritative voiding symptoms: pelvic PT via [facility] helping as of 07/2016  . DDD (degenerative disc disease), cervical 08/2018   (Dr. Maia Petties):  C5-C6 --ESI 25% improvement in fall 2019.  Pt desiring C spine surgery to see if this alleviates his symptom complex..  . Diverticulosis    with 'itis per pt report; however, review of old record CT reports shows mild SB enteritis with mesenteric adenopathy 06/2015; then 11/2014 CT abd/pelv done for urinary frequency and hematuria showed NORMAL abd/pelv  . Elevation of optic disc, bilateral 2019   Normal variant per ophtho--Dr. Larose Kells.  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 papilledema on optho exam.  Dr. [name] with neuro saw him 04/2018, no papilledema noted and opening CSF pressure was nl.  CSF labs nl.  ESR and ANA normal.  No clear reason for HA's found.  Imipr trial no help.  Trig pt inj's in occipi mm's 05/2018 by Dr. [name]. no help.Topamax trial 02/2019.  Marland Kitchen Headache syndrome 02/2018   As of 07/2018, pt has plan to see Dr. [name], NS/spine specialist as next step (his HAs are likely cervicogenic).  Dr.  Saullo did C7-T1 ESI on 09/11/18--25% improvement.   . IBS (irritable bowel syndrome)    diarr predom as per old PCP records  . Nephrolithiasis    Nonobstructive, noted on CT (1-2 mm)-has never passed a stone that he knows of.  . 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 Spondylosis without myelopathy or radiculopathy, thoracic region 2019  . Vision abnormalities    Past Surgical History:  Procedure Laterality Date  . LASIK Bilateral 2011  . REFRACTIVE SURGERY Bilateral    Family History  Problem Relation Age of Onset  .  Alcohol abuse Mother   . Alcohol abuse Father   . Kidney disease Father   . Alcohol abuse Maternal Grandmother   . Alcohol abuse Maternal Grandfather   . Stroke Paternal Grandmother   . Congestive Heart Failure Paternal Grandmother   . Alcohol abuse Paternal Grandmother   . Alcohol abuse Paternal Grandfather   . ADD / ADHD Brother   . ADD / ADHD Brother    Allergies as of 05/23/2019      Reactions   Prednisone Other (See Comments)   Severe anxiety, cognitive impairment, "hallucinations"---"I never want to take that med again"      Medication List       Accurate as of May 23, 2019 11:59 PM. If you have any questions, ask your nurse or doctor.        acetaminophen 500 MG tablet Commonly known as: TYLENOL Take 500 mg by mouth every 6 (six) hours as needed.   Adderall XR 10 MG 24 hr capsule Generic drug: amphetamine-dextroamphetamine Take 10 mg by mouth daily.   ALPRAZolam 0.25 MG tablet Commonly known as: XANAX Take 1 tablet by mouth daily as needed. Reported on 02/20/2016   Diclofenac Sodium CR 100 MG 24 hr tablet Take 1 tablet (100 mg total) by mouth daily. Started by: Howard Pouch, DO   fexofenadine 180 MG tablet Commonly known as: ALLEGRA Take 180 mg by mouth daily.   fluticasone 50 MCG/ACT nasal spray Commonly known as: FLONASE Place into both nostrils daily.   gabapentin 300 MG capsule Commonly known as: NEURONTIN Take one po qAM, one po qPM and teo po qHS   omeprazole 40 MG capsule Commonly known as: PRILOSEC Take 1 capsule (40 mg total) by mouth daily. Started by: Howard Pouch, DO   tiZANidine 2 MG tablet Commonly known as: ZANAFLEX Take 2 tablets (4 mg total) by mouth every 8 (eight) hours as needed for muscle spasms.   topiramate 25 MG tablet Commonly known as: TOPAMAX Take 1 tablet (25 mg total) by mouth 2 (two) times daily.       All past medical history, surgical history, allergies, family history, immunizations andmedications were updated  in the EMR today and reviewed under the history and medication portions of their EMR.     ROS: Negative, with the exception of above mentioned in HPI   Objective:  BP 104/67 (BP Location: Left Arm, Patient Position: Sitting, Cuff Size: Normal)   Pulse 76   Temp (!) 97.2 F (36.2 C) (Oral)   Resp 17   Ht 6' (1.829 m)   Wt 163 lb 6 oz (74.1 kg)   SpO2 99%   BMI 22.16 kg/m  Body mass index is 22.16 kg/m. Gen: Afebrile. No acute distress. Nontoxic in appearance, well developed, well nourished.  HENT: AT. [place].  MMM, no oral lesions. Throat without erythema or exudates. Eyes:Pupils Equal Round Reactive to light, Extraocular movements  intact,  Conjunctiva without redness, discharge or icterus. Neck/lymp/endocrine: Supple, no lymphadenopathy CV: RRR no murmur, no edema Chest: CTAB, no wheeze or crackles. Good air movement, normal resp effort.  Skin: No rashes, purpura or petechiae.  Neuro:  Normal gait. PERLA. EOMi. Alert. Oriented x3  No exam data present No results found. No results found for this or any previous visit (from the past 24 hour(s)).  Assessment/Plan: Zyen Triggs is a 38 y.o. male present for OV for  Paresthesia/Myalgia/Muscle spasm/Kyphosis of thoracic region, unspecified kyphosis type Inquiring if there is any possible vitamin deficiencies that could be causing his muscle spasms.  Discussed checking electrolytes, vitamin D, B12 and magnesium with him today and he would like to have these completed. - B12 - Vitamin D (25 hydroxy) - Basic Metabolic Panel (BMET) - Magnesium Patient will be called with lab results.  We will follow-up with his PCP regarding any findings.  Gastroesophageal reflux disease, esophagitis presence not specified New onset reflux symptoms likely secondary to increased in NSAID use. -Omeprazole 40 mg daily for 2 weeks, if seeing improvement in symptoms continue for at least 4 weeks.   -He does not feel he is able to avoid using NSAIDs at this time  secondary to his muscle skeletal conditions.  Discussed different options with him and he is agreeable to try diclofenac  once daily with a meal to decrease high dose NSAID/pill burden. -He declines any muscle relaxant, he does not feel these work well for him -Continue gabapentin prescribed by a neurologist. Follow-up with PCP in 4 weeks  Reviewed expectations re: course of current medical issues.  Discussed self-management of symptoms.  Outlined signs and symptoms indicating need for more acute intervention.  Patient verbalized understanding and all questions were answered.  Patient received an After-Visit Summary.    Orders Placed This Encounter  Procedures  . B12  . Vitamin D (25 hydroxy)  . Basic Metabolic Panel (BMET)  . Magnesium   > 25 minutes spent with patient, >50% of time spent face to face     Note is dictated utilizing voice recognition software. Although note has been proof read prior to signing, occasional typographical errors still can be missed. If any questions arise, please do not hesitate to call for verification.   electronically signed by:  Howard Pouch, DO  [place]

## 2019-05-23 NOTE — Patient Instructions (Signed)
Start Voltaren start daily with food- once daily only. We will call you with lab results.   Start omeprazole daily for at least 2 weeks, if you see improvement>>> continue for 4 weeks.      Gastroesophageal Reflux Disease, Adult Gastroesophageal reflux (GER) happens when acid from the stomach flows up into the tube that connects the mouth and the stomach (esophagus). Normally, food travels down the esophagus and stays in the stomach to be digested. However, when a person has GER, food and stomach acid sometimes move back up into the esophagus. If this becomes a more serious problem, the person may be diagnosed with a disease called gastroesophageal reflux disease (GERD). GERD occurs when the reflux:  Happens often.  Causes frequent or severe symptoms.  Causes problems such as damage to the esophagus. When stomach acid comes in contact with the esophagus, the acid may cause soreness (inflammation) in the esophagus. Over time, GERD may create small holes (ulcers) in the lining of the esophagus. What are the causes? This condition is caused by a problem with the muscle between the esophagus and the stomach (lower esophageal sphincter, or LES). Normally, the LES muscle closes after food passes through the esophagus to the stomach. When the LES is weakened or abnormal, it does not close properly, and that allows food and stomach acid to go back up into the esophagus. The LES can be weakened by certain dietary substances, medicines, and medical conditions, including:  Tobacco use.  Pregnancy.  Having a hiatal hernia.  Alcohol use.  Certain foods and beverages, such as coffee, chocolate, onions, and peppermint. What increases the risk? You are more likely to develop this condition if you:  Have an increased body weight.  Have a connective tissue disorder.  Use NSAID medicines. What are the signs or symptoms? Symptoms of this condition include:  Heartburn.  Difficult or painful  swallowing.  The feeling of having a lump in the throat.  Abitter taste in the mouth.  Bad breath.  Having a large amount of saliva.  Having an upset or bloated stomach.  Belching.  Chest pain. Different conditions can cause chest pain. Make sure you see your health care provider if you experience chest pain.  Shortness of breath or wheezing.  Ongoing (chronic) cough or a night-time cough.  Wearing away of tooth enamel.  Weight loss. How is this diagnosed? Your health care provider will take a medical history and perform a physical exam. To determine if you have mild or severe GERD, your health care provider may also monitor how you respond to treatment. You may also have tests, including:  A test to examine your stomach and esophagus with a small camera (endoscopy).  A test thatmeasures the acidity level in your esophagus.  A test thatmeasures how much pressure is on your esophagus.  A barium swallow or modified barium swallow test to show the shape, size, and functioning of your esophagus. How is this treated? The goal of treatment is to help relieve your symptoms and to prevent complications. Treatment for this condition may vary depending on how severe your symptoms are. Your health care provider may recommend:  Changes to your diet.  Medicine.  Surgery. Follow these instructions at home: Eating and drinking   Follow a diet as recommended by your health care provider. This may involve avoiding foods and drinks such as: ? Coffee and tea (with or without caffeine). ? Drinks that containalcohol. ? Energy drinks and sports drinks. ? Carbonated drinks or  sodas. ? Chocolate and cocoa. ? Peppermint and mint flavorings. ? Garlic and onions. ? Horseradish. ? Spicy and acidic foods, including peppers, chili powder, curry powder, vinegar, hot sauces, and barbecue sauce. ? Citrus fruit juices and citrus fruits, such as oranges, lemons, and limes. ? Tomato-based  foods, such as red sauce, chili, salsa, and pizza with red sauce. ? Fried and fatty foods, such as donuts, french fries, potato chips, and high-fat dressings. ? High-fat meats, such as hot dogs and fatty cuts of red and white meats, such as rib eye steak, sausage, ham, and bacon. ? High-fat dairy items, such as whole milk, butter, and cream cheese.  Eat small, frequent meals instead of large meals.  Avoid drinking large amounts of liquid with your meals.  Avoid eating meals during the 2-3 hours before bedtime.  Avoid lying down right after you eat.  Do not exercise right after you eat. Lifestyle   Do not use any products that contain nicotine or tobacco, such as cigarettes, e-cigarettes, and chewing tobacco. If you need help quitting, ask your health care provider.  Try to reduce your stress by using methods such as yoga or meditation. If you need help reducing stress, ask your health care provider.  If you are overweight, reduce your weight to an amount that is healthy for you. Ask your health care provider for guidance about a safe weight loss goal. General instructions  Pay attention to any changes in your symptoms.  Take over-the-counter and prescription medicines only as told by your health care provider. Do not take aspirin, ibuprofen, or other NSAIDs unless your health care provider told you to do so.  Wear loose-fitting clothing. Do not wear anything tight around your waist that causes pressure on your abdomen.  Raise (elevate) the head of your bed about 6 inches (15 cm).  Avoid bending over if this makes your symptoms worse.  Keep all follow-up visits as told by your health care provider. This is important. Contact a health care provider if:  You have: ? New symptoms. ? Unexplained weight loss. ? Difficulty swallowing or it hurts to swallow. ? Wheezing or a persistent cough. ? A hoarse voice.  Your symptoms do not improve with treatment. Get help right away if  you:  Have pain in your arms, neck, jaw, teeth, or back.  Feel sweaty, dizzy, or light-headed.  Have chest pain or shortness of breath.  Vomit and your vomit looks like blood or coffee grounds.  Faint.  Have stool that is bloody or black.  Cannot swallow, drink, or eat. Summary  Gastroesophageal reflux happens when acid from the stomach flows up into the esophagus. GERD is a disease in which the reflux happens often, causes frequent or severe symptoms, or causes problems such as damage to the esophagus.  Treatment for this condition may vary depending on how severe your symptoms are. Your health care provider may recommend diet and lifestyle changes, medicine, or surgery.  Contact a health care provider if you have new or worsening symptoms.  Take over-the-counter and prescription medicines only as told by your health care provider. Do not take aspirin, ibuprofen, or other NSAIDs unless your health care provider told you to do so.  Keep all follow-up visits as told by your health care provider. This is important. This information is not intended to replace advice given to you by your health care provider. Make sure you discuss any questions you have with your health care provider. Document Released: 09/08/2005 Document  Revised: 06/07/2018 Document Reviewed: 06/07/2018 Elsevier Interactive Patient Education  [employer].

## 2019-05-25 ENCOUNTER — Encounter: Payer: Self-pay | Admitting: Family Medicine

## 2019-05-25 DIAGNOSIS — R202 Paresthesia of skin: Secondary | ICD-10-CM | POA: Insufficient documentation

## 2019-05-25 DIAGNOSIS — M542 Cervicalgia: Secondary | ICD-10-CM | POA: Diagnosis not present

## 2019-05-25 DIAGNOSIS — M62838 Other muscle spasm: Secondary | ICD-10-CM | POA: Insufficient documentation

## 2019-05-28 DIAGNOSIS — J3089 Other allergic rhinitis: Secondary | ICD-10-CM | POA: Diagnosis not present

## 2019-05-28 DIAGNOSIS — J301 Allergic rhinitis due to pollen: Secondary | ICD-10-CM | POA: Diagnosis not present

## 2019-05-28 DIAGNOSIS — J3081 Allergic rhinitis due to animal (cat) (dog) hair and dander: Secondary | ICD-10-CM | POA: Diagnosis not present

## 2019-05-31 DIAGNOSIS — F419 Anxiety disorder, unspecified: Secondary | ICD-10-CM | POA: Diagnosis not present

## 2019-06-01 DIAGNOSIS — M542 Cervicalgia: Secondary | ICD-10-CM | POA: Diagnosis not present

## 2019-06-04 DIAGNOSIS — J3081 Allergic rhinitis due to animal (cat) (dog) hair and dander: Secondary | ICD-10-CM | POA: Diagnosis not present

## 2019-06-04 DIAGNOSIS — J301 Allergic rhinitis due to pollen: Secondary | ICD-10-CM | POA: Diagnosis not present

## 2019-06-08 DIAGNOSIS — J3081 Allergic rhinitis due to animal (cat) (dog) hair and dander: Secondary | ICD-10-CM | POA: Diagnosis not present

## 2019-06-08 DIAGNOSIS — M542 Cervicalgia: Secondary | ICD-10-CM | POA: Diagnosis not present

## 2019-06-08 DIAGNOSIS — J301 Allergic rhinitis due to pollen: Secondary | ICD-10-CM | POA: Diagnosis not present

## 2019-06-08 DIAGNOSIS — J3089 Other allergic rhinitis: Secondary | ICD-10-CM | POA: Diagnosis not present

## 2019-06-11 DIAGNOSIS — J3081 Allergic rhinitis due to animal (cat) (dog) hair and dander: Secondary | ICD-10-CM | POA: Diagnosis not present

## 2019-06-11 DIAGNOSIS — J3089 Other allergic rhinitis: Secondary | ICD-10-CM | POA: Diagnosis not present

## 2019-06-11 DIAGNOSIS — M542 Cervicalgia: Secondary | ICD-10-CM | POA: Diagnosis not present

## 2019-06-11 DIAGNOSIS — J301 Allergic rhinitis due to pollen: Secondary | ICD-10-CM | POA: Diagnosis not present

## 2019-06-14 DIAGNOSIS — F419 Anxiety disorder, unspecified: Secondary | ICD-10-CM | POA: Diagnosis not present

## 2019-06-18 DIAGNOSIS — M542 Cervicalgia: Secondary | ICD-10-CM | POA: Diagnosis not present

## 2019-06-19 ENCOUNTER — Other Ambulatory Visit: Payer: Self-pay | Admitting: Neurological Surgery

## 2019-06-19 DIAGNOSIS — J3089 Other allergic rhinitis: Secondary | ICD-10-CM | POA: Diagnosis not present

## 2019-06-19 DIAGNOSIS — J3081 Allergic rhinitis due to animal (cat) (dog) hair and dander: Secondary | ICD-10-CM | POA: Diagnosis not present

## 2019-06-19 DIAGNOSIS — J301 Allergic rhinitis due to pollen: Secondary | ICD-10-CM | POA: Diagnosis not present

## 2019-06-20 ENCOUNTER — Other Ambulatory Visit: Payer: Self-pay | Admitting: Neurological Surgery

## 2019-06-20 ENCOUNTER — Other Ambulatory Visit (HOSPITAL_COMMUNITY): Payer: Self-pay | Admitting: Neurological Surgery

## 2019-06-20 DIAGNOSIS — M40294 Other kyphosis, thoracic region: Secondary | ICD-10-CM

## 2019-06-21 ENCOUNTER — Ambulatory Visit: Payer: BC Managed Care – PPO | Admitting: Family Medicine

## 2019-06-22 ENCOUNTER — Encounter: Payer: Self-pay | Admitting: Family Medicine

## 2019-06-22 ENCOUNTER — Other Ambulatory Visit: Payer: Self-pay

## 2019-06-22 ENCOUNTER — Ambulatory Visit (INDEPENDENT_AMBULATORY_CARE_PROVIDER_SITE_OTHER): Payer: BC Managed Care – PPO | Admitting: Family Medicine

## 2019-06-22 VITALS — BP 118/82 | HR 84 | Temp 98.7°F | Resp 16 | Ht 72.0 in | Wt 159.2 lb

## 2019-06-22 DIAGNOSIS — M545 Low back pain, unspecified: Secondary | ICD-10-CM

## 2019-06-22 DIAGNOSIS — M542 Cervicalgia: Secondary | ICD-10-CM | POA: Diagnosis not present

## 2019-06-22 DIAGNOSIS — K2950 Unspecified chronic gastritis without bleeding: Secondary | ICD-10-CM

## 2019-06-22 DIAGNOSIS — E538 Deficiency of other specified B group vitamins: Secondary | ICD-10-CM | POA: Diagnosis not present

## 2019-06-22 LAB — VITAMIN B12: Vitamin B-12: >1515 pg/mL — ABNORMAL HIGH (ref 211–911)

## 2019-06-22 NOTE — Progress Notes (Signed)
OFFICE VISIT  06/22/2019   CC:  Chief Complaint  Patient presents with  . Follow-up    B12 level, GERD and spasms    HPI:    Patient is a 37 y.o. Caucasian male who presents for 1 mo f/u paresthesias, myalgias, kyphosis or thoracic region, GERD.  He was seen by Dr. Raoul Pitch initially for these problems on 05/23/19. Labs showed low Vit B12 and he was instructed to start otc vit B12 1000 mcg tab qd and f/u today. He was switched from otc nsaids to diclofenac to try to decrease GI sx's from nsaids and he was rx 'd omeprazole '40mg'$  qd.  Interim hx:  Dr. Ellene Route to do thoracolumbar fusion surgery.  Hopefully this will help his chronic back pain and HA's that this back pain triggers.  He has chronic numbness in both arms, believed to be from his back problems.  He quit all NSAIDs and muscle relaxers.  He takes tylenol.  Says the voltaren didn't do much help, GI upset still occurred.  The prilosec made him too constipated so he only took it for 1 week.  He now takes otc pepcid qAM and mid day every day.  He has adjusted his diet and this has helped with GERD/dyspepsia. He has been taking B12   Says mom has pernicious anemia and gets B12 injections.  ROS: no CP, no SOB, no wheezing, no cough, no dizziness, no rashes, no melena/hematochezia.  No polyuria or polydipsia.   Past Medical History:  Diagnosis Date  . ADD (attention deficit disorder)   . Allergic rhinitis    + allerg conjunctivitis.  Immunotherapy started 2019  . Anxiety and depression   . Chronic prostatitis    chronic irritative voiding symptoms: pelvic PT via [facility] helping as of 07/2016  . DDD (degenerative disc disease), cervical 08/2018   (Dr. Maia Petties):  C5-C6 --ESI 25% improvement in fall 2019.  Pt desiring C spine surgery to see if this alleviates his symptom complex..  . Diverticulosis    with 'itis per pt report; however, review of old record CT reports shows mild SB enteritis with mesenteric adenopathy 06/2015;  then 11/2014 CT abd/pelv done for urinary frequency and hematuria showed NORMAL abd/pelv  . Elevation of optic disc, bilateral 2019   Normal variant per ophtho--Dr. Larose Kells.  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 papilledema on optho exam.  Dr. [name] with neuro saw him 04/2018, no papilledema noted and opening CSF pressure was nl.  CSF labs nl.  ESR and ANA normal.  No clear reason for HA's found.  Imipr trial no help.  Trig pt inj's in occipi mm's 05/2018 by Dr. [name]. no help.Topamax trial 02/2019.  Marland Kitchen Headache syndrome 02/2018   As of 07/2018, pt has plan to see Dr. [name], NS/spine specialist as next step (his HAs are likely cervicogenic).  Dr. Maia Petties did C7-T1 ESI on 09/11/18--25% improvement.   . IBS (irritable bowel syndrome)    diarr predom as per old PCP records  . Nephrolithiasis    Nonobstructive, noted on CT (1-2 mm)-has never passed a stone that he knows of.  . 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 Spondylosis without myelopathy or radiculopathy, thoracic region 2019  . Vision abnormalities     Past Surgical History:  Procedure Laterality Date  . LASIK Bilateral 2011  . REFRACTIVE SURGERY Bilateral    Social History   Socioeconomic  History  . Marital status: Married    Spouse name: Not on file  . Number of children: Not on file  . Years of education: Not on file  . Highest education level: Not on file  Occupational History  . Not on file  Social Needs  . Financial resource strain: Not on file  . Food insecurity    Worry: Not on file    Inability: Not on file  . Transportation needs    Medical: Not on file    Non-medical: Not on file  Tobacco Use  . Smoking status: Never Smoker  . Smokeless tobacco: Never Used  Substance and Sexual Activity  . Alcohol use: No  . Drug use: No  . Sexual activity: Not on file  Lifestyle  . Physical activity    Days  per week: Not on file    Minutes per session: Not on file  . Stress: Not on file  Relationships  . Social Herbalist on phone: Not on file    Gets together: Not on file    Attends religious service: Not on file    Active member of club or organization: Not on file    Attends meetings of clubs or organizations: Not on file    Relationship status: Not on file  Other Topics Concern  . Not on file  Social History Narrative   Married, 1 child and 1 on the way.   Relocated from [facility] to [facility] 08/2015.   Educ: college at [employer].   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   No T/A/Ds.    Outpatient Medications Prior to Visit  Medication Sig Dispense Refill  . acetaminophen (TYLENOL) 500 MG tablet Take 500 mg by mouth every 6 (six) hours as needed.    . ADDERALL XR 10 MG 24 hr capsule Take 10 mg by mouth daily.  0  . fexofenadine (ALLEGRA) 180 MG tablet Take 180 mg by mouth daily.    Marland Kitchen gabapentin (NEURONTIN) 300 MG capsule Take one po qAM, one po qPM and teo po qHS 120 capsule 11  . vitamin B-12 (CYANOCOBALAMIN) 1000 MCG tablet Take 1,000 mcg by mouth daily.    Marland Kitchen ALPRAZolam (XANAX) 0.25 MG tablet Take 1 tablet by mouth daily as needed. Reported on 02/20/2016  2  . fluticasone (FLONASE) 50 MCG/ACT nasal spray Place into both nostrils daily.    Marland Kitchen omeprazole (PRILOSEC) 40 MG capsule Take 1 capsule (40 mg total) by mouth daily. (Patient not taking: Reported on 06/22/2019) 30 capsule 1  . Diclofenac Sodium CR 100 MG 24 hr tablet Take 1 tablet (100 mg total) by mouth daily. (Patient not taking: Reported on 06/22/2019) 30 tablet 2  . tiZANidine (ZANAFLEX) 2 MG tablet Take 2 tablets (4 mg total) by mouth every 8 (eight) hours as needed for muscle spasms. (Patient not taking: Reported on 05/23/2019) 60 tablet 1  . topiramate (TOPAMAX) 25 MG tablet Take 1 tablet (25 mg total) by mouth 2 (two) times daily. (Patient not taking: Reported on 05/23/2019) 120 tablet 3   No  facility-administered medications prior to visit.     Allergies  Allergen Reactions  . Prednisone Other (See Comments)    Severe anxiety, cognitive impairment, "hallucinations"---"I never want to take that med again"    ROS As per HPI  PE: Blood pressure 118/82, pulse 84, temperature 98.7 F (37.1 C), temperature source Temporal, resp. rate 16, height 6' (1.829 m), weight 159 lb 3.2 oz (  72.2 kg), SpO2 98 %. Gen: Alert, well appearing.  Patient is oriented to person, place, time, and situation. AFFECT: pleasant, lucid thought and speech. JEH:UDJS: no injection, icteris, swelling, or exudate.  EOMI, PERRLA. Mouth: lips without lesion/swelling.  Oral mucosa pink and moist. Oropharynx without erythema, exudate, or swelling.  CV: RRR, no m/r/g.   LUNGS: CTA bilat, nonlabored resps, good aeration in all lung fields. ABD: soft, NT/ND EXT: no clubbing or cyanosis.  no edema.  SKIN: no pallor or jaundice  LABS:  Lab Results  Component Value Date   VITAMINB12 218 05/23/2019     Chemistry      Component Value Date/Time   NA 140 05/23/2019 1134   K 4.0 05/23/2019 1134   CL 104 05/23/2019 1134   CO2 30 05/23/2019 1134   BUN 8 05/23/2019 1134   CREATININE 0.91 05/23/2019 1134   CREATININE 1.04 07/29/2016 0803      Component Value Date/Time   CALCIUM 9.8 05/23/2019 1134   ALKPHOS 47 07/29/2016 0803   AST 10 07/29/2016 0803   ALT 10 07/29/2016 0803   BILITOT 1.1 07/29/2016 0803     Lab Results  Component Value Date   WBC 4.3 07/29/2016   HGB 15.0 07/29/2016   HCT 44.3 07/29/2016   MCV 87.5 07/29/2016   PLT 178 07/29/2016   Lab Results  Component Value Date   TSH 1.31 07/29/2016   Vit D and mag levels normal 05/23/19  IMPRESSION AND PLAN:  1) Vit B12 def: likely secondary to chronic gastritis. Will recheck B12 levels today. Check methylmalonic acid level and intrinsic factor antibodies. Continue pepcid bid since he tolerates this well and is feeling better and is off  NSAIDs. (Omeprazole-->bad constipation).  2) Chronic back pain with paresthesias and myalgias: hopefully his upcoming back fusion surgery in August will be of significant help for this. He is avoiding all NSAIDs now. Tylenol for pain.  An After Visit Summary was printed and given to the patient.  FOLLOW UP: Return in about 3 months (around 09/22/2019) for routine chronic illness f/u (30 min).  Signed:  Crissie Sickles, MD           06/22/2019

## 2019-06-25 DIAGNOSIS — M542 Cervicalgia: Secondary | ICD-10-CM | POA: Diagnosis not present

## 2019-06-26 DIAGNOSIS — J3081 Allergic rhinitis due to animal (cat) (dog) hair and dander: Secondary | ICD-10-CM | POA: Diagnosis not present

## 2019-06-26 DIAGNOSIS — J301 Allergic rhinitis due to pollen: Secondary | ICD-10-CM | POA: Diagnosis not present

## 2019-06-26 DIAGNOSIS — M40294 Other kyphosis, thoracic region: Secondary | ICD-10-CM | POA: Diagnosis not present

## 2019-06-26 DIAGNOSIS — J3089 Other allergic rhinitis: Secondary | ICD-10-CM | POA: Diagnosis not present

## 2019-06-27 LAB — INTRINSIC FACTOR ANTIBODIES: Intrinsic Factor: NEGATIVE

## 2019-06-27 LAB — METHYLMALONIC ACID, SERUM: Methylmalonic Acid, Quant: 134 nmol/L (ref 87–318)

## 2019-06-28 ENCOUNTER — Encounter: Payer: Self-pay | Admitting: Family Medicine

## 2019-06-28 DIAGNOSIS — J301 Allergic rhinitis due to pollen: Secondary | ICD-10-CM | POA: Diagnosis not present

## 2019-06-28 DIAGNOSIS — F419 Anxiety disorder, unspecified: Secondary | ICD-10-CM | POA: Diagnosis not present

## 2019-06-28 DIAGNOSIS — J3081 Allergic rhinitis due to animal (cat) (dog) hair and dander: Secondary | ICD-10-CM | POA: Diagnosis not present

## 2019-06-28 DIAGNOSIS — J3089 Other allergic rhinitis: Secondary | ICD-10-CM | POA: Diagnosis not present

## 2019-06-29 DIAGNOSIS — M542 Cervicalgia: Secondary | ICD-10-CM | POA: Diagnosis not present

## 2019-07-02 DIAGNOSIS — J301 Allergic rhinitis due to pollen: Secondary | ICD-10-CM | POA: Diagnosis not present

## 2019-07-02 DIAGNOSIS — J3081 Allergic rhinitis due to animal (cat) (dog) hair and dander: Secondary | ICD-10-CM | POA: Diagnosis not present

## 2019-07-02 DIAGNOSIS — J3089 Other allergic rhinitis: Secondary | ICD-10-CM | POA: Diagnosis not present

## 2019-07-02 DIAGNOSIS — M542 Cervicalgia: Secondary | ICD-10-CM | POA: Diagnosis not present

## 2019-07-05 DIAGNOSIS — J3089 Other allergic rhinitis: Secondary | ICD-10-CM | POA: Diagnosis not present

## 2019-07-05 DIAGNOSIS — F419 Anxiety disorder, unspecified: Secondary | ICD-10-CM | POA: Diagnosis not present

## 2019-07-05 DIAGNOSIS — J3081 Allergic rhinitis due to animal (cat) (dog) hair and dander: Secondary | ICD-10-CM | POA: Diagnosis not present

## 2019-07-05 DIAGNOSIS — J301 Allergic rhinitis due to pollen: Secondary | ICD-10-CM | POA: Diagnosis not present

## 2019-07-06 DIAGNOSIS — M542 Cervicalgia: Secondary | ICD-10-CM | POA: Diagnosis not present

## 2019-07-07 ENCOUNTER — Encounter: Payer: Self-pay | Admitting: Family Medicine

## 2019-07-09 DIAGNOSIS — M542 Cervicalgia: Secondary | ICD-10-CM | POA: Diagnosis not present

## 2019-07-10 ENCOUNTER — Other Ambulatory Visit: Payer: Self-pay

## 2019-07-10 ENCOUNTER — Ambulatory Visit (HOSPITAL_COMMUNITY)
Admission: RE | Admit: 2019-07-10 | Discharge: 2019-07-10 | Disposition: A | Discharge status: Home / Self Care | Payer: BC Managed Care – PPO | Source: Ambulatory Visit | Attending: Neurological Surgery | Admitting: Neurological Surgery

## 2019-07-10 ENCOUNTER — Other Ambulatory Visit (HOSPITAL_COMMUNITY): Payer: Self-pay | Admitting: Neurological Surgery

## 2019-07-10 ENCOUNTER — Encounter (HOSPITAL_COMMUNITY): Payer: Self-pay

## 2019-07-10 ENCOUNTER — Encounter (HOSPITAL_COMMUNITY)
Admission: RE | Admit: 2019-07-10 | Discharge: 2019-07-10 | Disposition: A | Discharge status: Home / Self Care | Payer: BC Managed Care – PPO | Source: Ambulatory Visit | Attending: Neurological Surgery | Admitting: Neurological Surgery

## 2019-07-10 DIAGNOSIS — J3081 Allergic rhinitis due to animal (cat) (dog) hair and dander: Secondary | ICD-10-CM | POA: Diagnosis not present

## 2019-07-10 DIAGNOSIS — J301 Allergic rhinitis due to pollen: Secondary | ICD-10-CM | POA: Diagnosis not present

## 2019-07-10 DIAGNOSIS — M4804 Spinal stenosis, thoracic region: Secondary | ICD-10-CM | POA: Diagnosis not present

## 2019-07-10 DIAGNOSIS — M40294 Other kyphosis, thoracic region: Secondary | ICD-10-CM | POA: Insufficient documentation

## 2019-07-10 DIAGNOSIS — M47814 Spondylosis without myelopathy or radiculopathy, thoracic region: Secondary | ICD-10-CM | POA: Diagnosis not present

## 2019-07-10 DIAGNOSIS — J3089 Other allergic rhinitis: Secondary | ICD-10-CM | POA: Diagnosis not present

## 2019-07-10 HISTORY — DX: Concussion with loss of consciousness of unspecified duration, initial encounter: S06.0X9A

## 2019-07-10 HISTORY — DX: Concussion with loss of consciousness status unknown, initial encounter: S06.0XAA

## 2019-07-10 HISTORY — DX: Other seasonal allergic rhinitis: J30.2

## 2019-07-10 HISTORY — DX: Panic disorder (episodic paroxysmal anxiety): F41.0

## 2019-07-10 LAB — BASIC METABOLIC PANEL
Anion gap: 9 (ref 5–15)
BUN: 9 mg/dL (ref 6–20)
CO2: 27 mmol/L (ref 22–32)
Calcium: 9.5 mg/dL (ref 8.9–10.3)
Chloride: 103 mmol/L (ref 98–111)
Creatinine, Ser: 0.92 mg/dL (ref 0.61–1.24)
GFR calc Af Amer: >60 mL/min (ref >60)
GFR calc non Af Amer: >60 mL/min (ref >60)
Glucose, Bld: 94 mg/dL (ref 70–99)
Potassium: 3.8 mmol/L (ref 3.5–5.1)
Sodium: 139 mmol/L (ref 135–145)

## 2019-07-10 LAB — CBC
HCT: 44.5 % (ref 39.0–52.0)
Hemoglobin: 15.1 g/dL (ref 13.0–17.0)
MCH: 31.2 pg (ref 26.0–34.0)
MCHC: 33.9 g/dL (ref 30.0–36.0)
MCV: 91.9 fL (ref 80.0–100.0)
Platelets: 176 10*3/uL (ref 150–400)
RBC: 4.84 MIL/uL (ref 4.22–5.81)
RDW: 12.8 % (ref 11.5–15.5)
WBC: 7.1 10*3/uL (ref 4.0–10.5)
nRBC: 0 % (ref 0.0–0.2)

## 2019-07-10 LAB — SURGICAL PCR SCREEN
MRSA, PCR: NEGATIVE
Staphylococcus aureus: NEGATIVE

## 2019-07-10 LAB — ABO/RH: ABO/RH(D): A POS

## 2019-07-10 NOTE — Progress Notes (Signed)
PCP - Dr,. Randa Spike Gowen  Cardiologist - none  Chest x-ray - na  EKG - na  Stress Test - no  ECHO - no  Cardiac Cath - no  Sleep Study - no CPAP - no  LABS-CBC, BMP, T/S. PCR  ASA-no  ERAS-no  HA1C-NA Fasting Blood Sugar -na Checks Blood Sugar __na_ times a day  Anesthesia-no  Pt denies having chest pain, sob, or fever at this time. All instructions explained to the pt, with a verbal understanding of the material. Pt agrees to go over the instructions while at home for a better understanding. Pt also instructed to self quarantine after being tested for COVID-19. The opportunity to ask questions was provided. To be tested  07/12/2019

## 2019-07-10 NOTE — Progress Notes (Signed)
[id number] - [place], [place] - [address] [place] [address] Phone: [phone] Fax: [phone]      Your procedure is scheduled on August 3  Report to [facility] Main Entrance "A" at 0530 A.M., and check in at the Admitting office.  Call this number if you have problems the morning of surgery:  [phone]  Call [phone] if you have any questions prior to your surgery date Monday-Friday 8am-4pm    Remember:  Do not eat or drink after midnight the night before your surgery    Take these medicines the morning of surgery with A SIP OF WATER  acetaminophen (TYLENOL)  If needed for pain ALPRAZolam Duanne Moron)  If needed gabapentin (NEURONTIN)   7 days prior to surgery STOP taking any Aspirin (unless otherwise instructed by your surgeon), Aleve, Naproxen, Ibuprofen, Motrin, Advil, Goody's, BC's, all herbal medications, fish oil, and all vitamins.    The Morning of Surgery  Do not wear jewelry  Do not wear lotions, powders, or colognes, or deodorant  Men may shave face and neck.  Do not bring valuables to the hospital.  [facility] is not responsible for any belongings or valuables.  If you are a smoker, DO NOT Smoke 24 hours prior to surgery IF you wear a CPAP at night please bring your mask, tubing, and machine the morning of surgery   Remember that you must have someone to transport you home after your surgery, and remain with you for 24 hours if you are discharged the same day.   Contacts, glasses, hearing aids, dentures or bridgework may not be worn into surgery.    Leave your suitcase in the car.  After surgery it may be brought to your room.  For patients admitted to the hospital, discharge time will be determined by your treatment team.  Patients discharged the day of surgery will not be allowed to drive home.    Special instructions:   [place]- Preparing For Surgery  Before surgery, you can play  an important role. Because skin is not sterile, your skin needs to be as free of germs as possible. You can reduce the number of germs on your skin by washing with CHG (chlorahexidine gluconate) Soap before surgery.  CHG is an antiseptic cleaner which kills germs and bonds with the skin to continue killing germs even after washing.    Oral Hygiene is also important to reduce your risk of infection.  Remember - BRUSH YOUR TEETH THE MORNING OF SURGERY WITH YOUR REGULAR TOOTHPASTE  Please do not use if you have an allergy to CHG or antibacterial soaps. If your skin becomes reddened/irritated stop using the CHG.  Do not shave (including legs and underarms) for at least 48 hours prior to first CHG shower. It is OK to shave your face.  Please follow these instructions carefully.   1. Shower the NIGHT BEFORE SURGERY and the MORNING OF SURGERY with CHG Soap.   2. If you chose to wash your hair, wash your hair first as usual with your normal shampoo.  3. After you shampoo, rinse your hair and body thoroughly to remove the shampoo.  4. Use CHG as you would any other liquid soap. You can apply CHG directly to the skin and wash gently with a scrungie or a clean washcloth.   5. Apply the CHG Soap to your body ONLY FROM THE NECK DOWN.  Do not use on open  wounds or open sores. Avoid contact with your eyes, ears, mouth and genitals (private parts). Wash Face and genitals (private parts)  with your normal soap.   6. Wash thoroughly, paying special attention to the area where your surgery will be performed.  7. Thoroughly rinse your body with warm water from the neck down.  8. DO NOT shower/wash with your normal soap after using and rinsing off the CHG Soap.  9. Pat yourself dry with a CLEAN TOWEL.  10. Wear CLEAN PAJAMAS to bed the night before surgery, wear comfortable clothes the morning of surgery  11. Place CLEAN SHEETS on your bed the night of your first shower and DO NOT SLEEP WITH PETS.    Day  of Surgery:  Do not apply any deodorants/lotions. Please shower the morning of surgery with the CHG soap  Please wear clean clothes to the hospital/surgery center.   Remember to brush your teeth WITH YOUR REGULAR TOOTHPASTE.   Please read over the following fact sheets that you were given.

## 2019-07-10 NOTE — Progress Notes (Signed)
Your procedure is scheduled on August 3  Report to [facility] Main Entrance "A" at 0530 A.M., and check in at the Admitting office.            Your surgery or procedure is scheduled for 7:30 AM  Call this number if you have problems the morning of surgery:  [phone] pre- op desk.  Call [phone] if you have any questions prior to your surgery date Monday-Friday 8am-4pm   Remember:  Do not eat or drink after midnight the night before your surgery    Take these medicines the morning of surgery with A SIP OF WATER  acetaminophen (TYLENOL)  If needed for pain ALPRAZolam Duanne Moron)  If needed gabapentin (NEURONTIN)   7 days prior to surgery STOP taking any Aspirin (unless otherwise instructed by your surgeon), Aleve, Naproxen, Ibuprofen, Motrin, Advil, Goody's, BC's, all herbal medications, fish oil, and all vitamins.  Special instructions:   - Preparing For Surgery  Before surgery, you can play an important role. Because skin is not sterile, your skin needs to be as free of germs as possible. You can reduce the number of germs on your skin by washing with CHG (chlorahexidine gluconate) Soap before surgery.  CHG is an antiseptic cleaner which kills germs and bonds with the skin to continue killing germs even after washing.    Oral Hygiene is also important to reduce your risk of infection.  Remember - BRUSH YOUR TEETH THE MORNING OF SURGERY WITH YOUR REGULAR TOOTHPASTE  Please do not use if you have an allergy to CHG or antibacterial soaps. If your skin becomes reddened/irritated stop using the CHG.  Do not shave (including legs and underarms) for at least 48 hours prior to first CHG shower. It is OK to shave your face.  Please follow these instructions carefully.   1. Shower the NIGHT BEFORE SURGERY and the MORNING OF SURGERY with CHG Soap.   2. If you chose to wash your hair, wash your hair first as usual with your normal shampoo.  3. After you shampoo, rinse  your hair and body thoroughly to remove the shampoo.  4. Use CHG as you would any other liquid soap. You can apply CHG directly to the skin and wash gently with a scrungie or a clean washcloth.   5. Apply the CHG Soap to your body ONLY FROM THE NECK DOWN.  Do not use on open wounds or open sores. Avoid contact with your eyes, ears, mouth and genitals (private parts). Wash Face and genitals (private parts)  with your normal soap.   6. Wash thoroughly, paying special attention to the area where your surgery will be performed.  7. Thoroughly rinse your body with warm water from the neck down.  8. DO NOT shower/wash with your normal soap after using and rinsing off the CHG Soap.  9. Pat yourself dry with a CLEAN TOWEL.  10. Wear CLEAN PAJAMAS to bed the night before surgery, wear comfortable clothes the morning of surgery  11. Place CLEAN SHEETS on your bed the night of your first shower and DO NOT SLEEP WITH PETS.  Day of Surgery: Shower as instructed above. Do not wear lotions, powders, or colognes, or deodorant Please wear clean clothes to the hospital/surgery center.   Remember to brush your teeth WITH YOUR REGULAR TOOTHPASTE.  Do not wear jewelry\  Men may shave face and neck.  Do not bring valuables to the hospital.  [facility] is not responsible  for any belongings or valuables.  If you are a smoker, DO NOT Smoke 24 hours prior to surgery IF you wear a CPAP at night please bring your mask, tubing, and machine the morning of surgery   Remember that you must have someone to transport you home after your surgery, and remain with you for 24 hours if you are discharged the same day.  Contacts, glasses, hearing aids, dentures or bridgework may not be worn into surgery.   Leave your suitcase in the car.  After surgery it may be brought to your room.  For patients admitted to the hospital, discharge time will be determined by your treatment team.  Patients discharged the day of  surgery will not be allowed to drive home.   Please read over the following fact sheets that you were given.

## 2019-07-11 DIAGNOSIS — F902 Attention-deficit hyperactivity disorder, combined type: Secondary | ICD-10-CM | POA: Diagnosis not present

## 2019-07-11 DIAGNOSIS — F338 Other recurrent depressive disorders: Secondary | ICD-10-CM | POA: Diagnosis not present

## 2019-07-11 DIAGNOSIS — F419 Anxiety disorder, unspecified: Secondary | ICD-10-CM | POA: Diagnosis not present

## 2019-07-11 DIAGNOSIS — R41840 Attention and concentration deficit: Secondary | ICD-10-CM | POA: Diagnosis not present

## 2019-07-11 DIAGNOSIS — Z79899 Other long term (current) drug therapy: Secondary | ICD-10-CM | POA: Diagnosis not present

## 2019-07-12 ENCOUNTER — Other Ambulatory Visit (HOSPITAL_COMMUNITY)
Admission: RE | Admit: 2019-07-12 | Discharge: 2019-07-12 | Disposition: A | Discharge status: Home / Self Care | Payer: BC Managed Care – PPO | Source: Ambulatory Visit | Attending: Neurological Surgery | Admitting: Neurological Surgery

## 2019-07-12 DIAGNOSIS — Z20828 Contact with and (suspected) exposure to other viral communicable diseases: Secondary | ICD-10-CM | POA: Diagnosis not present

## 2019-07-12 DIAGNOSIS — F419 Anxiety disorder, unspecified: Secondary | ICD-10-CM | POA: Diagnosis not present

## 2019-07-13 DIAGNOSIS — M40294 Other kyphosis, thoracic region: Secondary | ICD-10-CM | POA: Diagnosis not present

## 2019-07-13 LAB — SARS CORONAVIRUS 2 (TAT 6-24 HRS): SARS Coronavirus 2: NEGATIVE

## 2019-07-15 NOTE — Anesthesia Preprocedure Evaluation (Addendum)
Anesthesia Evaluation  Patient identified by MRN, date of birth, ID band Patient awake    Reviewed: Allergy & Precautions, NPO status , Patient's Chart, lab work & pertinent test results  Airway Mallampati: II  TM Distance: >3 FB     Dental  (+) Dental Advisory Given   Pulmonary neg pulmonary ROS,    breath sounds clear to auscultation       Cardiovascular negative cardio ROS   Rhythm:Regular Rate:Normal     Neuro/Psych  Headaches,    GI/Hepatic Neg liver ROS, GERD  ,  Endo/Other  negative endocrine ROS  Renal/GU Renal disease     Musculoskeletal  (+) Arthritis ,   Abdominal   Peds  Hematology negative hematology ROS (+)   Anesthesia Other Findings   Reproductive/Obstetrics                            Lab Results  Component Value Date   WBC 7.1 07/10/2019   HGB 15.1 07/10/2019   HCT 44.5 07/10/2019   MCV 91.9 07/10/2019   PLT 176 07/10/2019   Lab Results  Component Value Date   CREATININE 0.92 07/10/2019   BUN 9 07/10/2019   NA 139 07/10/2019   K 3.8 07/10/2019   CL 103 07/10/2019   CO2 27 07/10/2019    Anesthesia Physical Anesthesia Plan  ASA: II  Anesthesia Plan: General   Post-op Pain Management:    Induction: Intravenous  PONV Risk Score and Plan: 2 and Dexamethasone, Ondansetron and Treatment may vary due to age or medical condition  Airway Management Planned: Oral ETT  Additional Equipment: Arterial line  Intra-op Plan:   Post-operative Plan: Extubation in OR  Informed Consent: I have reviewed the patients History and Physical, chart, labs and discussed the procedure including the risks, benefits and alternatives for the proposed anesthesia with the patient or authorized representative who has indicated his/her understanding and acceptance.     Dental advisory given  Plan Discussed with: CRNA  Anesthesia Plan Comments:        Anesthesia Quick  Evaluation

## 2019-07-16 ENCOUNTER — Inpatient Hospital Stay (HOSPITAL_COMMUNITY): Payer: BC Managed Care – PPO

## 2019-07-16 ENCOUNTER — Inpatient Hospital Stay (HOSPITAL_COMMUNITY): Payer: BC Managed Care – PPO | Admitting: Anesthesiology

## 2019-07-16 ENCOUNTER — Inpatient Hospital Stay (HOSPITAL_COMMUNITY)
Admission: RE | Disposition: A | Discharge status: Home / Self Care | Payer: Self-pay | Source: Home / Self Care | Attending: Neurological Surgery

## 2019-07-16 ENCOUNTER — Inpatient Hospital Stay (HOSPITAL_COMMUNITY)
Admission: RE | Admit: 2019-07-16 | Discharge: 2019-07-23 | DRG: 457 | Disposition: A | Discharge status: Home / Self Care | Payer: BC Managed Care – PPO | Attending: Neurological Surgery | Admitting: Neurological Surgery

## 2019-07-16 ENCOUNTER — Encounter (HOSPITAL_COMMUNITY): Payer: Self-pay

## 2019-07-16 ENCOUNTER — Other Ambulatory Visit: Payer: Self-pay

## 2019-07-16 DIAGNOSIS — M4046 Postural lordosis, lumbar region: Secondary | ICD-10-CM | POA: Diagnosis not present

## 2019-07-16 DIAGNOSIS — D62 Acute posthemorrhagic anemia: Secondary | ICD-10-CM | POA: Diagnosis not present

## 2019-07-16 DIAGNOSIS — M4205 Juvenile osteochondrosis of spine, thoracolumbar region: Secondary | ICD-10-CM | POA: Diagnosis not present

## 2019-07-16 DIAGNOSIS — N200 Calculus of kidney: Secondary | ICD-10-CM | POA: Diagnosis not present

## 2019-07-16 DIAGNOSIS — M40295 Other kyphosis, thoracolumbar region: Secondary | ICD-10-CM | POA: Diagnosis not present

## 2019-07-16 DIAGNOSIS — M50322 Other cervical disc degeneration at C5-C6 level: Secondary | ICD-10-CM | POA: Diagnosis present

## 2019-07-16 DIAGNOSIS — Z811 Family history of alcohol abuse and dependence: Secondary | ICD-10-CM | POA: Diagnosis not present

## 2019-07-16 DIAGNOSIS — M4204 Juvenile osteochondrosis of spine, thoracic region: Secondary | ICD-10-CM | POA: Diagnosis not present

## 2019-07-16 DIAGNOSIS — Z981 Arthrodesis status: Secondary | ICD-10-CM | POA: Diagnosis not present

## 2019-07-16 DIAGNOSIS — J95821 Acute postprocedural respiratory failure: Secondary | ICD-10-CM | POA: Diagnosis not present

## 2019-07-16 DIAGNOSIS — Z79899 Other long term (current) drug therapy: Secondary | ICD-10-CM | POA: Diagnosis not present

## 2019-07-16 DIAGNOSIS — Z419 Encounter for procedure for purposes other than remedying health state, unspecified: Secondary | ICD-10-CM

## 2019-07-16 DIAGNOSIS — Z823 Family history of stroke: Secondary | ICD-10-CM | POA: Diagnosis not present

## 2019-07-16 DIAGNOSIS — M40294 Other kyphosis, thoracic region: Secondary | ICD-10-CM | POA: Diagnosis not present

## 2019-07-16 DIAGNOSIS — Z5189 Encounter for other specified aftercare: Secondary | ICD-10-CM

## 2019-07-16 DIAGNOSIS — F329 Major depressive disorder, single episode, unspecified: Secondary | ICD-10-CM | POA: Diagnosis present

## 2019-07-16 DIAGNOSIS — F419 Anxiety disorder, unspecified: Secondary | ICD-10-CM | POA: Diagnosis not present

## 2019-07-16 DIAGNOSIS — F909 Attention-deficit hyperactivity disorder, unspecified type: Secondary | ICD-10-CM | POA: Diagnosis present

## 2019-07-16 DIAGNOSIS — J90 Pleural effusion, not elsewhere classified: Secondary | ICD-10-CM | POA: Diagnosis not present

## 2019-07-16 DIAGNOSIS — M4200 Juvenile osteochondrosis of spine, site unspecified: Secondary | ICD-10-CM | POA: Diagnosis present

## 2019-07-16 DIAGNOSIS — M4324 Fusion of spine, thoracic region: Secondary | ICD-10-CM | POA: Diagnosis not present

## 2019-07-16 DIAGNOSIS — R918 Other nonspecific abnormal finding of lung field: Secondary | ICD-10-CM | POA: Diagnosis not present

## 2019-07-16 DIAGNOSIS — M1990 Unspecified osteoarthritis, unspecified site: Secondary | ICD-10-CM | POA: Diagnosis not present

## 2019-07-16 DIAGNOSIS — Z841 Family history of disorders of kidney and ureter: Secondary | ICD-10-CM

## 2019-07-16 DIAGNOSIS — M47892 Other spondylosis, cervical region: Secondary | ICD-10-CM | POA: Diagnosis not present

## 2019-07-16 DIAGNOSIS — K219 Gastro-esophageal reflux disease without esophagitis: Secondary | ICD-10-CM | POA: Diagnosis not present

## 2019-07-16 DIAGNOSIS — R071 Chest pain on breathing: Secondary | ICD-10-CM

## 2019-07-16 DIAGNOSIS — J9811 Atelectasis: Secondary | ICD-10-CM | POA: Diagnosis not present

## 2019-07-16 DIAGNOSIS — R0902 Hypoxemia: Secondary | ICD-10-CM | POA: Diagnosis not present

## 2019-07-16 DIAGNOSIS — M4014 Other secondary kyphosis, thoracic region: Secondary | ICD-10-CM | POA: Diagnosis not present

## 2019-07-16 DIAGNOSIS — F418 Other specified anxiety disorders: Secondary | ICD-10-CM | POA: Diagnosis not present

## 2019-07-16 DIAGNOSIS — Z8249 Family history of ischemic heart disease and other diseases of the circulatory system: Secondary | ICD-10-CM | POA: Diagnosis not present

## 2019-07-16 DIAGNOSIS — N289 Disorder of kidney and ureter, unspecified: Secondary | ICD-10-CM | POA: Diagnosis not present

## 2019-07-16 DIAGNOSIS — M47814 Spondylosis without myelopathy or radiculopathy, thoracic region: Secondary | ICD-10-CM | POA: Diagnosis not present

## 2019-07-16 DIAGNOSIS — I959 Hypotension, unspecified: Secondary | ICD-10-CM | POA: Diagnosis not present

## 2019-07-16 DIAGNOSIS — R0602 Shortness of breath: Secondary | ICD-10-CM | POA: Diagnosis not present

## 2019-07-16 DIAGNOSIS — R079 Chest pain, unspecified: Secondary | ICD-10-CM | POA: Diagnosis not present

## 2019-07-16 DIAGNOSIS — M4804 Spinal stenosis, thoracic region: Secondary | ICD-10-CM | POA: Diagnosis not present

## 2019-07-16 DIAGNOSIS — R9389 Abnormal findings on diagnostic imaging of other specified body structures: Secondary | ICD-10-CM | POA: Diagnosis not present

## 2019-07-16 HISTORY — PX: APPLICATION OF ROBOTIC ASSISTANCE FOR SPINAL PROCEDURE: SHX6753

## 2019-07-16 HISTORY — PX: POSTERIOR LUMBAR FUSION 4 LEVEL: SHX6037

## 2019-07-16 HISTORY — DX: Encounter for other specified aftercare: Z51.89

## 2019-07-16 LAB — POCT I-STAT 7, (LYTES, BLD GAS, ICA,H+H)
Acid-Base Excess: 1 mmol/L (ref 0.0–2.0)
Acid-base deficit: 1 mmol/L (ref 0.0–2.0)
Bicarbonate: 26.8 mmol/L (ref 20.0–28.0)
Bicarbonate: 24.5 mmol/L (ref 20.0–28.0)
Calcium, Ion: 1.17 mmol/L (ref 1.15–1.40)
Calcium, Ion: 1.16 mmol/L (ref 1.15–1.40)
HCT: 37 % — ABNORMAL LOW (ref 39.0–52.0)
HCT: 35 % — ABNORMAL LOW (ref 39.0–52.0)
Hemoglobin: 12.6 g/dL — ABNORMAL LOW (ref 13.0–17.0)
Hemoglobin: 11.9 g/dL — ABNORMAL LOW (ref 13.0–17.0)
O2 Saturation: 100 %
O2 Saturation: 100 %
Patient temperature: 36.8
Potassium: 4 mmol/L (ref 3.5–5.1)
Potassium: 4.7 mmol/L (ref 3.5–5.1)
Sodium: 140 mmol/L (ref 135–145)
Sodium: 139 mmol/L (ref 135–145)
TCO2: 28 mmol/L (ref 22–32)
TCO2: 26 mmol/L (ref 22–32)
pCO2 arterial: 44.4 mmHg (ref 32.0–48.0)
pCO2 arterial: 41.7 mmHg (ref 32.0–48.0)
pH, Arterial: 7.388 (ref 7.350–7.450)
pH, Arterial: 7.375 (ref 7.350–7.450)
pO2, Arterial: 323 mmHg — ABNORMAL HIGH (ref 83.0–108.0)
pO2, Arterial: 183 mmHg — ABNORMAL HIGH (ref 83.0–108.0)

## 2019-07-16 SURGERY — POSTERIOR LUMBAR FUSION 4 LEVEL
Anesthesia: General | Site: Back

## 2019-07-16 MED ORDER — FLEET ENEMA 7-19 GM/118ML RE ENEM: 1.0000 | ENEMA | Freq: Once | RECTAL | Status: DC | PRN

## 2019-07-16 MED ORDER — ALUM & MAG HYDROXIDE-SIMETH 200-200-20 MG/5ML PO SUSP
30.0000 mL | Freq: Four times a day (QID) | ORAL | Status: DC | PRN
  Administered 2019-07-17 – 2019-07-21 (×3): 30 mL via ORAL
  Filled 2019-07-16 (×3): qty 30

## 2019-07-16 MED ORDER — CEFAZOLIN SODIUM-DEXTROSE 2-4 GM/100ML-% IV SOLN
2.0000 g | INTRAVENOUS | Status: AC
  Administered 2019-07-16 (×3): 2 g via INTRAVENOUS
  Filled 2019-07-16: qty 100

## 2019-07-16 MED ORDER — GLYCOPYRROLATE PF 0.2 MG/ML IJ SOSY
PREFILLED_SYRINGE | INTRAMUSCULAR | Status: DC | PRN
  Administered 2019-07-16: .2 mg via INTRAVENOUS

## 2019-07-16 MED ORDER — THROMBIN 20000 UNITS EX SOLR
CUTANEOUS | Status: AC
  Filled 2019-07-16: qty 20000

## 2019-07-16 MED ORDER — THROMBIN 5000 UNITS EX SOLR
CUTANEOUS | Status: AC
  Filled 2019-07-16: qty 5000

## 2019-07-16 MED ORDER — BUPIVACAINE HCL (PF) 0.25 % IJ SOLN
INTRAMUSCULAR | Status: AC
  Filled 2019-07-16: qty 30

## 2019-07-16 MED ORDER — MIDAZOLAM HCL 2 MG/2ML IJ SOLN
INTRAMUSCULAR | Status: AC
  Filled 2019-07-16: qty 2

## 2019-07-16 MED ORDER — METHOCARBAMOL 500 MG PO TABS
500.0000 mg | ORAL_TABLET | Freq: Four times a day (QID) | ORAL | Status: DC | PRN
  Administered 2019-07-17 – 2019-07-23 (×10): 500 mg via ORAL
  Filled 2019-07-16 (×12): qty 1

## 2019-07-16 MED ORDER — LIDOCAINE-EPINEPHRINE 1 %-1:100000 IJ SOLN
INTRAMUSCULAR | Status: AC
  Filled 2019-07-16: qty 1

## 2019-07-16 MED ORDER — PHENYLEPHRINE 40 MCG/ML (10ML) SYRINGE FOR IV PUSH (FOR BLOOD PRESSURE SUPPORT)
PREFILLED_SYRINGE | INTRAVENOUS | Status: AC
  Filled 2019-07-16: qty 10

## 2019-07-16 MED ORDER — SODIUM CHLORIDE 0.9 % IV BOLUS
500.0000 mL | Freq: Once | INTRAVENOUS | Status: AC
  Administered 2019-07-16: 500 mL via INTRAVENOUS

## 2019-07-16 MED ORDER — ACETAMINOPHEN 500 MG PO TABS
ORAL_TABLET | ORAL | Status: AC
  Administered 2019-07-16: 07:00:00 1000 mg via ORAL
  Filled 2019-07-16: qty 2

## 2019-07-16 MED ORDER — CEFAZOLIN SODIUM 1 G IJ SOLR
INTRAMUSCULAR | Status: AC
  Filled 2019-07-16: qty 20

## 2019-07-16 MED ORDER — ONDANSETRON HCL 4 MG/2ML IJ SOLN
INTRAMUSCULAR | Status: AC
  Filled 2019-07-16: qty 4

## 2019-07-16 MED ORDER — ONDANSETRON HCL 4 MG PO TABS
4.0000 mg | ORAL_TABLET | Freq: Four times a day (QID) | ORAL | Status: DC | PRN
  Administered 2019-07-19: 4 mg via ORAL
  Filled 2019-07-16: qty 1

## 2019-07-16 MED ORDER — GLYCOPYRROLATE PF 0.2 MG/ML IJ SOSY
PREFILLED_SYRINGE | INTRAMUSCULAR | Status: AC
  Filled 2019-07-16: qty 1

## 2019-07-16 MED ORDER — ROCURONIUM BROMIDE 10 MG/ML (PF) SYRINGE
PREFILLED_SYRINGE | INTRAVENOUS | Status: DC | PRN
  Administered 2019-07-16: 40 mg via INTRAVENOUS
  Administered 2019-07-16 (×3): 20 mg via INTRAVENOUS
  Administered 2019-07-16: 30 mg via INTRAVENOUS

## 2019-07-16 MED ORDER — SODIUM CHLORIDE (PF) 0.9 % IJ SOLN
INTRAMUSCULAR | Status: AC
  Filled 2019-07-16: qty 10

## 2019-07-16 MED ORDER — SODIUM CHLORIDE 0.9% FLUSH: 3.0000 mL | INTRAVENOUS | Status: DC | PRN

## 2019-07-16 MED ORDER — ROCURONIUM BROMIDE 10 MG/ML (PF) SYRINGE
PREFILLED_SYRINGE | INTRAVENOUS | Status: AC
  Filled 2019-07-16: qty 20

## 2019-07-16 MED ORDER — DIPHENHYDRAMINE HCL 50 MG/ML IJ SOLN
INTRAMUSCULAR | Status: AC
  Filled 2019-07-16: qty 1

## 2019-07-16 MED ORDER — PROPOFOL 500 MG/50ML IV EMUL
INTRAVENOUS | Status: DC | PRN
  Administered 2019-07-16: 12:00:00 via INTRAVENOUS
  Administered 2019-07-16: 50 ug/kg/min via INTRAVENOUS

## 2019-07-16 MED ORDER — LIDOCAINE-EPINEPHRINE 1 %-1:100000 IJ SOLN
INTRAMUSCULAR | Status: DC | PRN
  Administered 2019-07-16 (×2): 20 mL

## 2019-07-16 MED ORDER — CEFAZOLIN SODIUM-DEXTROSE 2-4 GM/100ML-% IV SOLN
2.0000 g | Freq: Three times a day (TID) | INTRAVENOUS | Status: AC
  Administered 2019-07-16 – 2019-07-17 (×2): 2 g via INTRAVENOUS
  Filled 2019-07-16 (×2): qty 100

## 2019-07-16 MED ORDER — ALBUMIN HUMAN 5 % IV SOLN
INTRAVENOUS | Status: DC | PRN
  Administered 2019-07-16 (×2): via INTRAVENOUS

## 2019-07-16 MED ORDER — HYDROMORPHONE HCL 1 MG/ML IJ SOLN
0.2500 mg | INTRAMUSCULAR | Status: DC | PRN
  Administered 2019-07-16 (×4): 0.5 mg via INTRAVENOUS

## 2019-07-16 MED ORDER — GABAPENTIN 300 MG PO CAPS
300.0000 mg | ORAL_CAPSULE | Freq: Four times a day (QID) | ORAL | Status: DC
  Administered 2019-07-16 – 2019-07-23 (×27): 300 mg via ORAL
  Filled 2019-07-16 (×14): qty 1
  Filled 2019-07-16: qty 3
  Filled 2019-07-16 (×10): qty 1
  Filled 2019-07-16: qty 3
  Filled 2019-07-16 (×3): qty 1

## 2019-07-16 MED ORDER — THROMBIN 5000 UNITS EX SOLR
OROMUCOSAL | Status: DC | PRN
  Administered 2019-07-16 (×5): 5 mL

## 2019-07-16 MED ORDER — ACETAMINOPHEN 325 MG PO TABS
650.0000 mg | ORAL_TABLET | ORAL | Status: DC | PRN
  Administered 2019-07-17 – 2019-07-23 (×4): 650 mg via ORAL
  Filled 2019-07-16 (×5): qty 2

## 2019-07-16 MED ORDER — POLYETHYLENE GLYCOL 3350 17 G PO PACK
17.0000 g | PACK | Freq: Every day | ORAL | Status: DC | PRN
  Administered 2019-07-21 – 2019-07-22 (×2): 17 g via ORAL
  Filled 2019-07-16 (×4): qty 1

## 2019-07-16 MED ORDER — PROPOFOL 10 MG/ML IV BOLUS
INTRAVENOUS | Status: DC | PRN
  Administered 2019-07-16: 200 mg via INTRAVENOUS

## 2019-07-16 MED ORDER — DEXAMETHASONE SODIUM PHOSPHATE 10 MG/ML IJ SOLN
INTRAMUSCULAR | Status: AC
  Filled 2019-07-16: qty 1

## 2019-07-16 MED ORDER — PHENYLEPHRINE 40 MCG/ML (10ML) SYRINGE FOR IV PUSH (FOR BLOOD PRESSURE SUPPORT)
PREFILLED_SYRINGE | INTRAVENOUS | Status: DC | PRN
  Administered 2019-07-16 (×2): 80 ug via INTRAVENOUS
  Administered 2019-07-16: 160 ug via INTRAVENOUS
  Administered 2019-07-16 (×2): 80 ug via INTRAVENOUS
  Administered 2019-07-16: 120 ug via INTRAVENOUS

## 2019-07-16 MED ORDER — PROMETHAZINE HCL 25 MG/ML IJ SOLN: 6.2500 mg | INTRAMUSCULAR | Status: DC | PRN

## 2019-07-16 MED ORDER — SODIUM CHLORIDE 0.9 % IV SOLN: INTRAVENOUS | Status: DC | PRN

## 2019-07-16 MED ORDER — MIDAZOLAM HCL 2 MG/2ML IJ SOLN
INTRAMUSCULAR | Status: DC | PRN
  Administered 2019-07-16: 2 mg via INTRAVENOUS

## 2019-07-16 MED ORDER — LIDOCAINE 2% (20 MG/ML) 5 ML SYRINGE
INTRAMUSCULAR | Status: AC
  Filled 2019-07-16: qty 5

## 2019-07-16 MED ORDER — CHLORHEXIDINE GLUCONATE CLOTH 2 % EX PADS: 6.0000 | MEDICATED_PAD | Freq: Once | CUTANEOUS | Status: DC

## 2019-07-16 MED ORDER — KETAMINE HCL 10 MG/ML IJ SOLN
INTRAMUSCULAR | Status: DC | PRN
  Administered 2019-07-16 (×7): 10 mg via INTRAVENOUS

## 2019-07-16 MED ORDER — SODIUM CHLORIDE 0.9 % IV SOLN
INTRAVENOUS | Status: DC | PRN
  Administered 2019-07-16: 09:00:00 500 mL

## 2019-07-16 MED ORDER — SCOPOLAMINE 1 MG/3DAYS TD PT72
MEDICATED_PATCH | TRANSDERMAL | Status: DC | PRN
  Administered 2019-07-16: 1 via TRANSDERMAL

## 2019-07-16 MED ORDER — THROMBIN 5000 UNITS EX SOLR
CUTANEOUS | Status: AC
  Filled 2019-07-16: qty 10000

## 2019-07-16 MED ORDER — LACTATED RINGERS IV SOLN
INTRAVENOUS | Status: DC
  Administered 2019-07-16 – 2019-07-19 (×9): via INTRAVENOUS

## 2019-07-16 MED ORDER — LORATADINE 10 MG PO TABS
10.0000 mg | ORAL_TABLET | Freq: Every day | ORAL | Status: DC
  Filled 2019-07-16: qty 1

## 2019-07-16 MED ORDER — HYDROMORPHONE HCL 1 MG/ML IJ SOLN
INTRAMUSCULAR | Status: AC
  Filled 2019-07-16: qty 2

## 2019-07-16 MED ORDER — FAMOTIDINE 20 MG PO TABS
10.0000 mg | ORAL_TABLET | Freq: Every day | ORAL | Status: DC | PRN
  Administered 2019-07-18: 10 mg via ORAL
  Filled 2019-07-16 (×3): qty 1

## 2019-07-16 MED ORDER — SCOPOLAMINE 1 MG/3DAYS TD PT72
MEDICATED_PATCH | TRANSDERMAL | Status: AC
  Filled 2019-07-16: qty 1

## 2019-07-16 MED ORDER — DEXAMETHASONE SODIUM PHOSPHATE 10 MG/ML IJ SOLN
INTRAMUSCULAR | Status: DC | PRN
  Administered 2019-07-16: 10 mg via INTRAVENOUS

## 2019-07-16 MED ORDER — CHLORHEXIDINE GLUCONATE CLOTH 2 % EX PADS
6.0000 | MEDICATED_PAD | Freq: Every day | CUTANEOUS | Status: DC
  Administered 2019-07-18 – 2019-07-23 (×6): 6 via TOPICAL

## 2019-07-16 MED ORDER — OXYCODONE-ACETAMINOPHEN 5-325 MG PO TABS
1.0000 | ORAL_TABLET | ORAL | Status: DC | PRN
  Administered 2019-07-16 – 2019-07-17 (×2): 1 via ORAL
  Administered 2019-07-17 (×2): 2 via ORAL
  Administered 2019-07-17: 1 via ORAL
  Administered 2019-07-17 – 2019-07-18 (×2): 2 via ORAL
  Filled 2019-07-16 (×3): qty 2
  Filled 2019-07-16 (×2): qty 1
  Filled 2019-07-16: qty 2
  Filled 2019-07-16: qty 1
  Filled 2019-07-16: qty 2

## 2019-07-16 MED ORDER — SENNA 8.6 MG PO TABS
1.0000 | ORAL_TABLET | Freq: Two times a day (BID) | ORAL | Status: DC
  Administered 2019-07-16 – 2019-07-22 (×10): 8.6 mg via ORAL
  Filled 2019-07-16 (×11): qty 1

## 2019-07-16 MED ORDER — BUPIVACAINE HCL (PF) 0.5 % IJ SOLN
INTRAMUSCULAR | Status: DC | PRN
  Administered 2019-07-16: 10 mL

## 2019-07-16 MED ORDER — SODIUM CHLORIDE 0.9 % IV SOLN
INTRAVENOUS | Status: DC | PRN
  Administered 2019-07-16: 10 ug/min via INTRAVENOUS

## 2019-07-16 MED ORDER — ACETAMINOPHEN 500 MG PO TABS: 500.0000 mg | ORAL_TABLET | Freq: Four times a day (QID) | ORAL | Status: DC | PRN

## 2019-07-16 MED ORDER — ACETAMINOPHEN 500 MG PO TABS
1000.0000 mg | ORAL_TABLET | Freq: Once | ORAL | Status: AC
  Administered 2019-07-16: 1000 mg via ORAL

## 2019-07-16 MED ORDER — BISACODYL 10 MG RE SUPP
10.0000 mg | Freq: Every day | RECTAL | Status: DC | PRN
  Administered 2019-07-22: 10 mg via RECTAL
  Filled 2019-07-16: qty 1

## 2019-07-16 MED ORDER — 0.9 % SODIUM CHLORIDE (POUR BTL) OPTIME
TOPICAL | Status: DC | PRN
  Administered 2019-07-16: 09:00:00 1000 mL

## 2019-07-16 MED ORDER — ONDANSETRON HCL 4 MG/2ML IJ SOLN
INTRAMUSCULAR | Status: DC | PRN
  Administered 2019-07-16 (×2): 4 mg via INTRAVENOUS

## 2019-07-16 MED ORDER — LACTATED RINGERS IV SOLN
INTRAVENOUS | Status: DC | PRN
  Administered 2019-07-16 (×2): via INTRAVENOUS

## 2019-07-16 MED ORDER — MENTHOL 3 MG MT LOZG: 1.0000 | LOZENGE | OROMUCOSAL | Status: DC | PRN

## 2019-07-16 MED ORDER — BUPIVACAINE HCL (PF) 0.5 % IJ SOLN
INTRAMUSCULAR | Status: AC
  Filled 2019-07-16: qty 30

## 2019-07-16 MED ORDER — KETOROLAC TROMETHAMINE 15 MG/ML IJ SOLN
15.0000 mg | Freq: Four times a day (QID) | INTRAMUSCULAR | Status: AC
  Administered 2019-07-16 – 2019-07-17 (×3): 15 mg via INTRAVENOUS
  Filled 2019-07-16 (×2): qty 1

## 2019-07-16 MED ORDER — BUPIVACAINE HCL (PF) 0.25 % IJ SOLN
INTRAMUSCULAR | Status: DC | PRN
  Administered 2019-07-16: 30 mL

## 2019-07-16 MED ORDER — DOCUSATE SODIUM 100 MG PO CAPS
100.0000 mg | ORAL_CAPSULE | Freq: Two times a day (BID) | ORAL | Status: DC
  Administered 2019-07-16 – 2019-07-23 (×12): 100 mg via ORAL
  Filled 2019-07-16 (×13): qty 1

## 2019-07-16 MED ORDER — CALCIUM POLYCARBOPHIL 625 MG PO TABS
625.0000 mg | ORAL_TABLET | Freq: Every day | ORAL | Status: DC
  Administered 2019-07-17: 625 mg via ORAL
  Filled 2019-07-16 (×7): qty 1

## 2019-07-16 MED ORDER — VITAMIN B-12 1000 MCG PO TABS
1000.0000 ug | ORAL_TABLET | Freq: Every day | ORAL | Status: DC
  Filled 2019-07-16 (×6): qty 1

## 2019-07-16 MED ORDER — DIPHENHYDRAMINE HCL 50 MG/ML IJ SOLN
INTRAMUSCULAR | Status: DC | PRN
  Administered 2019-07-16: 12.5 mg via INTRAVENOUS

## 2019-07-16 MED ORDER — SUFENTANIL CITRATE 250 MCG/5ML IV SOLN
0.2500 ug/kg/h | INTRAVENOUS | Status: AC
  Administered 2019-07-16: 0.5 ug/kg/h via INTRAVENOUS
  Filled 2019-07-16: qty 5

## 2019-07-16 MED ORDER — SODIUM CHLORIDE 0.9 % IV SOLN
250.0000 mL | INTRAVENOUS | Status: DC
  Administered 2019-07-16: 250 mL via INTRAVENOUS

## 2019-07-16 MED ORDER — AMPHETAMINE-DEXTROAMPHET ER 10 MG PO CP24: 10.0000 mg | ORAL_CAPSULE | Freq: Every day | ORAL | Status: DC

## 2019-07-16 MED ORDER — PROPOFOL 10 MG/ML IV BOLUS
INTRAVENOUS | Status: AC
  Filled 2019-07-16: qty 20

## 2019-07-16 MED ORDER — ACETAMINOPHEN 650 MG RE SUPP: 650.0000 mg | RECTAL | Status: DC | PRN

## 2019-07-16 MED ORDER — ONDANSETRON HCL 4 MG/2ML IJ SOLN
4.0000 mg | Freq: Four times a day (QID) | INTRAMUSCULAR | Status: DC | PRN
  Administered 2019-07-16: 21:00:00 2 mg via INTRAVENOUS
  Administered 2019-07-17 – 2019-07-19 (×4): 4 mg via INTRAVENOUS
  Filled 2019-07-16 (×5): qty 2

## 2019-07-16 MED ORDER — MORPHINE SULFATE (PF) 2 MG/ML IV SOLN
2.0000 mg | INTRAVENOUS | Status: DC | PRN
  Administered 2019-07-17 (×4): 2 mg via INTRAVENOUS
  Administered 2019-07-18 (×2): 4 mg via INTRAVENOUS
  Administered 2019-07-18 (×3): 2 mg via INTRAVENOUS
  Administered 2019-07-18: 4 mg via INTRAVENOUS
  Administered 2019-07-19 – 2019-07-21 (×4): 2 mg via INTRAVENOUS
  Filled 2019-07-16: qty 2
  Filled 2019-07-16 (×8): qty 1
  Filled 2019-07-16 (×2): qty 2
  Filled 2019-07-16 (×5): qty 1

## 2019-07-16 MED ORDER — SODIUM CHLORIDE 0.9% FLUSH
3.0000 mL | Freq: Two times a day (BID) | INTRAVENOUS | Status: DC
  Administered 2019-07-16 – 2019-07-22 (×10): 3 mL via INTRAVENOUS

## 2019-07-16 MED ORDER — FENTANYL CITRATE (PF) 250 MCG/5ML IJ SOLN
INTRAMUSCULAR | Status: DC | PRN
  Administered 2019-07-16: 100 ug via INTRAVENOUS

## 2019-07-16 MED ORDER — KETOROLAC TROMETHAMINE 15 MG/ML IJ SOLN
INTRAMUSCULAR | Status: AC
  Filled 2019-07-16: qty 1

## 2019-07-16 MED ORDER — PHENOL 1.4 % MT LIQD: 1.0000 | OROMUCOSAL | Status: DC | PRN

## 2019-07-16 MED ORDER — ALPRAZOLAM 0.25 MG PO TABS
0.2500 mg | ORAL_TABLET | Freq: Every day | ORAL | Status: DC | PRN
  Administered 2019-07-23: 0.25 mg via ORAL
  Filled 2019-07-16 (×8): qty 1

## 2019-07-16 MED ORDER — LACTATED RINGERS IV SOLN
INTRAVENOUS | Status: DC | PRN
  Administered 2019-07-16 (×2): via INTRAVENOUS

## 2019-07-16 MED ORDER — LIDOCAINE 2% (20 MG/ML) 5 ML SYRINGE
INTRAMUSCULAR | Status: DC | PRN
  Administered 2019-07-16: 80 mg via INTRAVENOUS

## 2019-07-16 MED ORDER — FENTANYL CITRATE (PF) 250 MCG/5ML IJ SOLN
INTRAMUSCULAR | Status: AC
  Filled 2019-07-16: qty 5

## 2019-07-16 MED ORDER — METHOCARBAMOL 1000 MG/10ML IJ SOLN
500.0000 mg | Freq: Four times a day (QID) | INTRAVENOUS | Status: DC | PRN
  Administered 2019-07-16: 500 mg via INTRAVENOUS
  Filled 2019-07-16 (×2): qty 5

## 2019-07-16 MED ORDER — KETAMINE HCL 50 MG/5ML IJ SOSY
PREFILLED_SYRINGE | INTRAMUSCULAR | Status: AC
  Filled 2019-07-16: qty 10

## 2019-07-16 SURGICAL SUPPLY — 89 items
BAG DECANTER FOR FLEXI CONT (MISCELLANEOUS) ×3 IMPLANT
BASKET BONE COLLECTION (BASKET) ×3 IMPLANT
BIT DRILL LONG 3.0X30 (BIT) IMPLANT
BIT DRILL LONG 3X80 (BIT) IMPLANT
BIT DRILL LONG 4X80 (BIT) IMPLANT
BIT DRILL SHORT 3.0X30 (BIT) IMPLANT
BIT DRILL SHORT 3X80 (BIT) IMPLANT
BLADE CLIPPER SURG (BLADE) IMPLANT
BLADE SURG 10 STRL SS (BLADE) ×1 IMPLANT
BLADE SURG 11 STRL SS (BLADE) ×3 IMPLANT
BONE CANC CHIPS 40CC CAN1/2 (Bone Implant) ×3 IMPLANT
BUR MATCHSTICK NEURO 3.0 LAGG (BURR) ×3 IMPLANT
CANISTER SUCT 3000ML PPV (MISCELLANEOUS) ×3 IMPLANT
CHIPS CANC BONE 40CC CAN1/2 (Bone Implant) ×2 IMPLANT
CONT SPEC 4OZ CLIKSEAL STRL BL (MISCELLANEOUS) ×3 IMPLANT
COVER BACK TABLE 60X90IN (DRAPES) ×3 IMPLANT
COVER WAND RF STERILE (DRAPES) ×6 IMPLANT
DECANTER SPIKE VIAL GLASS SM (MISCELLANEOUS) ×3 IMPLANT
DERMABOND ADVANCED (GAUZE/BANDAGES/DRESSINGS) ×1
DERMABOND ADVANCED .7 DNX12 (GAUZE/BANDAGES/DRESSINGS) ×2 IMPLANT
DEVICE DISSECT PLASMABLAD 3.0S (MISCELLANEOUS) ×2 IMPLANT
DIGITIZER BENDINI (MISCELLANEOUS) ×1 IMPLANT
DRAPE C-ARM 42X72 X-RAY (DRAPES) ×6 IMPLANT
DRAPE HALF SHEET 40X57 (DRAPES) IMPLANT
DRAPE LAPAROTOMY 100X72X124 (DRAPES) ×3 IMPLANT
DRAPE SHEET LG 3/4 BI-LAMINATE (DRAPES) ×3 IMPLANT
DURAPREP 26ML APPLICATOR (WOUND CARE) ×3 IMPLANT
DURASEAL APPLICATOR TIP (TIP) IMPLANT
DURASEAL SPINE SEALANT 3ML (MISCELLANEOUS) IMPLANT
ELECT BLADE 4.0 EZ CLEAN MEGAD (MISCELLANEOUS)
ELECT REM PT RETURN 9FT ADLT (ELECTROSURGICAL) ×3
ELECTRODE BLDE 4.0 EZ CLN MEGD (MISCELLANEOUS) IMPLANT
ELECTRODE REM PT RTRN 9FT ADLT (ELECTROSURGICAL) ×2 IMPLANT
GAUZE 4X4 16PLY RFD (DISPOSABLE) ×2 IMPLANT
GAUZE SPONGE 4X4 12PLY STRL (GAUZE/BANDAGES/DRESSINGS) ×3 IMPLANT
GLOVE BIO SURGEON STRL SZ7 (GLOVE) ×2 IMPLANT
GLOVE BIOGEL PI IND STRL 7.0 (GLOVE) IMPLANT
GLOVE BIOGEL PI IND STRL 7.5 (GLOVE) IMPLANT
GLOVE BIOGEL PI IND STRL 8.5 (GLOVE) ×4 IMPLANT
GLOVE BIOGEL PI INDICATOR 7.0 (GLOVE) ×3
GLOVE BIOGEL PI INDICATOR 7.5 (GLOVE) ×1
GLOVE BIOGEL PI INDICATOR 8.5 (GLOVE) ×2
GLOVE ECLIPSE 8.5 STRL (GLOVE) ×8 IMPLANT
GLOVE SURG SS PI 7.0 STRL IVOR (GLOVE) ×4 IMPLANT
GOWN STRL REUS W/ TWL LRG LVL3 (GOWN DISPOSABLE) IMPLANT
GOWN STRL REUS W/ TWL XL LVL3 (GOWN DISPOSABLE) IMPLANT
GOWN STRL REUS W/TWL 2XL LVL3 (GOWN DISPOSABLE) ×6 IMPLANT
GOWN STRL REUS W/TWL LRG LVL3 (GOWN DISPOSABLE) ×4
GOWN STRL REUS W/TWL XL LVL3 (GOWN DISPOSABLE)
GRAFT BN 10X1XDBM MAGNIFUSE (Bone Implant) IMPLANT
GRAFT BNE CHIP CANC 1-8 40 (Bone Implant) IMPLANT
GRAFT BONE MAGNIFUSE 1X10CM (Bone Implant) ×1 IMPLANT
GUIDEWIRE NITINOL BEVEL TIP (WIRE) ×21 IMPLANT
HEMOSTAT POWDER KIT SURGIFOAM (HEMOSTASIS) ×5 IMPLANT
KIT BASIN OR (CUSTOM PROCEDURE TRAY) ×3 IMPLANT
KIT SPINE MAZOR X ROBO DISP (MISCELLANEOUS) ×3 IMPLANT
KIT TURNOVER KIT B (KITS) ×3 IMPLANT
MILL MEDIUM DISP (BLADE) ×3 IMPLANT
NEEDLE HYPO 22GX1.5 SAFETY (NEEDLE) ×3 IMPLANT
NS IRRIG 1000ML POUR BTL (IV SOLUTION) ×3 IMPLANT
PACK LAMINECTOMY NEURO (CUSTOM PROCEDURE TRAY) ×3 IMPLANT
PATTIES SURGICAL .5 X1 (DISPOSABLE) ×3 IMPLANT
PIN HEAD 2.5X60MM (PIN) IMPLANT
PLASMABLADE 3.0S (MISCELLANEOUS) ×3
ROD RELINE 5.5X500MM STRAIGHT (Rod) ×2 IMPLANT
SCREW LOCK RELINE 5.5 TULIP (Screw) ×20 IMPLANT
SCREW RELINE MAS MOD 4.5X45MM (Screw) ×9 IMPLANT
SCREW RELINE MAS POLY 5.5X45MM (Screw) ×4 IMPLANT
SCREW SCHANZ SA 4.0MM (MISCELLANEOUS) IMPLANT
SCREW SHANK RELINE 4.5X35MM 2C (Screw) ×2 IMPLANT
SCREW SHANK RELINE 4.5X40MM 2C (Screw) ×6 IMPLANT
SPINE TULIP RELINE MOD (Neuro Prosthesis/Implant) ×48 IMPLANT
SPONGE LAP 18X18 RF (DISPOSABLE) ×1 IMPLANT
SPONGE LAP 4X18 RFD (DISPOSABLE) ×1 IMPLANT
SPONGE NEURO XRAY DETECT 1X3 (DISPOSABLE) ×1 IMPLANT
SPONGE SURGIFOAM ABS GEL 100 (HEMOSTASIS) ×2 IMPLANT
SUT PROLENE 6 0 BV (SUTURE) IMPLANT
SUT VIC AB 1 CT1 18XBRD ANBCTR (SUTURE) ×2 IMPLANT
SUT VIC AB 1 CT1 8-18 (SUTURE) ×3
SUT VIC AB 2-0 CP2 18 (SUTURE) ×4 IMPLANT
SUT VIC AB 3-0 SH 8-18 (SUTURE) ×7 IMPLANT
SYR 3ML LL SCALE MARK (SYRINGE) ×12 IMPLANT
TAPE CLOTH SURG 4X10 WHT LF (GAUZE/BANDAGES/DRESSINGS) ×1 IMPLANT
TOWEL GREEN STERILE (TOWEL DISPOSABLE) ×3 IMPLANT
TOWEL GREEN STERILE FF (TOWEL DISPOSABLE) ×3 IMPLANT
TRAY FOLEY MTR SLVR 16FR STAT (SET/KITS/TRAYS/PACK) ×3 IMPLANT
TUBE MAZOR SA REDUCTION (TUBING) ×3 IMPLANT
TULIP SPINE RELINE MOD (Neuro Prosthesis/Implant) IMPLANT
WATER STERILE IRR 1000ML POUR (IV SOLUTION) ×3 IMPLANT

## 2019-07-16 NOTE — Progress Notes (Signed)
Spoke to neurosurgery NP Reinaldo Meeker about patient being hypotensive and bradycardic. New orders received for a normal saline bolus 500 mL to be given over 30 minutes. Will continue to monitor.

## 2019-07-16 NOTE — Anesthesia Procedure Notes (Signed)
Arterial Line Insertion Start/End8/02/2019 7:15 AM Performed by: Alain Marion, CRNA, CRNA  Preanesthetic checklist: patient identified, IV checked, site marked, risks and benefits discussed, surgical consent, monitors and equipment checked, pre-op evaluation, timeout performed and anesthesia consent Lidocaine 1% used for infiltration Left, radial was placed Catheter size: 20 G Hand hygiene performed  and maximum sterile barriers used   Attempts: 1 Procedure performed without using ultrasound guided technique. Following insertion, dressing applied and Biopatch. Post procedure assessment: normal  Patient tolerated the procedure well with no immediate complications.

## 2019-07-16 NOTE — Progress Notes (Signed)
Pt belongings include:  [employer] (1 pair eyeglasses, 1 pair sunglasses) Cell phone [employer] (4 credit cards, $96 - one $2 bill, four $1 bills, two $5 bills, four $20 bills) Glass blower/designer

## 2019-07-16 NOTE — H&P (Signed)
CHIEF COMPLAINT: Neck and facial pain with headaches, worsened with flexed forward position.  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  He notes that overall, he was tolerating things fairly well, but over the years he has developed several unusual symptoms.  He had a symptom diagnosed as interstitial cystitis here and then he has had episodes of back pain.  More recently, he has had episodes of some neck pain and facial pain, particularly posturally related.  He notes that if he was flexed forward, he would tend to get significant and quite severe headaches.  He would have to maintain an upright posture.  He notes that in order to work, he had to stand most of the time as sitting would tend to exacerbate a bit of flexion in his neck, which would bring on the neck and head pain.  He was ultimately worked up with an MRI of the cervical spine and an MRI of the thoracic spine.  It was noted that he had some degenerative disc processes at C5 and C6.  He was seen by Dr. [name] for evaluation of his Scheuermann kyphosis and he was also seen by Dr. Patrica Duel for some spinal treatments.  He recently underwent a facet rhizotomy on the right side.  He notes that this did help the neck pain to a considerable degree.  He notes that he is now at least able to sit for brief periods of time.  It was noted on his plain x-rays that he had obtained in Dr. Towanda Malkin office that he had a kyphosis measuring in the mid 70s degree wise.     Today in the office, we obtained an AP and lateral thoracic spine film with flexion-extension effort and we note that in the neutral posture, he has a kyphosis of 79 degrees as measured from the T3 to T9.  When he extends as best possible, this reduces to 62 degrees.  Plain x-rays of his lumbar spine were also viewed from Dr. Towanda Malkin office and these revealed a normal  lumbar lordosis and essentially normal cervical lordosis with a slight bit of straightening.  The MRI of the cervical spine shows a broad-based disc bulge at C5-6, but no evidence of any foraminal stenosis.  He was told that he had significant facet arthropathy, thus prompting the facet injections.  PAST MEDICAL HISTORY: Otherwise notes that he has been on Gabapentin 300 mg 4 times a day for the nerve pain.  This has helped to subdue that.  He also uses Adderall for adult ADHD 10 mg daily.  SYSTEMS REVIEW: Notable for the neck pain, arm weakness, arm pain, some anxiety and depression.  PHYSICAL EXAMINATION: I note that he stands straight and erect.  He does have a notable kyphotic gibbus in the mid thoracic spine, but neutrally appearing on his posture, his neck is centered nicely and he does not appear to have a hyperlordosis that is apparent.  His motor function as noted by his station and gait is within the limits of normal.  IMPRESSION: The patient has evidence of a significant Scheuermann kyphosis in the mid thoracic spine. Given the postural correction that is possible, I believe that ultimately he needs to have his Scheuermann kyphosis corrected with a stabilization procedure fromT-4 to L-2  This would hopefully reduce his kyphosis to more normal range in the area of 40 degrees.  I have  also advised that he should undergo a CT scan of thoracic spine.  This would be for compatibility with robotic placement of the pedicle screws in the thoracic spine.  I also advised that some rudimentary autoimmune inflammatory workup should be performed such as a CBC, sedimentation rate, rheumatoid factor, and an ANA to see if there is some process going on in addition to the Scheuermann kyphosis that could explain his constellation of symptoms, which have to this point included significant pain in a nondermatomal pattern in addition to a bout of interstitial cystitis that was apparent.  We will see how he does  once we collect all this data and I discussed the surgical intervention with him today and will make further plans as we proceed.

## 2019-07-16 NOTE — Op Note (Signed)
Date of surgery: 07/16/2019 Preoperative diagnosis: Scheurmans kyphosis Postoperative diagnosis: Same Procedure: Kyphectomy at T7-T8 with pedicular subtraction osteotomy, segmental fixation from T4-L2 with robotic assistance placement of pedicle screws T4-L2 and posterior arthrodesis using autograft and allograft T4-L2 Surgeon: Kristeen Miss First Assistant: Deri Fuelling, MD Anesthesia: General endotracheal Indications: Bernard Dalton is a 38 year old individual who has had Shermans disease with increasing back pain for a number of years.  He is developed a significant kyphotic deformity with a kyphotic angulation from T4-T12 of 79 degrees.  This causes hyperlordosis in the lower lumbar spine.  After careful evaluation preoperatively it was decided that he would do well with a nickel subtraction osteotomy above a fused segment T9 and T10 and stabilization from T4-L2.  Procedure: The patient was brought to the operating room supine on the stretcher.  After the smooth induction of general endotracheal anesthesia he was carefully turned prone on a Jackson table.  The bony prominences were appropriately padded and protected.  The back was prepped with alcohol DuraPrep and draped in a sterile fashion the procedure was started over the thoracic hump which was believed to be centered at approximately T8.  Vertical incision was created in this region carried down to the thoracodorsal fascia which was opened on either side of midline the fascia was released from the spinous processes at T8-T9 and then by dissecting cephalad we exposed all the way up to T4.  At T9-T10 the dorsal fusion was noted and no motion could be instilled in this region at T8 and T7 we could note some motion.  Once the dissection was complete in a subperiosteal fashion the robotic arm was placed onto the patient and attached to the T8 vertebrae.  Then by registering the robot to the preoperative scan the robotic arm could choose pedicle entry sites at  T4-T5 T6-T7-T8 and T9.  On the left side at T8 a pedicle entry site could not be found because of the arm attachment.  K wires were passed into each of the trajectories and individually pedicle screws were placed at T4 4.5 x 35 mm screws were placed at [facility] 5 T6 and T7 4.5 x 40 mm screws were placed at T8-9 4.5 x 45 mm screws were placed as they were at T10-T11 and T12.  At L1 5.5 x 45 mm screws were placed as they were at L2.  Once all the screws were placed without caps being attached first then the process of the pedicle subtraction osteotomy was undertaken.  First a laminectomy was created at T8 and T7 inferior spinous process and laminar arch was partially removed.  Yellow ligament was taken up and the underlying dural sac was identified.  The spinal canal was identified.  Lateral aspect of the disc space was identified.  Pedicle was isolated.  The transverse process and the pedicle was then removed en bloc and the underlying rib for the T8 vertebrae was identified.  On the left side the rib head was carefully resected and on the left side the rib was exposed and by carefully doing a sub-3 ostial dissection a portion of the rib was isolated approximately 5 cm distal from the rib pad.  The rib was cut in this region and then the rib itself was removed en bloc on the right side.  On the left side the rib head was removed and then a portion was cut back for a distance of 5 cm.  Care was taken not to enter the pleura and this appeared  to be performed bilaterally.  With this care was taken to isolate the lateral aspect of the vertebrae itself a discectomy was performed at T7 and T8 and this was done bilaterally to loosen the superior vertebrae from the inferior vertebrae.  Then an area below the pedicle was chosen to create the osteotomy and this was first done on the right side and the cavity was formed in the vertebral body to allow transection of the inferior portion of the vertebral body and remove it in an en  bloc fashion from the left side.  Once this was performed a trial of compression was performed to see how much reduction would be obtained.  Several trials were required to obtain the right amount of reduction to allow for the 25 degrees of correction that was being sought to reduce his kyphosis.  Once were able to achieve this reduction without too much force on the vertebrae we then applied bilateral clamps to keep the vertebrae closing in line and we used the Bandini to select rod placement after placing the screw heads on all the screws on the right side from T4 down to L2.  The rod was cut and bent at 325 mm on the right side this was placed in a neutral construct but then once the screw caps were attached a compression construct was placed across T7-T9.  The left-sided rod was then similarly cut at 330 mm and placed with compression between T7 and T9.  The rod was then tightened down all the positions and the posterior interspinous spaces were decorticated from T4 all the way down to L2.  Autograft was mixed with 40 cc of cancellus bone chips and then placed into the posterior gutters along the interspinous space from T4 down to L2.  Cancellus bone and a resorbable bag was then placed posteriorly from T7-T9.  Once all the graft was laid in place hemostasis was checked in the soft tissues and the thoracic dorsal tissue was then closed with the fascia being reapproximated with #1 Vicryl in interrupted fashion 2-0 Vicryl was used in the subcutaneous tissues and all the incisions and 3-0 Vicryl was used subcuticularly.  Should be noted that the L1 and L2 screws were placed percutaneously separate small lateral incisions and these were connected to the rods using the percutaneous towers.  Final radiographs were obtained in the operating room but ultimately standing radiographs will give the final determination of the degree and stability of the correction..  Blood loss was estimated at 1800 cc, 650 cc of Cell Saver  blood was returned to the patient

## 2019-07-16 NOTE — Anesthesia Procedure Notes (Signed)
Procedure Name: Intubation Date/Time: 07/16/2019 7:49 AM Performed by: Alain Marion, CRNA Pre-anesthesia Checklist: Patient identified, Emergency Drugs available, Suction available and Patient being monitored Patient Re-evaluated:Patient Re-evaluated prior to induction Oxygen Delivery Method: Circle System Utilized Preoxygenation: Pre-oxygenation with 100% oxygen Induction Type: IV induction Ventilation: Mask ventilation without difficulty Laryngoscope Size: Miller and 2 Grade View: Grade I Tube type: Oral Tube size: 7.5 mm Number of attempts: 1 Airway Equipment and Method: Stylet and Oral airway Placement Confirmation: ETT inserted through vocal cords under direct vision,  positive ETCO2 and breath sounds checked- equal and bilateral Secured at: 22 cm Tube secured with: Tape Dental Injury: Teeth and Oropharynx as per pre-operative assessment

## 2019-07-16 NOTE — Transfer of Care (Signed)
Immediate Anesthesia Transfer of Care Note  Patient: Bernard Dalton  Procedure(s) Performed: Thoracic Four to Lumbar Two posterior fusion, Kyphectomy with osteotomies, segmental pedicle screw fixation (N/A Back) APPLICATION OF ROBOTIC ASSISTANCE FOR SPINAL PROCEDURE (N/A )  Patient Location: PACU  Anesthesia Type:General  Level of Consciousness: drowsy and patient cooperative  Airway & Oxygen Therapy: Patient Spontanous Breathing and Patient connected to face mask oxygen  Post-op Assessment: Report given to RN, Post -op Vital signs reviewed and stable and Patient moving all extremities  Post vital signs: Reviewed and stable  Last Vitals:  Vitals Value Taken Time  BP 96/66 07/16/19 1710  Temp    Pulse 64 07/16/19 1710  Resp 11 07/16/19 1710  SpO2 100 % 07/16/19 1710  Vitals shown include unvalidated device data.  Last Pain:  Vitals:   07/16/19 0555  TempSrc:   PainSc: 1       Patients Stated Pain Goal: 2 (16/10/96 0454)  Complications: No apparent anesthesia complications

## 2019-07-16 NOTE — Progress Notes (Signed)
Patient ID: Bernard Dalton, male   DOB: [birth year], 38 y.o.   MRN: [id number] All signs are stable Patient arouses nicely to voice Follows commands and moves both his lower extremities reasonably well Seems comfortable in the post anesthesia care unit I have spoken to his wife and noted that he is doing reasonably well postoperatively.

## 2019-07-17 ENCOUNTER — Encounter (HOSPITAL_COMMUNITY): Payer: Self-pay | Admitting: Neurological Surgery

## 2019-07-17 ENCOUNTER — Inpatient Hospital Stay (HOSPITAL_COMMUNITY): Payer: BC Managed Care – PPO

## 2019-07-17 LAB — CBC
HCT: 24.8 % — ABNORMAL LOW (ref 39.0–52.0)
Hemoglobin: 8.7 g/dL — ABNORMAL LOW (ref 13.0–17.0)
MCH: 31.8 pg (ref 26.0–34.0)
MCHC: 35.1 g/dL (ref 30.0–36.0)
MCV: 90.5 fL (ref 80.0–100.0)
Platelets: 120 10*3/uL — ABNORMAL LOW (ref 150–400)
RBC: 2.74 MIL/uL — ABNORMAL LOW (ref 4.22–5.81)
RDW: 12.8 % (ref 11.5–15.5)
WBC: 11.3 10*3/uL — ABNORMAL HIGH (ref 4.0–10.5)
nRBC: 0 % (ref 0.0–0.2)

## 2019-07-17 LAB — BASIC METABOLIC PANEL
Anion gap: 8 (ref 5–15)
BUN: 16 mg/dL (ref 6–20)
CO2: 24 mmol/L (ref 22–32)
Calcium: 7.5 mg/dL — ABNORMAL LOW (ref 8.9–10.3)
Chloride: 105 mmol/L (ref 98–111)
Creatinine, Ser: 0.99 mg/dL (ref 0.61–1.24)
GFR calc Af Amer: >60 mL/min (ref >60)
GFR calc non Af Amer: >60 mL/min (ref >60)
Glucose, Bld: 145 mg/dL — ABNORMAL HIGH (ref 70–99)
Potassium: 4.2 mmol/L (ref 3.5–5.1)
Sodium: 137 mmol/L (ref 135–145)

## 2019-07-17 MED ORDER — SODIUM CHLORIDE 0.9 % IV BOLUS
500.0000 mL | Freq: Once | INTRAVENOUS | Status: AC
  Administered 2019-07-17: 500 mL via INTRAVENOUS

## 2019-07-17 MED ORDER — DEXAMETHASONE SODIUM PHOSPHATE 10 MG/ML IJ SOLN
6.0000 mg | Freq: Once | INTRAMUSCULAR | Status: AC
  Administered 2019-07-17: 6 mg via INTRAVENOUS
  Filled 2019-07-17: qty 1

## 2019-07-17 MED ORDER — PROMETHAZINE HCL 25 MG/ML IJ SOLN
12.5000 mg | Freq: Four times a day (QID) | INTRAMUSCULAR | Status: DC | PRN
  Administered 2019-07-18 – 2019-07-21 (×3): 12.5 mg via INTRAVENOUS
  Filled 2019-07-17 (×4): qty 1

## 2019-07-17 MED FILL — Gelatin Absorbable MT Powder: OROMUCOSAL | Qty: 1 | Status: AC

## 2019-07-17 MED FILL — Heparin Sodium (Porcine) Inj 1000 Unit/ML: INTRAMUSCULAR | Qty: 30 | Status: AC

## 2019-07-17 MED FILL — Thrombin For Soln 5000 Unit: CUTANEOUS | Qty: 2 | Status: AC

## 2019-07-17 MED FILL — Sodium Chloride IV Soln 0.9%: INTRAVENOUS | Qty: 3000 | Status: AC

## 2019-07-17 NOTE — Progress Notes (Signed)
Patient ID: Bernard Dalton, male   DOB: Apr 25, 1981, 38 y.o.   MRN: [id number] Vital signs stable Post op hemoglobin down to 8 gms from 15 pre op.secondary to ABLA Post op chest xray ok without pneumothorax Dressing dry Jessy does complain of shortness of breath likely due to pso Continue supportive care in icu recheck hgb in am/

## 2019-07-17 NOTE — Anesthesia Postprocedure Evaluation (Signed)
Anesthesia Post Note  Patient: Esli Jernigan  Procedure(s) Performed: Thoracic Four to Lumbar Two posterior fusion, Kyphectomy with osteotomies, segmental pedicle screw fixation (N/A Back) APPLICATION OF ROBOTIC ASSISTANCE FOR SPINAL PROCEDURE (N/A )     Patient location during evaluation: PACU Anesthesia Type: General Level of consciousness: awake and alert Pain management: pain level controlled Vital Signs Assessment: post-procedure vital signs reviewed and stable Respiratory status: spontaneous breathing, nonlabored ventilation, respiratory function stable and patient connected to nasal cannula oxygen Cardiovascular status: blood pressure returned to baseline and stable Postop Assessment: no apparent nausea or vomiting Anesthetic complications: no    Last Vitals:  Vitals:   07/17/19 0800 07/17/19 0900  BP:  (!) 90/53  Pulse:  (!) 57  Resp:  (!) 24  Temp: 36.5 C   SpO2:  99%    Last Pain:  Vitals:   07/17/19 0800  TempSrc: Oral  PainSc:                  Tiajuana Amass

## 2019-07-17 NOTE — Evaluation (Addendum)
Occupational Therapy Evaluation Patient Details Name: Bernard Dalton MRN: [id number] DOB: 06/30/1981 Today's Date: 07/17/2019    History of Present Illness Pt is a 38 y/o male with diagnosis of Scheuermann kyphosis. Pt is now s/p Kyphectomy at T7-T8 with pedicular subtraction osteotomy, segmental fixation from T4-L2 with placement of pedicle screws, and posterior arthrodesis using autograft and allograft T4-L2 on 07/16/19. Additional PMHx includes anxiety, depression, ADD, DDD, headache syndrome    Clinical Impression   This 38 y/o male presents with the above. PTA pt reports independence with ADL, iADL and functional mobility, lives at home with spouse. Pt willing to participate in therapy session but limited due to pain this session. He tolerated bed mobility with two person assist for transfer completion, however was only able to tolerate sitting EOB for brief period of time (<70min) and required return to supine due to significant increase in pain levels. Pt also with increased dizziness sitting upright, BP monitored, see below. Pt currently requires at least modA for UB ADL, totalA for LB ADL. He will benefit from continued acute OT services and given current deficits/need for assist recommend post acute rehab services prior to return home to maximize his safety and independence with ADL and mobility. Anticipate pt will progress well once pain levels begin to decrease. Will follow.   BP start of session: 102/53 Seated EOB: 90/57    Follow Up Recommendations  CIR;Supervision/Assistance - 24 hour(pending progress)    Equipment Recommendations  3 in 1 bedside commode;Other (comment)(TBD in next venue)    Recommendations for Other Services Rehab consult     Precautions / Restrictions Precautions Precautions: Fall;Back Precaution Booklet Issued: No Precaution Comments: verbally reviewed Required Braces or Orthoses: Spinal Brace Spinal Brace: Thoracolumbosacral orthotic;Applied in sitting  position Restrictions Weight Bearing Restrictions: No      Mobility Bed Mobility Overal bed mobility: Needs Assistance Bed Mobility: Rolling;Sidelying to Sit;Sit to Sidelying Rolling: Min assist;+2 for physical assistance;+2 for safety/equipment Sidelying to sit: Min assist;+2 for physical assistance;+2 for safety/equipment     Sit to sidelying: Min assist;+2 for physical assistance;+2 for safety/equipment General bed mobility comments: VCs for use of log roll technique; assist to roll using bedpad to support hips; assist for trunk elevation to sitting, for bil LEs when returning to sidelying; pt reports + dizziness with upright, limited due to pain requiring increased time/effort; pt with limited tolerance to upright due to pain (<5 min) requiring return to supine   Transfers                 General transfer comment: unable due to pain     Balance Overall balance assessment: Needs assistance Sitting-balance support: Feet supported;Single extremity supported;Bilateral upper extremity supported Sitting balance-Leahy Scale: Fair Sitting balance - Comments: pt able to maintain static balance with minguard assist                                   ADL either performed or assessed with clinical judgement   ADL Overall ADL's : Needs assistance/impaired Eating/Feeding: Modified independent;Sitting;Bed level   Grooming: Set up;Min guard;Bed level   Upper Body Bathing: Bed level;Moderate assistance   Lower Body Bathing: Total assistance   Upper Body Dressing : Moderate assistance;Maximal assistance;Bed level   Lower Body Dressing: Total assistance       Toileting- Clothing Manipulation and Hygiene: Total assistance       Functional mobility during ADLs: Minimal assistance;+2 for physical  assistance;+2 for safety/equipment(bed mobility only) General ADL Comments: pt with limitations mostly due to pain at this time                          Pertinent Vitals/Pain Pain Assessment: Faces Pain Score: 5 ("4.5"; with sitting upright pain increased significantly ) Faces Pain Scale: Hurts whole lot(sitting EOB) Pain Location: diaphragm, chest, incisional Pain Descriptors / Indicators: Discomfort;Crying;Grimacing;Guarding;Sore;Sharp Pain Intervention(s): Limited activity within patient's tolerance;Monitored during session;Repositioned;Patient requesting pain meds-RN notified     Hand Dominance Right   Extremity/Trunk Assessment Upper Extremity Assessment Upper Extremity Assessment: Generalized weakness(reports intermittent numbness bil hands)   Lower Extremity Assessment Lower Extremity Assessment: Defer to PT evaluation   Cervical / Trunk Assessment Cervical / Trunk Assessment: Other exceptions Cervical / Trunk Exceptions: s/p spinal surgery   Communication Communication Communication: No difficulties   Cognition Arousal/Alertness: Awake/alert Behavior During Therapy: WFL for tasks assessed/performed Overall Cognitive Status: Within Functional Limits for tasks assessed                                     General Comments       Exercises     Shoulder Instructions      Home Living Family/patient expects to be discharged to:: Private residence Living Arrangements: Spouse/significant other;Children Available Help at Discharge: Family;Available 24 hours/day Type of Home: House Home Access: Stairs to enter [employer] of Steps: 1   Home Layout: One level     Bathroom Shower/Tub: Walk-in shower;Tub/shower unit   Bathroom Toilet: Standard     Home Equipment: None          Prior Functioning/Environment Level of Independence: Independent        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[employer], hospital Problem List: Decreased strength;Decreased range of motion;Decreased activity tolerance;Impaired balance (sitting and/or standing);Decreased safety awareness;Decreased knowledge of  use of DME or AE;Decreased knowledge of precautions;Pain      OT Treatment/Interventions: Self-care/ADL training;Therapeutic exercise;Neuromuscular education;Energy conservation;DME and/or AE instruction;Therapeutic activities;Patient/family education;Balance training    OT Goals(Current goals can be found in the care plan section) Acute Rehab OT Goals Patient Stated Goal: less pain OT Goal Formulation: With patient Time For Goal Achievement: 07/31/19 Potential to Achieve Goals: Good  OT Frequency: Min 2X/week   Barriers to D/C:            Co-evaluation PT/OT/SLP Co-Evaluation/Treatment: Yes Reason for Co-Treatment: For patient/therapist safety;To address functional/ADL transfers;Complexity of the patient's impairments (multi-system involvement) PT goals addressed during session: Mobility/safety with mobility OT goals addressed during session: ADL's and self-care      AM-PAC OT "6 Clicks" Daily Activity     Outcome Measure Help from another person eating meals?: None Help from another person taking care of personal grooming?: A Little Help from another person toileting, which includes using toliet, bedpan, or urinal?: Total Help from another person bathing (including washing, rinsing, drying)?: A Lot Help from another person to put on and taking off regular upper body clothing?: A Lot Help from another person to put on and taking off regular lower body clothing?: Total 6 Click Score: 13   End of Session Nurse Communication: Mobility status  Activity Tolerance: Patient tolerated treatment well;Patient limited by pain Patient left: in bed;with call bell/phone within reach;with nursing/sitter in room  OT Visit Diagnosis: Other abnormalities of gait and mobility (R26.89);Pain Pain -  part of body: (back)                Time: 4680-3212 OT Time Calculation (min): 26 min Charges:  OT General Charges $OT Visit: 1 Visit OT Evaluation $OT Eval Moderate Complexity: 1 Mod  Lou Cal, OT [employer] Pager [phone] Office [place] 07/17/2019, 9:47 AM

## 2019-07-17 NOTE — Progress Notes (Signed)
Neurosurgery NP Reinaldo Meeker made aware that patient is complaining of slight chest pain, in addition to rib and back pain. PRN pain medication given. NP said that this is most likely d/t the surgery. NP said that if pain is unrelieved by PRN pain medication, to then do a 12 lead ECG and draw troponin labs. Will continue to monitor.

## 2019-07-17 NOTE — Evaluation (Signed)
Physical Therapy Evaluation Patient Details Name: Bernard Dalton MRN: [id number] DOB: [birth year] Today's Date: 07/17/2019   History of Present Illness  Pt is a 38 y/o male with diagnosis of Scheuermann kyphosis. Pt is now s/p Kyphectomy at T7-T8 with pedicular subtraction osteotomy, segmental fixation from T4-L2 with placement of pedicle screws, and posterior arthrodesis using autograft and allograft T4-L2 on 07/16/19. Additional PMHx includes anxiety, depression, ADD, DDD, headache syndrome   Clinical Impression  Patient presents with pain and post surgical deficits s/p above surgery. Pt independent PTA and works with computers. Lives with wife who can provide support at d/c. Mobility assessment limited today secondary to dizziness and pain in diaphragm worsened with sitting EOB and burping. Education re: back precautions, log roll technique, brace, bracing. Requires Min A for bed mobility. Only tolerated sitting EOB ~5 minutes due to pain. RN present. Might need CIR pending progress with mobility and pain control. Will follow acutely to maximize independence and mobility prior to return home.     Follow Up Recommendations Supervision for mobility/OOB;CIR    Equipment Recommendations  Other (comment)    Recommendations for Other Services Rehab consult     Precautions / Restrictions Precautions Precautions: Fall;Back Precaution Booklet Issued: No Precaution Comments: verbally reviewed Required Braces or Orthoses: Spinal Brace Spinal Brace: Thoracolumbosacral orthotic;Applied in sitting position Restrictions Weight Bearing Restrictions: No      Mobility  Bed Mobility Overal bed mobility: Needs Assistance Bed Mobility: Rolling;Sidelying to Sit;Sit to Sidelying Rolling: Min assist;+2 for physical assistance;+2 for safety/equipment Sidelying to sit: Min assist;+2 for physical assistance;+2 for safety/equipment     Sit to sidelying: Min assist;+2 for physical assistance;+2 for  safety/equipment General bed mobility comments: VCs for use of log roll technique; assist to roll using bedpad to support hips; assist for trunk elevation to sitting, for bil LEs when returning to sidelying; pt reports + dizziness with upright, limited due to pain requiring increased time/effort; pt with limited tolerance to upright due to pain (<5 min) requiring return to supine   Transfers                 General transfer comment: unable due to pain   Ambulation/Gait                Stairs            Wheelchair Mobility    Modified Rankin (Stroke Patients Only)       Balance Overall balance assessment: Needs assistance Sitting-balance support: Feet supported;Single extremity supported;Bilateral upper extremity supported Sitting balance-Leahy Scale: Fair Sitting balance - Comments: pt able to maintain static balance with minguard assist                                     Pertinent Vitals/Pain Pain Assessment: 0-10 Pain Score: (4.5) Faces Pain Scale: Hurts whole lot(sitting EOB) Pain Location: diaphragm, chest, incisional Pain Descriptors / Indicators: Discomfort;Crying;Grimacing;Guarding;Sore;Sharp Pain Intervention(s): Repositioned;Monitored during session;RN gave pain meds during session;Limited activity within patient's tolerance    Home Living Family/patient expects to be discharged to:: Private residence Living Arrangements: Spouse/significant other;Children Available Help at Discharge: Family;Available 24 hours/day Type of Home: House Home Access: Stairs to enter   [employer] of Steps: 1 Home Layout: One level Home Equipment: None      Prior Function Level of Independence: Independent         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Engineer, structural  Hand Dominance   Dominant Hand: Right    Extremity/Trunk Assessment   Upper Extremity Assessment Upper Extremity Assessment: Defer to OT evaluation    Lower Extremity  Assessment Lower Extremity Assessment: Generalized weakness    Cervical / Trunk Assessment Cervical / Trunk Assessment: Other exceptions Cervical / Trunk Exceptions: s/p spinal surgery  Communication   Communication: No difficulties  Cognition Arousal/Alertness: Awake/alert Behavior During Therapy: WFL for tasks assessed/performed Overall Cognitive Status: Within Functional Limits for tasks assessed                                        General Comments General comments (skin integrity, edema, etc.): Supine BP 102/53, sitting BP 90/57    Exercises     Assessment/Plan    PT Assessment Patient needs continued PT services  PT Problem List Decreased strength;Decreased mobility;Pain;Impaired sensation;Decreased knowledge of use of DME;Cardiopulmonary status limiting activity;Decreased skin integrity;Decreased activity tolerance;Decreased knowledge of precautions       PT Treatment Interventions Therapeutic activities;Gait training;Therapeutic exercise;Patient/family education;Balance training;Functional mobility training;DME instruction;Stair training    PT Goals (Current goals can be found in the Care Plan section)  Acute Rehab PT Goals Patient Stated Goal: less pain PT Goal Formulation: With patient Time For Goal Achievement: 07/31/19 Potential to Achieve Goals: Good    Frequency Min 5X/week   Barriers to discharge        Co-evaluation PT/OT/SLP Co-Evaluation/Treatment: Yes Reason for Co-Treatment: For patient/therapist safety;To address functional/ADL transfers;Complexity of the patient's impairments (multi-system involvement) PT goals addressed during session: Mobility/safety with mobility OT goals addressed during session: ADL's and self-care       AM-PAC PT "6 Clicks" Mobility  Outcome Measure Help needed turning from your back to your side while in a flat bed without using bedrails?: A Little Help needed moving from lying on your back to  sitting on the side of a flat bed without using bedrails?: A Little Help needed moving to and from a bed to a chair (including a wheelchair)?: A Little Help needed standing up from a chair using your arms (e.g., wheelchair or bedside chair)?: A Little Help needed to walk in hospital room?: A Little Help needed climbing 3-5 steps with a railing? : A Lot 6 Click Score: 17    End of Session   Activity Tolerance: Patient limited by pain Patient left: in bed;with call bell/phone within reach;with bed alarm set;with nursing/sitter in room Nurse Communication: Mobility status PT Visit Diagnosis: Pain;Dizziness and giddiness (R42);Muscle weakness (generalized) (M62.81) Pain - part of body: (diaphragm, chest, back)    Time: 1610-9604 PT Time Calculation (min) (ACUTE ONLY): 26 min   Charges:   PT Evaluation $PT Eval Moderate Complexity: 1 Mod          Wray Kearns, PT, DPT Acute Rehabilitation Services Pager [phone] Office [place] 07/17/2019, 11:39 AM

## 2019-07-17 NOTE — Progress Notes (Signed)
Paged Neurosurgery NP Reinaldo Meeker about patient's hypotension. Patient is asymptomatic. Was told from NP to keep assessing, and if patient becomes symptomatic or BP continues to drop, to do another 500 mL normal saline bolus over 30 minutes. Will continue to monitor.

## 2019-07-17 NOTE — Progress Notes (Signed)
Rehab Admissions Coordinator Note:  Per OT recommendation, this patient was screened by Jhonnie Garner for appropriateness for an Inpatient Acute Rehab Consult.  At this time, we are recommending Inpatient Rehab consult. AC will contact MD to request order.   Jhonnie Garner 07/17/2019, 1:13 PM  I can be reached at [phone].

## 2019-07-18 ENCOUNTER — Inpatient Hospital Stay (HOSPITAL_COMMUNITY): Payer: BC Managed Care – PPO

## 2019-07-18 ENCOUNTER — Encounter (HOSPITAL_COMMUNITY): Payer: Self-pay | Admitting: Neurological Surgery

## 2019-07-18 DIAGNOSIS — J9811 Atelectasis: Secondary | ICD-10-CM

## 2019-07-18 DIAGNOSIS — R0902 Hypoxemia: Secondary | ICD-10-CM

## 2019-07-18 DIAGNOSIS — R9389 Abnormal findings on diagnostic imaging of other specified body structures: Secondary | ICD-10-CM

## 2019-07-18 DIAGNOSIS — R0602 Shortness of breath: Secondary | ICD-10-CM

## 2019-07-18 DIAGNOSIS — I959 Hypotension, unspecified: Secondary | ICD-10-CM

## 2019-07-18 LAB — CBC
HCT: 20.3 % — ABNORMAL LOW (ref 39.0–52.0)
Hemoglobin: 7 g/dL — ABNORMAL LOW (ref 13.0–17.0)
MCH: 31.7 pg (ref 26.0–34.0)
MCHC: 34.5 g/dL (ref 30.0–36.0)
MCV: 91.9 fL (ref 80.0–100.0)
Platelets: 96 10*3/uL — ABNORMAL LOW (ref 150–400)
RBC: 2.21 MIL/uL — ABNORMAL LOW (ref 4.22–5.81)
RDW: 13.2 % (ref 11.5–15.5)
WBC: 9.1 10*3/uL (ref 4.0–10.5)
nRBC: 0 % (ref 0.0–0.2)

## 2019-07-18 LAB — HEMOGLOBIN AND HEMATOCRIT, BLOOD
HCT: 29.8 % — ABNORMAL LOW (ref 39.0–52.0)
Hemoglobin: 10.3 g/dL — ABNORMAL LOW (ref 13.0–17.0)

## 2019-07-18 LAB — PREPARE RBC (CROSSMATCH)

## 2019-07-18 MED ORDER — SODIUM CHLORIDE 0.9% IV SOLUTION: Freq: Once | INTRAVENOUS | Status: DC

## 2019-07-18 MED ORDER — OXYCODONE HCL 5 MG PO TABS
10.0000 mg | ORAL_TABLET | ORAL | Status: DC | PRN
  Administered 2019-07-18 (×3): 10 mg via ORAL
  Filled 2019-07-18 (×3): qty 2

## 2019-07-18 MED ORDER — HYDROCODONE-ACETAMINOPHEN 10-325 MG PO TABS
1.0000 | ORAL_TABLET | ORAL | Status: DC | PRN
  Administered 2019-07-18 – 2019-07-19 (×3): 1 via ORAL
  Filled 2019-07-18 (×5): qty 1

## 2019-07-18 MED ORDER — KETOROLAC TROMETHAMINE 30 MG/ML IJ SOLN
30.0000 mg | Freq: Four times a day (QID) | INTRAMUSCULAR | Status: AC
  Administered 2019-07-18 – 2019-07-20 (×8): 30 mg via INTRAVENOUS
  Filled 2019-07-18 (×7): qty 1

## 2019-07-18 MED ORDER — SODIUM CHLORIDE 0.9% IV SOLUTION
Freq: Once | INTRAVENOUS | Status: AC
  Administered 2019-07-18: 10:00:00 via INTRAVENOUS

## 2019-07-18 MED ORDER — KETOROLAC TROMETHAMINE 30 MG/ML IJ SOLN
INTRAMUSCULAR | Status: AC
  Filled 2019-07-18: qty 1

## 2019-07-18 NOTE — Progress Notes (Signed)
Called Dr. Ellene Route regarding pain management. Tylenol max dose reached with percocet. New orders for oxycodone 10 mg. Will continue morphine and robaxin.

## 2019-07-18 NOTE — Consult Note (Signed)
NAME:  Bernard Dalton, MRN:  [id number], DOB:  September 29, 1981, LOS: 2 ADMISSION DATE:  07/16/2019, CONSULTATION DATE:  07/18/2019 REFERRING MD:  Kristeen Miss, MD, CHIEF COMPLAINT:  Hypoxemia, SOB and pleural effusion   Brief History   38 year old male with history of kyphoscoliosis who underwent a thoracic spine surgery for fusion.  Patient became hypoxemic, SOB and pleural effusion with Hg drop from 11 to 7 but patient is 4.5 liters positive.  Patient is in severe pain and not able to provide much history.  POC U/S was done bedside that showed a very elevated diaphragm on the right with minimal pleural effusion that is not enough to tap.  No fever or cough.  History of present illness   38 year old male with history of kyphoscoliosis who underwent a thoracic spine surgery for fusion.  Patient became hypoxemic, SOB and pleural effusion with Hg drop from 11 to 7 but patient is 4.5 liters positive.  Patient is in severe pain and not able to provide much history.  POC U/S was done bedside that showed a very elevated diaphragm on the right with minimal pleural effusion that is not enough to tap.  No fever or cough.  Past Medical History  As below  Significant Hospital Events   Surgery as above  Consults:  PCCM  Procedures:  Thoracic spine fusion  Significant Diagnostic Tests:  Thoracic spine CT that I reviewed myself with evidence of atelectasis and pleural effusion  Micro Data:  N/A  Antimicrobials:  N/A   Interim history/subjective:  C/O severe back pain and SOB, RN relays hypotension overnight.  Patient is on 125 ml/hr of LR and multiple boluses.  Objective   Blood pressure 109/65, pulse 92, temperature 98.9 F (37.2 C), temperature source Oral, resp. rate (!) 22, height 5\' 11"  (1.803 m), weight 72.8 kg, SpO2 100 %.        Intake/Output Summary (Last 24 hours) at 07/18/2019 1720 Last data filed at 07/18/2019 1550 Gross per 24 hour  Intake 4270.16 ml  Output 3050 ml  Net 1220.16 ml    Filed Weights   07/16/19 0546 07/16/19 1900  Weight: 68.9 kg 72.8 kg    Examination: General: Acutely ill appearing male, severe painful distress HENT: [place]/AT, PERRL, EOM-I and MMM Lungs: Diminished BS diffusely with dullness to percussion on the R>L Cardiovascular: RRR, Nl S1/S2 and -M/R/G Abdomen: Soft, NT, ND and +BS Extremities: -edema and -tenderness Neuro: Moving all ext to command Skin: incision noted  Resolved Hospital Problem list   N/A  Assessment & Plan:  38 year old male with scoliosis post of thoracic spine fusion.  Discussed with PCCM-NP.  Hypotension: likely drug related, concern for blood loss as well  - CT of the chest now  - ?if density is indicative of blood, may need thora  - Hold off diureses for now  - Blood loss - transfuse with target of 7  Pleural effusion:  - CT as ordered above  - No thora for now  Atelectasis:  - IS  - Flutter valve  - Pain control  SOB: due to atelectasis  - As above  - Titrate O2 for sat of 92-95%  - CXR now and in AM  PCCM will continue to follow.  Labs   CBC: Recent Labs  Lab 07/16/19 1316 07/16/19 1533 07/17/19 0501 07/18/19 0326  WBC  --   --  11.3* 9.1  HGB 12.6* 11.9* 8.7* 7.0*  HCT 37.0* 35.0* 24.8* 20.3*  MCV  --   --  90.5 91.9  PLT  --   --  120* 96*    Basic Metabolic Panel: Recent Labs  Lab 07/16/19 1316 07/16/19 1533 07/17/19 0501  NA 140 139 137  K 4.0 4.7 4.2  CL  --   --  105  CO2  --   --  24  GLUCOSE  --   --  145*  BUN  --   --  16  CREATININE  --   --  0.99  CALCIUM  --   --  7.5*   GFR: Estimated Creatinine Clearance: 104.2 mL/min (by C-G formula based on SCr of 0.99 mg/dL). Recent Labs  Lab 07/17/19 0501 07/18/19 0326  WBC 11.3* 9.1    Liver Function Tests: No results for input(s): AST, ALT, ALKPHOS, BILITOT, PROT, ALBUMIN in the last 168 hours. No results for input(s): LIPASE, AMYLASE in the last 168 hours. No results for input(s): AMMONIA in the last 168 hours.   ABG    Component Value Date/Time   PHART 7.375 07/16/2019 1533   PCO2ART 41.7 07/16/2019 1533   PO2ART 183.0 (H) 07/16/2019 1533   HCO3 24.5 07/16/2019 1533   TCO2 26 07/16/2019 1533   ACIDBASEDEF 1.0 07/16/2019 1533   O2SAT 100.0 07/16/2019 1533     Coagulation Profile: No results for input(s): INR, PROTIME in the last 168 hours.  Cardiac Enzymes: No results for input(s): CKTOTAL, CKMB, CKMBINDEX, TROPONINI in the last 168 hours.  HbA1C: Hgb A1c MFr Bld  Date/Time Value Ref Range Status  07/29/2016 08:03 AM 4.7 <5.7 % Final    Comment:      For the purpose of screening for the presence of diabetes:   <5.7%       Consistent with the absence of diabetes 5.7-6.4 %   Consistent with increased risk for diabetes (prediabetes) >=6.5 %     Consistent with diabetes   This assay result is consistent with a decreased risk of diabetes.   Currently, no consensus exists regarding use of hemoglobin A1c for diagnosis of diabetes in children.   According to American Diabetes Association (ADA) guidelines, hemoglobin A1c <7.0% represents optimal control in non-pregnant diabetic patients. Different metrics may apply to specific patient populations. Standards of Medical Care in Diabetes (ADA).       CBG: No results for input(s): GLUCAP in the last 168 hours.  Review of Systems:   Patient in a lot of pain, unable to answer questions fully  Past Medical History  He,  has a past medical history of ADD (attention deficit disorder), Allergic rhinitis, Anxiety and depression, Chronic prostatitis, Concussion, DDD (degenerative disc disease), cervical (08/2018), Diverticulosis, Elevation of optic disc, bilateral (2019), GERD (gastroesophageal reflux disease), Headache syndrome (02/2018), Headache syndrome (02/2018), IBS (irritable bowel syndrome), Nephrolithiasis, Panic, Scheuermann's kyphosis, Seasonal allergies, Spondylosis without myelopathy or radiculopathy, thoracic region (2019), Vision  abnormalities, and Vitamin B12 deficiency (05/2019).   Surgical History    Past Surgical History:  Procedure Laterality Date  . APPLICATION OF ROBOTIC ASSISTANCE FOR SPINAL PROCEDURE N/A 07/16/2019   Procedure: APPLICATION OF ROBOTIC ASSISTANCE FOR SPINAL PROCEDURE;  Surgeon: Kristeen Miss, MD;  Location: [facility];  Service: Neurosurgery;  Laterality: N/A;  . LASIK Bilateral 2011  . POSTERIOR LUMBAR FUSION 4 LEVEL N/A 07/16/2019   Procedure: Thoracic Four to Lumbar Two posterior fusion, Kyphectomy with osteotomies, segmental pedicle screw fixation;  Surgeon: Kristeen Miss, MD;  Location: [facility];  Service: Neurosurgery;  Laterality: N/A;  Thoracic Four  to Lumbar Two posterior fusion, Kyphectomy with osteotomies, segmental pedicle screw fixation  . REFRACTIVE SURGERY Bilateral      Social History   reports that he has never smoked. He has never used smokeless tobacco. He reports that he does not drink alcohol or use drugs.   Family History   His family history includes ADD / ADHD in his brother and brother; Alcohol abuse in his father, maternal grandfather, maternal grandmother, mother, paternal grandfather, and paternal grandmother; Congestive Heart Failure in his paternal grandmother; Kidney disease in his father; Stroke in his paternal grandmother.   Allergies Allergies  Allergen Reactions  . Prednisone Other (See Comments)    Severe anxiety, cognitive impairment, "hallucinations"---"I never want to take that med again"     Home Medications  Prior to Admission medications   Medication Sig Start Date End Date Taking? Authorizing Provider  acetaminophen (TYLENOL) 500 MG tablet Take 500 mg by mouth every 6 (six) hours as needed for moderate pain.    Yes [provider]  ADDERALL XR 10 MG 24 hr capsule Take 10 mg by mouth daily. 11/22/17  Yes [provider]  ALPRAZolam Duanne Moron) 0.25 MG tablet Take 0.25 mg by mouth daily as needed for anxiety.  08/15/15  Yes [provider]  famotidine (PEPCID) 10 MG tablet Take 10 mg by mouth daily as needed for heartburn or indigestion.   Yes [provider]  fexofenadine (ALLEGRA) 180 MG tablet Take 180 mg by mouth daily.   Yes [provider]  gabapentin (NEURONTIN) 300 MG capsule Take one po qAM, one po qPM and teo po qHS Patient taking differently: Take 300 mg by mouth 4 (four) times daily.  09/28/18  Yes Sater, Nanine Means, MD  polycarbophil (FIBERCON) 625 MG tablet Take 625 mg by mouth daily.   Yes [provider]  vitamin B-12 (CYANOCOBALAMIN) 1000 MCG tablet Take 1,000 mcg by mouth daily.   Yes [provider]  omeprazole (PRILOSEC) 40 MG capsule Take 1 capsule (40 mg total) by mouth daily. Patient not taking: Reported on 06/22/2019 05/23/19   Ma Hillock, DO    Rush Farmer, M.D. [facility] Pulmonary/Critical Care Medicine. Pager: [phone]. After hours pager: [phone].

## 2019-07-18 NOTE — Progress Notes (Signed)
Physical Therapy Treatment Patient Details Name: Bernard Dalton MRN: [id number] DOB: 03-15-1981 Today's Date: 07/18/2019    History of Present Illness Pt is a 38 y/o male with diagnosis of Scheuermann kyphosis. Pt is now s/p Kyphectomy at T7-T8 with pedicular subtraction osteotomy, segmental fixation from T4-L2 with placement of pedicle screws, and posterior arthrodesis using autograft and allograft T4-L2 on 07/16/19. Additional PMHx includes anxiety, depression, ADD, DDD, headache syndrome     PT Comments    Pt remains limited by significant pain in back and diaphragm; states he feels like there is a taut band running across him. Reporting pain at 5/10, however, teeth chattering and grimacing with any type of movement. Session focused on gentle diaphragmatic breathing, stretching, and bed level exercises. Also incorporated muscle energy techniques to decrease muscle guarding. Pt declining out of bed mobility at this time. Will progress as tolerated.     Follow Up Recommendations  CIR     Equipment Recommendations  Other (comment)(TBA)    Recommendations for Other Services Rehab consult     Precautions / Restrictions Precautions Precautions: Fall;Back Precaution Booklet Issued: No Precaution Comments: verbally reviewed Required Braces or Orthoses: Spinal Brace Spinal Brace: Thoracolumbosacral orthotic;Applied in sitting position Restrictions Weight Bearing Restrictions: No    Mobility  Bed Mobility               General bed mobility comments: deferred by patient  Transfers                    Ambulation/Gait                 Stairs             Wheelchair Mobility    Modified Rankin (Stroke Patients Only)       Balance                                            Cognition Arousal/Alertness: Awake/alert Behavior During Therapy: WFL for tasks assessed/performed Overall Cognitive Status: Within Functional Limits for tasks  assessed                                        Exercises Other Exercises Other Exercises: Gentle diaphragmic breathing with tactile feedback Other Exercises: TA contractions x 10 Other Exercises: Bilateral heel slides x 5 Other Exercises: Supine: hamstring and knee to chest stretching with MET    General Comments        Pertinent Vitals/Pain Pain Assessment: 0-10 Pain Score: 5  Pain Location: diaphragm, chest, incisional Pain Descriptors / Indicators: Discomfort;Crying;Grimacing;Guarding;Sore;Sharp Pain Intervention(s): Limited activity within patient's tolerance;Monitored during session;Premedicated before session    Home Living                      Prior Function            PT Goals (current goals can now be found in the care plan section) Acute Rehab PT Goals Patient Stated Goal: less pain Potential to Achieve Goals: Good Progress towards PT goals: Not progressing toward goals - comment(limited by pain)    Frequency    Min 5X/week      PT Plan Current plan remains appropriate    Co-evaluation  AM-PAC PT "6 Clicks" Mobility   Outcome Measure  Help needed turning from your back to your side while in a flat bed without using bedrails?: A Little Help needed moving from lying on your back to sitting on the side of a flat bed without using bedrails?: A Little Help needed moving to and from a bed to a chair (including a wheelchair)?: A Little Help needed standing up from a chair using your arms (e.g., wheelchair or bedside chair)?: A Little Help needed to walk in hospital room?: A Little Help needed climbing 3-5 steps with a railing? : A Lot 6 Click Score: 17    End of Session   Activity Tolerance: Patient limited by pain Patient left: in bed;with call bell/phone within reach;with bed alarm set;with nursing/sitter in room Nurse Communication: Mobility status PT Visit Diagnosis: Pain;Dizziness and giddiness  (R42);Muscle weakness (generalized) (M62.81)     Time: 1450-1520 PT Time Calculation (min) (ACUTE ONLY): 30 min  Charges:  $Therapeutic Exercise: 23-37 mins                     Ellamae Sia, [place], DPT Acute Rehabilitation Services Pager [phone] Office [phone]   Willy Eddy 07/18/2019, 5:24 PM

## 2019-07-18 NOTE — Procedures (Signed)
[place] Chest L: trivial effusion, small amount atelectasis R: small effusion, raised hemidiaphragm, significant atelectasis

## 2019-07-18 NOTE — Progress Notes (Signed)
Patient ID: Bernard Dalton, male   DOB: January 04, 1981, 38 y.o.   MRN: [id number] Blood pressure in the 90s Heart rate stable Patient's postoperative hemoglobin is decreased to 7 g This is secondary to acute blood loss anemia from the surgery He will require 2 units of transfusion of packed red cells Pain is still a major issue He seems to respond to the Percocet but is reached the Tylenol limit We will use oxycodone orally He complains of tightness in the upper abdomen and around his chest He is breathing with shallow breaths We will continue supportive care would like to obtain CT of the T-spine when he is a bit more comfortable.

## 2019-07-19 ENCOUNTER — Inpatient Hospital Stay (HOSPITAL_COMMUNITY): Payer: BC Managed Care – PPO

## 2019-07-19 DIAGNOSIS — J95821 Acute postprocedural respiratory failure: Secondary | ICD-10-CM

## 2019-07-19 DIAGNOSIS — J90 Pleural effusion, not elsewhere classified: Secondary | ICD-10-CM

## 2019-07-19 LAB — CBC
HCT: 27 % — ABNORMAL LOW (ref 39.0–52.0)
Hemoglobin: 9.4 g/dL — ABNORMAL LOW (ref 13.0–17.0)
MCH: 31.2 pg (ref 26.0–34.0)
MCHC: 34.8 g/dL (ref 30.0–36.0)
MCV: 89.7 fL (ref 80.0–100.0)
Platelets: 97 10*3/uL — ABNORMAL LOW (ref 150–400)
RBC: 3.01 MIL/uL — ABNORMAL LOW (ref 4.22–5.81)
RDW: 13.2 % (ref 11.5–15.5)
WBC: 8.3 10*3/uL (ref 4.0–10.5)
nRBC: 0 % (ref 0.0–0.2)

## 2019-07-19 LAB — BASIC METABOLIC PANEL
Anion gap: 5 (ref 5–15)
BUN: 7 mg/dL (ref 6–20)
CO2: 27 mmol/L (ref 22–32)
Calcium: 7.6 mg/dL — ABNORMAL LOW (ref 8.9–10.3)
Chloride: 103 mmol/L (ref 98–111)
Creatinine, Ser: 0.84 mg/dL (ref 0.61–1.24)
GFR calc Af Amer: >60 mL/min (ref >60)
GFR calc non Af Amer: >60 mL/min (ref >60)
Glucose, Bld: 93 mg/dL (ref 70–99)
Potassium: 3.8 mmol/L (ref 3.5–5.1)
Sodium: 135 mmol/L (ref 135–145)

## 2019-07-19 LAB — PHOSPHORUS: Phosphorus: 1.4 mg/dL — ABNORMAL LOW (ref 2.5–4.6)

## 2019-07-19 LAB — MAGNESIUM: Magnesium: 1.8 mg/dL (ref 1.7–2.4)

## 2019-07-19 MED ORDER — OXYCODONE-ACETAMINOPHEN 5-325 MG PO TABS
1.0000 | ORAL_TABLET | ORAL | Status: DC | PRN
  Administered 2019-07-19 – 2019-07-23 (×18): 1 via ORAL
  Filled 2019-07-19 (×20): qty 1

## 2019-07-19 MED ORDER — OXYCODONE HCL 5 MG PO TABS
5.0000 mg | ORAL_TABLET | ORAL | Status: DC | PRN
  Administered 2019-07-19 – 2019-07-23 (×17): 5 mg via ORAL
  Filled 2019-07-19 (×17): qty 1

## 2019-07-19 NOTE — Progress Notes (Signed)
Physical Therapy Treatment Patient Details Name: Bernard Dalton MRN: [id number] DOB: [birth year] Today's Date: 07/19/2019    History of Present Illness Pt is a 38 y/o male with diagnosis of Scheuermann kyphosis. Pt is now s/p Kyphectomy at T7-T8 with pedicular subtraction osteotomy, segmental fixation from T4-L2 with placement of pedicle screws, and posterior arthrodesis using autograft and allograft T4-L2 on 07/16/19. Additional PMHx includes anxiety, depression, ADD, DDD, headache syndrome     PT Comments    Pt requiring encouragement but agreeable to get out of bed to chair. Overall, moving fairly well just remains limited by significant pain. Requiring two person min assist for transfers. Increased time required. Education re: pain management/control, splinting, positioning, brace use. Encouraged pt to sit up in chair x 1 hour. Suspect pt will continue to progress, but may need CIR at discharge to maximize functional independence.     Follow Up Recommendations  CIR     Equipment Recommendations  Other (comment)(TBA)    Recommendations for Other Services Rehab consult     Precautions / Restrictions Precautions Precautions: Fall;Back Precaution Booklet Issued: No Precaution Comments: verbally reviewed Required Braces or Orthoses: Spinal Brace Spinal Brace: Thoracolumbosacral orthotic;Applied in sitting position Restrictions Weight Bearing Restrictions: No    Mobility  Bed Mobility Overal bed mobility: Needs Assistance Bed Mobility: Rolling;Sidelying to Sit Rolling: Min assist Sidelying to sit: Mod assist       General bed mobility comments: Rolling onto left side with minA, requiring modA for trunk elevation up to sitting  Transfers Overall transfer level: Needs assistance Equipment used: Rolling walker (2 wheeled) Transfers: Sit to/from [facility] Sit to Stand: Min assist;+2 physical assistance;+2 safety/equipment Stand pivot transfers: Min assist;+2  physical assistance;+2 safety/equipment       General transfer comment: MinA + 2 to stand and pivot towards left to recliner  Ambulation/Gait                 Stairs             Wheelchair Mobility    Modified Rankin (Stroke Patients Only)       Balance Overall balance assessment: Needs assistance Sitting-balance support: Feet supported;Single extremity supported;Bilateral upper extremity supported Sitting balance-Leahy Scale: Fair Sitting balance - Comments: heavily guarding   Standing balance support: Bilateral upper extremity supported Standing balance-Leahy Scale: Poor Standing balance comment: reliant on external support                            Cognition Arousal/Alertness: Awake/alert Behavior During Therapy: WFL for tasks assessed/performed Overall Cognitive Status: Within Functional Limits for tasks assessed                                        Exercises      General Comments        Pertinent Vitals/Pain Pain Assessment: 0-10 Pain Score: 9  Pain Location: diaphragm, chest, incisional Pain Descriptors / Indicators: Discomfort;Crying;Grimacing;Guarding;Sore;Sharp Pain Intervention(s): Limited activity within patient's tolerance;Premedicated before session;Monitored during session(morphine given prior to session)    Home Living                      Prior Function            PT Goals (current goals can now be found in the care plan section) Acute Rehab PT Goals Patient Stated Goal:  less pain Potential to Achieve Goals: Good Progress towards PT goals: Progressing toward goals    Frequency    Min 5X/week      PT Plan Current plan remains appropriate    Co-evaluation              AM-PAC PT "6 Clicks" Mobility   Outcome Measure  Help needed turning from your back to your side while in a flat bed without using bedrails?: A Little Help needed moving from lying on your back to sitting  on the side of a flat bed without using bedrails?: A Little Help needed moving to and from a bed to a chair (including a wheelchair)?: A Little Help needed standing up from a chair using your arms (e.g., wheelchair or bedside chair)?: A Little Help needed to walk in hospital room?: A Little Help needed climbing 3-5 steps with a railing? : A Lot 6 Click Score: 17    End of Session Equipment Utilized During Treatment: Back brace Activity Tolerance: Patient limited by pain Patient left: with call bell/phone within reach;in chair;with chair alarm set;with family/visitor present Nurse Communication: Mobility status PT Visit Diagnosis: Pain;Dizziness and giddiness (R42);Muscle weakness (generalized) (M62.81)     Time: 1610-9604 PT Time Calculation (min) (ACUTE ONLY): 37 min  Charges:  $Therapeutic Activity: 23-37 mins                     Ellamae Sia, [place], DPT Acute Rehabilitation Services Pager [phone] Office [phone]    Willy Eddy 07/19/2019, 5:03 PM

## 2019-07-19 NOTE — Progress Notes (Signed)
Patient ID: Bernard Dalton, male   DOB: January 10, 1981, 38 y.o.   MRN: [id number] Vital signs stable and post transfusion hemoglobin 9.4. Appreciate eval of ccm. Hopeful to mobilize today Will benefit from in pt rehab  would like to assess standing films when able. Progressing slowly,

## 2019-07-19 NOTE — Progress Notes (Signed)
Physical Therapy Treatment Patient Details Name: Bernard Dalton MRN: [id number] DOB: [birth year] Today's Date: 07/19/2019    History of Present Illness Pt is a 38 y/o male with diagnosis of Scheuermann kyphosis. Pt is now s/p Kyphectomy at T7-T8 with pedicular subtraction osteotomy, segmental fixation from T4-L2 with placement of pedicle screws, and posterior arthrodesis using autograft and allograft T4-L2 on 07/16/19. Additional PMHx includes anxiety, depression, ADD, DDD, headache syndrome     PT Comments    Additional session to assist RN with transfer back to bed. Requiring minA and walker to stand pivot from recliner to bed. Anticipate pt will be able to begin progressing ambulation tomorrow.    Follow Up Recommendations  CIR     Equipment Recommendations  Rolling walker with 5" wheels;3in1 (PT)( )    Recommendations for Other Services Rehab consult     Precautions / Restrictions Precautions Precautions: Fall;Back Precaution Booklet Issued: No Precaution Comments: verbally reviewed Required Braces or Orthoses: Spinal Brace Spinal Brace: Thoracolumbosacral orthotic;Applied in sitting position Restrictions Weight Bearing Restrictions: No    Mobility  Bed Mobility Overal bed mobility: Needs Assistance Bed Mobility: Rolling;Sit to Sidelying Rolling: Min guard Sidelying to sit: Mod assist     Sit to sidelying: Mod assist General bed mobility comments: ModA to assist legs back into bed. cues for log roll technique  Transfers Overall transfer level: Needs assistance Equipment used: Rolling walker (2 wheeled) Transfers: Sit to/from [facility] Sit to Stand: Min assist;+2 safety/equipment Stand pivot transfers: Min assist;+2 safety/equipment       General transfer comment: MinA + 2 to stand and pivot towards right from chair to bed  Ambulation/Gait                 Stairs             Wheelchair Mobility    Modified Rankin (Stroke Patients  Only)       Balance Overall balance assessment: Needs assistance Sitting-balance support: Feet supported;Single extremity supported;Bilateral upper extremity supported Sitting balance-Leahy Scale: Fair Sitting balance - Comments: heavily guarding   Standing balance support: Bilateral upper extremity supported Standing balance-Leahy Scale: Poor Standing balance comment: reliant on external support                            Cognition Arousal/Alertness: Awake/alert Behavior During Therapy: WFL for tasks assessed/performed Overall Cognitive Status: Within Functional Limits for tasks assessed                                        Exercises      General Comments        Pertinent Vitals/Pain Pain Assessment: Faces Pain Score: 9  Faces Pain Scale: Hurts whole lot Pain Location: diaphragm, chest, incisional Pain Descriptors / Indicators: Discomfort;Grimacing;Guarding;Sore;Sharp Pain Intervention(s): Monitored during session;Premedicated before session;Limited activity within patient's tolerance    Home Living                      Prior Function            PT Goals (current goals can now be found in the care plan section) Acute Rehab PT Goals Patient Stated Goal: less pain Potential to Achieve Goals: Good Progress towards PT goals: Progressing toward goals    Frequency    Min 5X/week      PT Plan  Current plan remains appropriate    Co-evaluation              AM-PAC PT "6 Clicks" Mobility   Outcome Measure  Help needed turning from your back to your side while in a flat bed without using bedrails?: A Little Help needed moving from lying on your back to sitting on the side of a flat bed without using bedrails?: A Little Help needed moving to and from a bed to a chair (including a wheelchair)?: A Little Help needed standing up from a chair using your arms (e.g., wheelchair or bedside chair)?: A Little Help needed to  walk in hospital room?: A Little Help needed climbing 3-5 steps with a railing? : A Lot 6 Click Score: 17    End of Session Equipment Utilized During Treatment: Back brace Activity Tolerance: Patient limited by pain Patient left: with call bell/phone within reach;with family/visitor present;in bed Nurse Communication: Mobility status PT Visit Diagnosis: Pain;Dizziness and giddiness (R42);Muscle weakness (generalized) (M62.81)     Time: 5638-7564 PT Time Calculation (min) (ACUTE ONLY): 12 min  Charges:  $Therapeutic Activity: 8-22 mins                     Ellamae Sia, PT, DPT Acute Rehabilitation Services Pager [phone] Office [phone]    Willy Eddy 07/19/2019, 5:07 PM

## 2019-07-19 NOTE — Consult Note (Signed)
NAME:  Bernard Dalton, MRN:  [id number], DOB:  Mar 26, 1981, LOS: 3 ADMISSION DATE:  07/16/2019, CONSULTATION DATE:  07/18/2019 REFERRING MD:  Kristeen Miss, MD, CHIEF COMPLAINT:  Hypoxemia, SOB and pleural effusion   Brief History   38 year old male with history of kyphoscoliosis who underwent a thoracic spine surgery for fusion. Patient became hypoxemic, SOB and pleural effusion with Hg drop from 11 to 7 but patient is 4.5 liters positive. PCCM consulted to evaluate pleural effusion.    History of present illness   39 year old male with history of kyphoscoliosis who underwent a thoracic spine surgery for fusion. Patient became hypoxemic, SOB and pleural effusion with Hg drop from 11 to 7 but patient is 4.5 liters positive. Continues to be in mod-severe pain post-op. POC U/S was done bedside that showed a very elevated diaphragm on the right with minimal pleural effusion that is not enough to tap. Patient's postoperative hemoglobin decreased to 7 secondary to acute blood loss anemia from the surgery. He required 2 units of transfusion of packed red cells. He developed hypotension which was treated with IV fluids.  Past Medical History  Scheuermann kyphosis, ADHD, occipital neuralgia, persistent headaches, anxiety, depression, degenerative disk disease,  Significant Hospital Events   8/3 <<< Kyphectomy at T7-T8 with pedicular subtraction osteotomy, segmental fixation from T4-L2 with robotic assistance placement of pedicle screws T4-L2 and posterior arthrodesis using autograft and allograft T4-L2  Consults:  PCCM  Procedures:  Thoracic spine fusion 07/16/2019  Significant Diagnostic Tests:  CT thoracic spine 7/30 << Spondylosis, disc degeneration, and kyphotic deformity centered at T9. No detected progression from 10/2018 MRI. T9-10 interbody ankylosis. Lower thoracic degenerative facet spurring and ligament thickening with bifo. Raminal impingement at T8-9. Left nephrolithiasis. X-ray thoracolumbar  spine 8/3 << T4 through L2 bilateral posterior spinal fusion hardware placed. CXR 8/5 << Moderate volume right pleural effusion with associated right lung atelectasis. Thoracic spine CT 8/5 << Mod R pleural effusion and mild to mod L pleural effusion, progressive since chest x-ray yesterday. Rule out hemothorax. Bibasilar atelectasis right greater than left. Interval osteotomy at the T7-8 disc space for kyphosis correction. 3 mm retrolisthesis at T7-8 with posterior decompression. Kyphosis has improved now measuring 48 degrees. CXR 8/6 << Pending Micro Data:  MRSA PCR 7/28 <<< Negative SARS Coronavirus 2 7/30 <<< Negative  Antimicrobials:  N/A   Interim history/subjective:  Had an episode of hypotension that was treated with IV fluids. Patient reports he slept well last night and feeling a little better but continues to have SOB. He reports a feeling of tightness around abdomen "belt squeezing around intestines". He denies any CP or palpitations and reports he has not had any head or neck pain since the surgery.  Objective   Blood pressure (!) 98/51, pulse 93, temperature 98.1 F (36.7 C), temperature source Oral, resp. rate 17, height 5\' 11"  (1.803 m), weight 72.8 kg, SpO2 100 %.        Intake/Output Summary (Last 24 hours) at 07/19/2019 0758 Last data filed at 07/19/2019 0523 Gross per 24 hour  Intake 3199.91 ml  Output 4300 ml  Net -1100.09 ml   Filed Weights   07/16/19 0546 07/16/19 1900  Weight: 68.9 kg 72.8 kg    Examination: General: Well-appearing male in mod pain, looks uncomfortable in bed HENT: [place]/AT. Neck supple Lungs: Diminished BS diffusely with dullness to percussion on the R>L,  Cardiovascular: RRR, S1/S2, no m/r/g Abdomen: Soft, NT, ND and +BS Extremities: Warm. Distal pulses  2+  Neuro: Moving all extremities. A&Ox4. Skin: Well-perfused. Incision noted  Resolved Hospital Problem list   N/A  Assessment & Plan:  38 year old male with scoliosis s/p thoracic  spine fusion with improvement in kyphosis. Continues to have SOB with evidence of mod bilateral pleural effusions and atelectasis R>L No indication for pleurocentesis at the moment. Continue pain management and monitor w/ daily chest imaging.   Hypotension. - BP dropped to sBP in 90s and dBP in 50s - Resolved w/ administration of IV fluids - sBP in 100s and 110s  Atelectasis, Pleural effusion. - CXR 8/5 showing moderate volume right pleural effusion with associated right lung atelectasis. - Shallow breaths on exam - Effusion not large enough for pleurocentesis, will consider diuresis - F/u repeat CXR today - Continue pain control w/ percocet, robaxin, oxycodone and prn morphine  Post-op anemia. - Post-op hgb down to 7 on 8/5 from 15 pre-op.  - S/p 2 units of pRBCs - Hgb improved to 9.4   - Monitor with daily CBC  Labs   CBC: Recent Labs  Lab 07/16/19 1533 07/17/19 0501 07/18/19 0326 07/18/19 1915 07/19/19 0113  WBC  --  11.3* 9.1  --  8.3  HGB 11.9* 8.7* 7.0* 10.3* 9.4*  HCT 35.0* 24.8* 20.3* 29.8* 27.0*  MCV  --  90.5 91.9  --  89.7  PLT  --  120* 96*  --  97*    Basic Metabolic Panel: Recent Labs  Lab 07/16/19 1316 07/16/19 1533 07/17/19 0501 07/19/19 0113  NA 140 139 137 135  K 4.0 4.7 4.2 3.8  CL  --   --  105 103  CO2  --   --  24 27  GLUCOSE  --   --  145* 93  BUN  --   --  16 7  CREATININE  --   --  0.99 0.84  CALCIUM  --   --  7.5* 7.6*  MG  --   --   --  1.8  PHOS  --   --   --  1.4*   GFR: Estimated Creatinine Clearance: 122.8 mL/min (by C-G formula based on SCr of 0.84 mg/dL). Recent Labs  Lab 07/17/19 0501 07/18/19 0326 07/19/19 0113  WBC 11.3* 9.1 8.3    Liver Function Tests: No results for input(s): AST, ALT, ALKPHOS, BILITOT, PROT, ALBUMIN in the last 168 hours. No results for input(s): LIPASE, AMYLASE in the last 168 hours. No results for input(s): AMMONIA in the last 168 hours.  ABG    Component Value Date/Time   PHART 7.375  07/16/2019 1533   PCO2ART 41.7 07/16/2019 1533   PO2ART 183.0 (H) 07/16/2019 1533   HCO3 24.5 07/16/2019 1533   TCO2 26 07/16/2019 1533   ACIDBASEDEF 1.0 07/16/2019 1533   O2SAT 100.0 07/16/2019 1533     Coagulation Profile: No results for input(s): INR, PROTIME in the last 168 hours.  Cardiac Enzymes: No results for input(s): CKTOTAL, CKMB, CKMBINDEX, TROPONINI in the last 168 hours.  HbA1C: Hgb A1c MFr Bld  Date/Time Value Ref Range Status  07/29/2016 08:03 AM 4.7 <5.7 % Final    Comment:      For the purpose of screening for the presence of diabetes:   <5.7%       Consistent with the absence of diabetes 5.7-6.4 %   Consistent with increased risk for diabetes (prediabetes) >=6.5 %     Consistent with diabetes   This assay result is consistent with a  decreased risk of diabetes.   Currently, no consensus exists regarding use of hemoglobin A1c for diagnosis of diabetes in children.   According to American Diabetes Association (ADA) guidelines, hemoglobin A1c <7.0% represents optimal control in non-pregnant diabetic patients. Different metrics may apply to specific patient populations. Standards of Medical Care in Diabetes (ADA).       CBG: No results for input(s): GLUCAP in the last 168 hours.  Review of Systems:   Patient in a lot of pain, unable to answer questions fully  Past Medical History  He,  has a past medical history of ADD (attention deficit disorder), Allergic rhinitis, Anxiety and depression, Chronic prostatitis, Concussion, DDD (degenerative disc disease), cervical (08/2018), Diverticulosis, Elevation of optic disc, bilateral (2019), GERD (gastroesophageal reflux disease), Headache syndrome (02/2018), Headache syndrome (02/2018), IBS (irritable bowel syndrome), Nephrolithiasis, Panic, Scheuermann's kyphosis, Seasonal allergies, Spondylosis without myelopathy or radiculopathy, thoracic region (2019), Vision abnormalities, and Vitamin B12 deficiency  (05/2019).   Surgical History    Past Surgical History:  Procedure Laterality Date  . APPLICATION OF ROBOTIC ASSISTANCE FOR SPINAL PROCEDURE N/A 07/16/2019   Procedure: APPLICATION OF ROBOTIC ASSISTANCE FOR SPINAL PROCEDURE;  Surgeon: Kristeen Miss, MD;  Location: [facility];  Service: Neurosurgery;  Laterality: N/A;  . LASIK Bilateral 2011  . POSTERIOR LUMBAR FUSION 4 LEVEL N/A 07/16/2019   Procedure: Thoracic Four to Lumbar Two posterior fusion, Kyphectomy with osteotomies, segmental pedicle screw fixation;  Surgeon: Kristeen Miss, MD;  Location: [facility];  Service: Neurosurgery;  Laterality: N/A;  Thoracic Four to Lumbar Two posterior fusion, Kyphectomy with osteotomies, segmental pedicle screw fixation  . REFRACTIVE SURGERY Bilateral      Social History   reports that he has never smoked. He has never used smokeless tobacco. He reports that he does not drink alcohol or use drugs.   Family History   His family history includes ADD / ADHD in his brother and brother; Alcohol abuse in his father, maternal grandfather, maternal grandmother, mother, paternal grandfather, and paternal grandmother; Congestive Heart Failure in his paternal grandmother; Kidney disease in his father; Stroke in his paternal grandmother.   Allergies Allergies  Allergen Reactions  . Prednisone Other (See Comments)    Severe anxiety, cognitive impairment, "hallucinations"---"I never want to take that med again"     Home Medications  Prior to Admission medications   Medication Sig Start Date End Date Taking? Authorizing Provider  acetaminophen (TYLENOL) 500 MG tablet Take 500 mg by mouth every 6 (six) hours as needed for moderate pain.    Yes [provider]  ADDERALL XR 10 MG 24 hr capsule Take 10 mg by mouth daily. 11/22/17  Yes [provider]  ALPRAZolam Duanne Moron) 0.25 MG tablet Take 0.25 mg by mouth daily as needed for anxiety.  08/15/15  Yes [provider]  famotidine (PEPCID) 10 MG tablet Take  10 mg by mouth daily as needed for heartburn or indigestion.   Yes [provider]  fexofenadine (ALLEGRA) 180 MG tablet Take 180 mg by mouth daily.   Yes [provider]  gabapentin (NEURONTIN) 300 MG capsule Take one po qAM, one po qPM and teo po qHS Patient taking differently: Take 300 mg by mouth 4 (four) times daily.  09/28/18  Yes Sater, Nanine Means, MD  polycarbophil (FIBERCON) 625 MG tablet Take 625 mg by mouth daily.   Yes [provider]  vitamin B-12 (CYANOCOBALAMIN) 1000 MCG tablet Take 1,000 mcg by mouth daily.   Yes [provider]  omeprazole (  PRILOSEC) 40 MG capsule Take 1 capsule (40 mg total) by mouth daily. Patient not taking: Reported on 06/22/2019 05/23/19   Howard Pouch A, DO

## 2019-07-19 NOTE — Progress Notes (Signed)
Inpatient Rehabilitation Admissions Coordinator  Inpatient rehab consult received. I met with patient and his wife at bedside for rehab assessment. Not yet at a level to consider for an inpt rehab admit. Pain is his barrier to progress. I will follow.  Danne Baxter, RN, MSN Rehab Admissions Coordinator [phone] 07/19/2019 1:29 PM

## 2019-07-20 LAB — BPAM RBC
Blood Product Expiration Date: 202008262359
Blood Product Expiration Date: 202008262359
ISSUE DATE / TIME: 202008050948
ISSUE DATE / TIME: 202008051311
Unit Type and Rh: 6200
Unit Type and Rh: 6200

## 2019-07-20 LAB — TYPE AND SCREEN
ABO/RH(D): A POS
Antibody Screen: NEGATIVE
Unit division: 0
Unit division: 0

## 2019-07-20 MED ORDER — BUPIVACAINE HCL (PF) 0.5 % IJ SOLN
30.0000 mL | Freq: Once | INTRAMUSCULAR | Status: AC
  Administered 2019-07-20: 30 mL via PERINEURAL
  Filled 2019-07-20: qty 30

## 2019-07-20 MED ORDER — LORAZEPAM 2 MG/ML IJ SOLN
INTRAMUSCULAR | Status: AC
  Filled 2019-07-20: qty 1

## 2019-07-20 MED ORDER — LORAZEPAM 2 MG/ML IJ SOLN
0.5000 mg | Freq: Once | INTRAMUSCULAR | Status: AC
  Administered 2019-07-20: 0.5 mg via INTRAVENOUS

## 2019-07-20 NOTE — Progress Notes (Signed)
Patient ID: Bernard Dalton, male   DOB: 1981/03/26, 38 y.o.   MRN: [id number] Bernard Dalton has been out of bed today and has walked to the bathroom and walked in the hallway.  He is still having considerable chest pain and discomfort with a ventral tightness that he feels.  Because of the severity of the pain I decided do pericostal blocks in the region of his PSO with 10 cc of half percent bupivacaine injected subcutaneously and around the rib head.  This did not provide any immediate relief.  But we will see how he does as time passes.  I also had given him 0.5 mg of Ativan IV to see if this will help to calm some anxious feelings he has about the degree of pain and severity of his recovery.

## 2019-07-20 NOTE — Progress Notes (Signed)
Inpatient Rehabilitation Admissions Coordinator  I continue to follow pt's progress. Pain limiting mobilization. I will follow up on Monday.  Danne Baxter, RN, MSN Rehab Admissions Coordinator [phone] 07/20/2019 1:23 PM

## 2019-07-20 NOTE — Progress Notes (Addendum)
Physical Therapy Treatment Patient Details Name: Bernard Dalton MRN: [id number] DOB: [birth year] Today's Date: 07/20/2019    History of Present Illness Pt is a 38 y/o male with diagnosis of Scheuermann kyphosis. Pt is now s/p Kyphectomy at T7-T8 with pedicular subtraction osteotomy, segmental fixation from T4-L2 with placement of pedicle screws, and posterior arthrodesis using autograft and allograft T4-L2 on 07/16/19. Additional PMHx includes anxiety, depression, ADD, DDD, headache syndrome     PT Comments    Pt continues with significant pain and discomfort, along back/diaphragm/costal region but agreeable for participation in therapy. Making good progress today, with increased ambulation to 90 feet using walker at a min guard assist level. Suspect pt will continue to progress well with adequate pain control/management.    Follow Up Recommendations  CIR     Equipment Recommendations  Rolling walker with 5" wheels;3in1 (PT)   Recommendations for Other Services Rehab consult     Precautions / Restrictions Precautions Precautions: Fall;Back Precaution Booklet Issued: No Precaution Comments: verbally reviewed Required Braces or Orthoses: Spinal Brace Spinal Brace: Thoracolumbosacral orthotic;Applied in sitting position Restrictions Weight Bearing Restrictions: No    Mobility  Bed Mobility Overal bed mobility: Needs Assistance Bed Mobility: Sidelying to Sit   Sidelying to sit: Min assist       General bed mobility comments: MinA for trunk elevation, cues for sequencing  Transfers Overall transfer level: Needs assistance Equipment used: Rolling walker (2 wheeled) Transfers: Sit to/from Stand Sit to Stand: Min assist Stand pivot transfers: Min assist;+2 safety/equipment;+2 physical assistance       General transfer comment: MinA to rise from edge of bed  Ambulation/Gait Ambulation/Gait assistance: Min guard Gait Distance (Feet): 90 Feet Assistive device: Rolling walker (2  wheeled) Gait Pattern/deviations: Step-through pattern;Decreased dorsiflexion - right;Decreased dorsiflexion - left;Decreased step length - left;Narrow base of support Gait velocity: decreased Gait velocity interpretation: <1.31 ft/sec, indicative of household ambulator General Gait Details: Cues for wider BOS, slow and steady gait speed   Stairs             Wheelchair Mobility    Modified Rankin (Stroke Patients Only)       Balance Overall balance assessment: Needs assistance Sitting-balance support: Feet supported;Single extremity supported;Bilateral upper extremity supported Sitting balance-Leahy Scale: Fair Sitting balance - Comments: heavily guarding   Standing balance support: Bilateral upper extremity supported Standing balance-Leahy Scale: Poor Standing balance comment: reliant on external support                            Cognition Arousal/Alertness: Awake/alert Behavior During Therapy: WFL for tasks assessed/performed Overall Cognitive Status: Within Functional Limits for tasks assessed                                        Exercises      General Comments        Pertinent Vitals/Pain Pain Assessment: Faces Faces Pain Scale: Hurts whole lot Pain Location: diaphragm, chest, incisional Pain Descriptors / Indicators: Discomfort;Grimacing;Guarding;Sore;Sharp Pain Intervention(s): Monitored during session;Limited activity within patient's tolerance;Premedicated before session    Home Living                      Prior Function            PT Goals (current goals can now be found in the care plan section) Acute Rehab PT  Goals Patient Stated Goal: less pain Potential to Achieve Goals: Good Progress towards PT goals: Progressing toward goals    Frequency    Min 5X/week      PT Plan Current plan remains appropriate    Co-evaluation              AM-PAC PT "6 Clicks" Mobility   Outcome Measure   Help needed turning from your back to your side while in a flat bed without using bedrails?: A Little Help needed moving from lying on your back to sitting on the side of a flat bed without using bedrails?: A Little Help needed moving to and from a bed to a chair (including a wheelchair)?: A Little Help needed standing up from a chair using your arms (e.g., wheelchair or bedside chair)?: A Little Help needed to walk in hospital room?: A Little Help needed climbing 3-5 steps with a railing? : A Lot 6 Click Score: 17    End of Session Equipment Utilized During Treatment: Back brace Activity Tolerance: Patient tolerated treatment well Patient left: with call bell/phone within reach;in chair Nurse Communication: Mobility status PT Visit Diagnosis: Pain;Dizziness and giddiness (R42);Muscle weakness (generalized) (M62.81)     Time: 0712-1975 PT Time Calculation (min) (ACUTE ONLY): 28 min  Charges:  $Gait Training: 8-22 mins $Therapeutic Activity: 8-22 mins                     Ellamae Sia, PT, DPT Acute Rehabilitation Services Pager [phone] Office [phone]    Willy Eddy 07/20/2019, 3:34 PM

## 2019-07-20 NOTE — Progress Notes (Signed)
Physical Therapy Treatment Patient Details Name: Bernard Dalton MRN: [id number] DOB: 16-Sep-1981 Today's Date: 07/20/2019    History of Present Illness Pt is a 37 y/o male with diagnosis of Scheuermann kyphosis. Pt is now s/p Kyphectomy at T7-T8 with pedicular subtraction osteotomy, segmental fixation from T4-L2 with placement of pedicle screws, and posterior arthrodesis using autograft and allograft T4-L2 on 07/16/19. Additional PMHx includes anxiety, depression, ADD, DDD, headache syndrome     PT Comments    Pt seen for additional session to assist from recliner to bathroom and back to bed for nerve block procedure by Dr. Ellene Route. Pt requiring min guard assist for mobility, ambulating with walker. Will continue to progress as tolerated.   Follow Up Recommendations  CIR     Equipment Recommendations  Rolling walker with 5" wheels;3in1 (PT)( )    Recommendations for Other Services Rehab consult     Precautions / Restrictions Precautions Precautions: Fall;Back Precaution Booklet Issued: No Precaution Comments: verbally reviewed Required Braces or Orthoses: Spinal Brace Spinal Brace: Thoracolumbosacral orthotic;Applied in sitting position Restrictions Weight Bearing Restrictions: No    Mobility  Bed Mobility Overal bed mobility: Needs Assistance Bed Mobility: Sidelying to Sit   Sidelying to sit: Min assist       General bed mobility comments: Left on edge of bed for nerve block procedure  Transfers Overall transfer level: Needs assistance Equipment used: Rolling walker (2 wheeled) Transfers: Sit to/from Stand Sit to Stand: Min guard Stand pivot transfers: Min assist;+2 safety/equipment;+2 physical assistance       General transfer comment: Min guard to rise from toilet  Ambulation/Gait Ambulation/Gait assistance: Min guard Gait Distance (Feet): 10 Feet Assistive device: Rolling walker (2 wheeled) Gait Pattern/deviations: Step-through pattern;Decreased dorsiflexion -  right;Decreased dorsiflexion - left;Decreased step length - left;Narrow base of support Gait velocity: decreased Gait velocity interpretation: <1.31 ft/sec, indicative of household ambulator General Gait Details: Cues for wider BOS, slow and steady gait speed   Stairs             Wheelchair Mobility    Modified Rankin (Stroke Patients Only)       Balance Overall balance assessment: Needs assistance Sitting-balance support: Feet supported;Single extremity supported;Bilateral upper extremity supported Sitting balance-Leahy Scale: Fair Sitting balance - Comments: heavily guarding   Standing balance support: Bilateral upper extremity supported Standing balance-Leahy Scale: Poor Standing balance comment: reliant on external support                            Cognition Arousal/Alertness: Awake/alert Behavior During Therapy: WFL for tasks assessed/performed Overall Cognitive Status: Within Functional Limits for tasks assessed                                        Exercises      General Comments        Pertinent Vitals/Pain Pain Assessment: Faces Faces Pain Scale: Hurts whole lot Pain Location: diaphragm, chest, incisional Pain Descriptors / Indicators: Discomfort;Grimacing;Guarding;Sore;Sharp Pain Intervention(s): Monitored during session;Premedicated before session;Limited activity within patient's tolerance    Home Living                      Prior Function            PT Goals (current goals can now be found in the care plan section) Acute Rehab PT Goals Patient Stated Goal: less  pain Potential to Achieve Goals: Good Progress towards PT goals: Progressing toward goals    Frequency    Min 5X/week      PT Plan Current plan remains appropriate    Co-evaluation              AM-PAC PT "6 Clicks" Mobility   Outcome Measure  Help needed turning from your back to your side while in a flat bed without using  bedrails?: A Little Help needed moving from lying on your back to sitting on the side of a flat bed without using bedrails?: A Little Help needed moving to and from a bed to a chair (including a wheelchair)?: A Little Help needed standing up from a chair using your arms (e.g., wheelchair or bedside chair)?: A Little Help needed to walk in hospital room?: A Little Help needed climbing 3-5 steps with a railing? : A Lot 6 Click Score: 17    End of Session Equipment Utilized During Treatment: Back brace Activity Tolerance: Patient tolerated treatment well Patient left: with call bell/phone within reach;in chair Nurse Communication: Mobility status PT Visit Diagnosis: Pain;Dizziness and giddiness (R42);Muscle weakness (generalized) (M62.81)     Time: 1206-1220 PT Time Calculation (min) (ACUTE ONLY): 14 min  Charges:  $Gait Training: 8-22 mins $Therapeutic Activity: 8-22 mins                     Ellamae Sia, PT, DPT Acute Rehabilitation Services Pager [phone] Office [phone]    Willy Eddy 07/20/2019, 3:38 PM

## 2019-07-21 MED ORDER — CALCIUM CARBONATE ANTACID 500 MG PO CHEW
1.0000 | CHEWABLE_TABLET | Freq: Three times a day (TID) | ORAL | Status: DC | PRN
  Administered 2019-07-22: 200 mg via ORAL
  Filled 2019-07-21: qty 1

## 2019-07-21 MED ORDER — LORAZEPAM 2 MG/ML IJ SOLN
0.5000 mg | Freq: Four times a day (QID) | INTRAMUSCULAR | Status: DC | PRN
  Administered 2019-07-21: 0.5 mg via INTRAVENOUS
  Filled 2019-07-21: qty 1

## 2019-07-21 NOTE — Progress Notes (Signed)
Patient ID: Bernard Dalton, male   DOB: 1980-12-18, 38 y.o.   MRN: [id number] Patient seems much more comfortable today He notes that bilateral blocks seem to give great relief yesterday afternoon and evening He notes that 75% of pain seems to be back We will see how he does today hopefully pain will subside over time Continue supportive care

## 2019-07-21 NOTE — Progress Notes (Signed)
PT Cancellation Note  Patient Details Name: Bernard Dalton MRN: [id number] DOB: 1981/12/11   Cancelled Treatment:    Reason Eval/Treat Not Completed: Patient declined, no reason specified;Pain limiting ability to participate Pt politely declines PT today despite being premedicated prior to session reporting increased pain after nerve block wore off. Reports being up this AM to bathroom with nursing. Will follow.   Marguarite Arbour A Abril Cappiello 07/21/2019, 12:57 PM Wray Kearns, PT, DPT Acute Rehabilitation Services Pager [phone] Office [phone]

## 2019-07-22 ENCOUNTER — Telehealth: Payer: Self-pay

## 2019-07-22 MED ORDER — ENOXAPARIN SODIUM 40 MG/0.4ML ~~LOC~~ SOLN
40.0000 mg | SUBCUTANEOUS | Status: DC
  Administered 2019-07-22 – 2019-07-23 (×2): 40 mg via SUBCUTANEOUS
  Filled 2019-07-22 (×2): qty 0.4

## 2019-07-22 MED ORDER — BUPIVACAINE HCL (PF) 0.5 % IJ SOLN
20.0000 mL | INTRAMUSCULAR | Status: AC
  Administered 2019-07-22: 20 mL
  Filled 2019-07-22: qty 20

## 2019-07-22 NOTE — Progress Notes (Signed)
Patient ID: Bernard Dalton, male   DOB: 03-14-1981, 38 y.o.   MRN: [id number] Vital signs are stable Patient notes that pain is slowly decreasing though he notes it is about 60% of what it has been.  He is still having difficulty with mobilization and I advised that we should repeat the pericostal blocks that I did the other day.  I also believe that he is being ready to transfer to the progressive side.  I will write orders for this today.  Pericostal blocks performed with 10 cc of half percent bupivacaine in each pericostal region.

## 2019-07-22 NOTE — Progress Notes (Signed)
Physical Therapy Treatment Patient Details Name: Bernard Dalton MRN: [id number] DOB: 07-17-1981 Today's Date: 07/22/2019    History of Present Illness Pt is a 38 y/o male with diagnosis of Scheuermann kyphosis. Pt is now s/p Kyphectomy at T7-T8 with pedicular subtraction osteotomy, segmental fixation from T4-L2 with placement of pedicle screws, and posterior arthrodesis using autograft and allograft T4-L2 on 07/16/19. Additional PMHx includes anxiety, depression, ADD, DDD, headache syndrome     PT Comments    Patient progressing slowly towards PT goals. Improved ambulation distance today with use of RW for support and close min guard for safety. Pt eager and ready to get up OOB and walk but distance limited secondary to feeling weak and dizzy after 1 lap. Noted to have drop in SBP from 135 to 111, pt pallor and symptomatic. RN notified. Able to recall 3/3 back precautions. Discussed pain management, importance of stay hydrated, trying to get up and walk more/increase activity etc. Will continue to follow and progress as tolerated.    Follow Up Recommendations  CIR     Equipment Recommendations  Rolling walker with 5" wheels;3in1 (PT)    Recommendations for Other Services       Precautions / Restrictions Precautions Precautions: Fall;Back Precaution Booklet Issued: No Precaution Comments: verbally reviewed Required Braces or Orthoses: Spinal Brace Spinal Brace: Thoracolumbosacral orthotic;Applied in sitting position Restrictions Weight Bearing Restrictions: No    Mobility  Bed Mobility Overal bed mobility: Needs Assistance Bed Mobility: Rolling;Sidelying to Sit Rolling: Min guard Sidelying to sit: Min guard;HOB elevated     Sit to sidelying: Min guard;HOB elevated General bed mobility comments: Able to roll and reach for rail and get to EOB without physical assist; increased time/effort/pain. Able to return to supine with HOB elevated.  Transfers Overall transfer level: Needs  assistance Equipment used: Rolling walker (2 wheeled) Transfers: Sit to/from Stand Sit to Stand: Min guard         General transfer comment: Min guard to stand from EOB x1, from toilet x1. Reports feeling "weak" and like he is dehydrated.  Ambulation/Gait Ambulation/Gait assistance: Min guard Gait Distance (Feet): 150 Feet Assistive device: Rolling walker (2 wheeled) Gait Pattern/deviations: Step-through pattern;Decreased step length - left;Narrow base of support;Decreased step length - right Gait velocity: decreased   General Gait Details: Slow, mostly steady gait with cues for upright posture. Reports feeling weaker with increassed distance, noted to have some pallor. Drop in BP noted so returned to supine.   Stairs             Wheelchair Mobility    Modified Rankin (Stroke Patients Only)       Balance Overall balance assessment: Needs assistance Sitting-balance support: Feet supported;No upper extremity supported Sitting balance-Leahy Scale: Fair Sitting balance - Comments: Able to donn brace with some assist   Standing balance support: During functional activity;Bilateral upper extremity supported Standing balance-Leahy Scale: Poor Standing balance comment: reliant on external support                            Cognition Arousal/Alertness: Awake/alert Behavior During Therapy: WFL for tasks assessed/performed Overall Cognitive Status: Within Functional Limits for tasks assessed                                        Exercises      General Comments General comments (skin integrity, edema,  etc.): Supine BP 135/87, sitting BP post activity 111/71, reports feeling "weak" and dizzy. Rn notified. SCDs donned and deferred sitting in chair.      Pertinent Vitals/Pain Pain Assessment: Faces Faces Pain Scale: Hurts even more Pain Location: diaphragm, chest Pain Descriptors / Indicators: Discomfort;Grimacing;Guarding;Sore;Sharp Pain  Intervention(s): Repositioned;Monitored during session;Limited activity within patient's tolerance;Premedicated before session    Home Living                      Prior Function            PT Goals (current goals can now be found in the care plan section) Progress towards PT goals: Progressing toward goals    Frequency    Min 5X/week      PT Plan Current plan remains appropriate    Co-evaluation              AM-PAC PT "6 Clicks" Mobility   Outcome Measure  Help needed turning from your back to your side while in a flat bed without using bedrails?: A Little Help needed moving from lying on your back to sitting on the side of a flat bed without using bedrails?: A Little Help needed moving to and from a bed to a chair (including a wheelchair)?: A Little Help needed standing up from a chair using your arms (e.g., wheelchair or bedside chair)?: A Little Help needed to walk in hospital room?: A Little Help needed climbing 3-5 steps with a railing? : A Lot 6 Click Score: 17    End of Session Equipment Utilized During Treatment: Back brace Activity Tolerance: Patient tolerated treatment well;Treatment limited secondary to medical complications (Comment)(drop in BP, dizziness) Patient left: in bed;with call bell/phone within reach;with SCD's reapplied Nurse Communication: Mobility status PT Visit Diagnosis: Pain;Dizziness and giddiness (R42);Muscle weakness (generalized) (M62.81) Pain - part of body: (side, chest)     Time: 2979-8921 PT Time Calculation (min) (ACUTE ONLY): 36 min  Charges:  $Gait Training: 23-37 mins                     Wray Kearns, PT, DPT Acute Rehabilitation Services Pager [phone] Office [place] 07/22/2019, 12:07 PM

## 2019-07-22 NOTE — Telephone Encounter (Signed)
Pt currently hospitalized.

## 2019-07-23 ENCOUNTER — Inpatient Hospital Stay (HOSPITAL_COMMUNITY): Payer: BC Managed Care – PPO

## 2019-07-23 MED ORDER — CELECOXIB 100 MG PO CAPS: ORAL_CAPSULE | ORAL | 5 refills | Status: DC

## 2019-07-23 MED ORDER — METHOCARBAMOL 500 MG PO TABS: 500.0000 mg | ORAL_TABLET | Freq: Four times a day (QID) | ORAL | 3 refills | Status: DC | PRN

## 2019-07-23 MED ORDER — OXYCODONE-ACETAMINOPHEN 5-325 MG PO TABS: 1.0000 | ORAL_TABLET | ORAL | 0 refills | Status: DC | PRN

## 2019-07-23 NOTE — Progress Notes (Signed)
Occupational Therapy Treatment Patient Details Name: Bernard Dalton MRN: [id number] DOB: 03/07/1981 Today's Date: 07/23/2019    History of present illness Pt is a 38 y/o male with diagnosis of Scheuermann kyphosis. Pt is now s/p Kyphectomy at T7-T8 with pedicular subtraction osteotomy, segmental fixation from T4-L2 with placement of pedicle screws, and posterior arthrodesis using autograft and allograft T4-L2 on 07/16/19. Additional PMHx includes anxiety, depression, ADD, DDD, headache syndrome    OT comments  Pt progressing well this session with much improved activity tolerance and BADL engagement. Focused session on adaptive BADL strategies and completing functional household distance for BADL. Pt very anxious this date and wanting to walk around unit to leave room for space. Discussed home adaptive strategies during this time. Reviewed back precaution with BADL h/o. Also introduced LB ADL equipment to pt at this time, he reports understanding. Continued education on adaptive peri care strategies, specifically use of toilet aide as needed. Pt asking many questions and anxious throughout session. Recommend BSC for use over toilet. Pt progressed well during admission and does not require OT follow up at this time, equipment needs anticipated to be met during hospital stay. Will continue to follow while acute.   Follow Up Recommendations  Supervision/Assistance - 24 hour;No OT follow up    Equipment Recommendations  3 in 1 bedside commode;Other (comment)(toilet aide, LB ADL equip)    Recommendations for Other Services      Precautions / Restrictions Precautions Precautions: Fall;Back Precaution Booklet Issued: Yes (comment) Precaution Comments: Able to recall 3/3 precautions Required Braces or Orthoses: Spinal Brace Spinal Brace: Thoracolumbosacral orthotic;Applied in sitting position Restrictions Weight Bearing Restrictions: No       Mobility Bed Mobility               General bed  mobility comments: recieved in chair  Transfers Overall transfer level: Needs assistance Equipment used: Rolling walker (2 wheeled) Transfers: Sit to/from Stand Sit to Stand: Min guard         General transfer comment: min guard for safety    Balance Overall balance assessment: Needs assistance Sitting-balance support: Feet supported;No upper extremity supported Sitting balance-Leahy Scale: Good Sitting balance - Comments: donned brace with some assist   Standing balance support: During functional activity;Bilateral upper extremity supported Standing balance-Leahy Scale: Good Standing balance comment: able to stand with and without BUE support while completing functional tasks                           ADL either performed or assessed with clinical judgement   ADL Overall ADL's : Needs assistance/impaired             Lower Body Bathing: Minimal assistance;Bed level;Sit to/from stand;Adhering to back precautions Lower Body Bathing Details (indicate cue type and reason): able to follow LB mgt per back precuations Upper Body Dressing : Set up;Sitting   Lower Body Dressing: Minimal assistance;Sit to/from stand;Sitting/lateral leans;Bed level;Adhering to back precautions Lower Body Dressing Details (indicate cue type and reason): per back precautions Toilet Transfer: Min Forensic psychologist Details (indicate cue type and reason): BSC over toilet Toileting- Clothing Manipulation and Hygiene: Min guard;Sit to/from stand;Sitting/lateral lean;Adhering to back precautions Toileting - Clothing Manipulation Details (indicate cue type and reason): educated on use of toilet aide, and standing peri care to maintain precautions     Functional mobility during ADLs: Min guard;Rolling walker General ADL Comments: pt progressing well this session, able to engage much edu  Vision Patient Visual Report: No change from baseline     Perception     Praxis       Cognition Arousal/Alertness: Awake/alert Behavior During Therapy: WFL for tasks assessed/performed;Anxious Overall Cognitive Status: Within Functional Limits for tasks assessed                                 General Comments: anxious this date, requesting to walk off of the unit to get space and fresh air        Exercises     Shoulder Instructions       General Comments VSS.    Pertinent Vitals/ Pain       Pain Assessment: Faces Faces Pain Scale: Hurts little more Pain Location: diaphragm, chest Pain Descriptors / Indicators: Guarding;Grimacing;Sore Pain Intervention(s): Repositioned;Limited activity within patient's tolerance;Patient requesting pain meds-RN notified  Home Living                                          Prior Functioning/Environment              Frequency  Min 2X/week        Progress Toward Goals  OT Goals(current goals can now be found in the care plan section)  Progress towards OT goals: Progressing toward goals  Acute Rehab OT Goals Patient Stated Goal: to go home ASAP OT Goal Formulation: With patient Time For Goal Achievement: 07/31/19 Potential to Achieve Goals: Good  Plan Discharge plan needs to be updated    Co-evaluation                 AM-PAC OT "6 Clicks" Daily Activity     Outcome Measure   Help from another person eating meals?: None Help from another person taking care of personal grooming?: A Little Help from another person toileting, which includes using toliet, bedpan, or urinal?: A Little Help from another person bathing (including washing, rinsing, drying)?: A Little Help from another person to put on and taking off regular upper body clothing?: A Little Help from another person to put on and taking off regular lower body clothing?: A Little 6 Click Score: 19    End of Session Equipment Utilized During Treatment: Gait belt;Rolling walker;Back brace  OT Visit Diagnosis:  Other abnormalities of gait and mobility (R26.89);Pain Pain - part of body: (chest/back)   Activity Tolerance Patient tolerated treatment well   Patient Left with call bell/phone within reach;with chair alarm set   Nurse Communication Mobility status;Patient requests pain meds        Time: 1005-1100 OT Time Calculation (min): 55 min  Charges: OT General Charges $OT Visit: 1 Visit OT Treatments $Self Care/Home Management : 53-67 mins  Zenovia Jarred, MSOT, OTR/L Behavioral Health OT/ Acute Relief OT [facility] Office: [phone]  Zenovia Jarred 07/23/2019, 1:31 PM

## 2019-07-23 NOTE — TOC Transition Note (Signed)
Transition of Care [facility]) - CM/SW Discharge Note   Patient Details  Name: Bernard Dalton MRN: [id number] Date of Birth: 03/02/1981  Transition of Care [facility]) CM/SW Contact:  Ella Bodo, RN Phone Number: 07/23/2019, 1:16 PM   Clinical Narrative: Pt is a 38 y/o male with diagnosis of Scheuermann kyphosis. Pt is now s/p Kyphectomy at T7-T8 with pedicular subtraction osteotomy, segmental fixation from T4-L2 with placement of pedicle screws, and posterior arthrodesis using autograft and allograft T4-L2 on 07/16/19. PTA, pt independent, lives at home with spouse.  PT recommending OP follow up.  Pt prefers to follow up at PT OP rehab facility in [place] near his home, where he is an established pt.  MD will need to give Rx for "PT-eval and treat" for follow up, and has been made aware.  OT recommending RW and 3 in 1, and DME has been ordered from [facility].  Wife able to provide care at dc and transportation to OP appts.        Final next level of care: OP Rehab Barriers to Discharge: Barriers Resolved          Discharge Plan and Services   Discharge Planning Services: CM Consult            DME Arranged: 3-N-1, Walker rolling DME Agency: [employer] Date DME Agency Contacted: 07/23/19 Time DME Agency Contacted: 69 Representative spoke with at DME Agency: Andree Coss                 Readmission Risk Interventions Readmission Risk Prevention Plan 07/23/2019  Post Dischage Appt Not Complete  Appt Comments Pt prefers to make own follow up  Medication Screening Complete  Transportation Screening Complete  Some recent data might be hidden   Reinaldo Raddle, RN, BSN  Trauma/Neuro ICU Case Manager [phone]

## 2019-07-23 NOTE — Progress Notes (Signed)
All discharge instruction given to patient, No further questions. RN will discharge pt at this time.   Nikki Dom

## 2019-07-23 NOTE — Progress Notes (Signed)
Inpatient Rehabilitation Admissions Coordinator  Patient has progressed well with therapy. Not in need of an inpt rehab admission at this level. Outpatient therapy is recommended by therapy. We will sign off at this time.  Danne Baxter, RN, MSN Rehab Admissions Coordinator [phone] 07/23/2019 11:16 AM

## 2019-07-23 NOTE — Discharge Summary (Signed)
Physician Discharge Summary  Patient ID: Bernard Dalton MRN: [id number] DOB/AGE: 1981/01/06 38 y.o.  Admit date: 07/16/2019 Discharge date: 07/23/2019  Admission Diagnoses: Scheurmans kyphosis  Discharge Diagnoses: Scheurmans kyphosis Active Problems:   Scheuermann's kyphosis   Discharged Condition: fair  Hospital Course: Patient was admitted to undergo surgical correction of a Shermans kyphosis secondary to a greater than 90 degree lumbar lordosis with a 80 degree kyphotic curve in the thoracic spine.  He underwent surgery and had significant postoperative pain over the next 4 days time.  He ultimately underwent pericostal blocks which seemed to help with the pain and after 2 sets of these the pain is come under substantial control such that he has been able to tolerate very modest amounts of narcotic pain medication.  Patient is discharged home with Percocet 04/14/2024 1 or 2 every 4 hours he has been taking approximately 25 mg of oxycodone per day.  He also was given a prescription for Robaxin 500 mg every 6 hours as needed in addition to Celebrex 100 mg a day.  Consults: None  Significant Diagnostic Studies: Postoperative x-rays shown correction to normal with 50 degrees of lordosis of the lumbar spine.  38 degrees of kyphosis in the thoracic spine  Treatments: surgery: Pedicle subtraction osteotomies for kyphectomy and fixation from T4-L2 with posterior and posterior lateral fusion  Discharge Exam: Blood pressure 120/77, pulse 83, temperature (!) 97.3 F (36.3 C), temperature source Oral, resp. rate (!) 24, height 5\' 11"  (1.803 m), weight 72.8 kg, SpO2 97 %. Incision is clean and dry Station and gait are intact  Disposition: Discharge disposition: 01-Home or Self Care       Discharge Instructions    Call MD for:  redness, tenderness, or signs of infection (pain, swelling, redness, odor or green/yellow discharge around incision site)   Complete by: As directed    Call MD for:   severe uncontrolled pain   Complete by: As directed    Call MD for:  temperature >100.4   Complete by: As directed    Diet - low sodium heart healthy   Complete by: As directed    Discharge instructions   Complete by: As directed    Okay to shower. Do not apply salves or appointments to incision. No heavy lifting with the upper extremities greater than 15 pounds. May resume driving when not requiring pain medication and patient feels comfortable with doing so.   Incentive spirometry RT   Complete by: As directed    Increase activity slowly   Complete by: As directed      Allergies as of 07/23/2019      Reactions   Prednisone Other (See Comments)   Severe anxiety, cognitive impairment, "hallucinations"---"I never want to take that med again"      Medication List    TAKE these medications   acetaminophen 500 MG tablet Commonly known as: TYLENOL Take 500 mg by mouth every 6 (six) hours as needed for moderate pain.   Adderall XR 10 MG 24 hr capsule Generic drug: amphetamine-dextroamphetamine Take 10 mg by mouth daily.   ALPRAZolam 0.25 MG tablet Commonly known as: XANAX Take 0.25 mg by mouth daily as needed for anxiety.   celecoxib 100 MG capsule Commonly known as: CeleBREX Take once or twice daily   famotidine 10 MG tablet Commonly known as: PEPCID Take 10 mg by mouth daily as needed for heartburn or indigestion.   fexofenadine 180 MG tablet Commonly known as: ALLEGRA Take 180 mg by mouth daily.  gabapentin 300 MG capsule Commonly known as: NEURONTIN Take one po qAM, one po qPM and teo po qHS What changed:   how much to take  how to take this  when to take this  additional instructions   methocarbamol 500 MG tablet Commonly known as: ROBAXIN Take 1 tablet (500 mg total) by mouth every 6 (six) hours as needed for muscle spasms.   omeprazole 40 MG capsule Commonly known as: PRILOSEC Take 1 capsule (40 mg total) by mouth daily.   oxyCODONE-acetaminophen  5-325 MG tablet Commonly known as: PERCOCET/ROXICET Take 1-2 tablets by mouth every 4 (four) hours as needed for moderate pain.   polycarbophil 625 MG tablet Commonly known as: FIBERCON Take 625 mg by mouth daily.   vitamin B-12 1000 MCG tablet Commonly known as: CYANOCOBALAMIN Take 1,000 mcg by mouth daily.            Durable Medical Equipment  (From admission, onward)         Start     Ordered   07/23/19 1303  For home use only DME Walker rolling  Once    Question:  Patient needs a walker to treat with the following condition  Answer:  Scheurmann's disease   07/23/19 1302   07/23/19 1303  For home use only DME 3 n 1  Once     07/23/19 1302   Unscheduled  For home use only DME Walker rolling  [facility])  Once    Question:  Patient needs a walker to treat with the following condition  Answer:  Scheuermann's kyphosis   07/23/19 1704           Signed: Blanchie Dessert Muneer Leider 07/23/2019, 5:04 PM

## 2019-07-23 NOTE — Progress Notes (Signed)
Physical Therapy Treatment Patient Details Name: Bernard Dalton MRN: [id number] DOB: 30-Oct-1981 Today's Date: 07/23/2019    History of Present Illness Pt is a 38 y/o male with diagnosis of Scheuermann kyphosis. Pt is now s/p Kyphectomy at T7-T8 with pedicular subtraction osteotomy, segmental fixation from T4-L2 with placement of pedicle screws, and posterior arthrodesis using autograft and allograft T4-L2 on 07/16/19. Additional PMHx includes anxiety, depression, ADD, DDD, headache syndrome     PT Comments    Patient progressing well towards PT goals. Reports having a rough night and not sleeping well in the new bed. Found laying in recliner chair upon arrival. Tolerated transfers and gait training with supervision progressing to Mod I for safety. Able to recall 3/3 back precautions and adhere to them during mobility. Pain seems improved after second nerve block yesterday despite wearing off. Lengthy discussion/questions regarding exercise recommendations, brace, walking, pain management, discharge etc. Pt eager to return home today. Safe to do so from a mobility standpoint. DIscharge recommendation updated to OPPT once cleared by surgeon due to improvement in pain and mobility. Will follow.    Follow Up Recommendations  Outpatient PT;Follow surgeon's recommendation for DC plan and follow-up therapies;Supervision - Intermittent     Equipment Recommendations  Rolling walker with 5" wheels;3in1 (PT)    Recommendations for Other Services       Precautions / Restrictions Precautions Precautions: Fall;Back Precaution Booklet Issued: No Precaution Comments: Able to recall 3/3 precautions Required Braces or Orthoses: Spinal Brace Spinal Brace: Thoracolumbosacral orthotic;Applied in sitting position Restrictions Weight Bearing Restrictions: No    Mobility  Bed Mobility               General bed mobility comments: Laying in recliner chair upon PT arrival.  Transfers Overall transfer  level: Needs assistance Equipment used: Rolling walker (2 wheeled) Transfers: Sit to/from Stand Sit to Stand: Supervision         General transfer comment: Supervision for safety. Stood from [employer], from toilet x1. No dizziness.  Ambulation/Gait Ambulation/Gait assistance: Modified independent (Device/Increase time) Gait Distance (Feet): 350 Feet Assistive device: Rolling walker (2 wheeled) Gait Pattern/deviations: Step-through pattern;Decreased stride length Gait velocity: decreased   General Gait Details: Slow, mostly steady gait with cues for upright posture and RW management. Tolerated going up/down slight inclines without difficulty.   Stairs             Wheelchair Mobility    Modified Rankin (Stroke Patients Only)       Balance Overall balance assessment: Needs assistance Sitting-balance support: Feet supported;No upper extremity supported Sitting balance-Leahy Scale: Good     Standing balance support: During functional activity Standing balance-Leahy Scale: Good Standing balance comment: Able to stand at sink and wash hands without UE support; able to donn brace in standing.                            Cognition Arousal/Alertness: Awake/alert Behavior During Therapy: WFL for tasks assessed/performed Overall Cognitive Status: Within Functional Limits for tasks assessed                                 General Comments: Ready to get out of the hospital.      Exercises      General Comments General comments (skin integrity, edema, etc.): VSS.      Pertinent Vitals/Pain Pain Assessment: Faces Faces Pain Scale: Hurts little more  Pain Location: diaphragm, chest Pain Descriptors / Indicators: Guarding;Grimacing;Sore Pain Intervention(s): Repositioned;Monitored during session;Premedicated before session    Home Living                      Prior Function            PT Goals (current goals can now be found in  the care plan section) Progress towards PT goals: Progressing toward goals    Frequency    Min 5X/week      PT Plan Discharge plan needs to be updated    Co-evaluation              AM-PAC PT "6 Clicks" Mobility   Outcome Measure  Help needed turning from your back to your side while in a flat bed without using bedrails?: A Little Help needed moving from lying on your back to sitting on the side of a flat bed without using bedrails?: A Little Help needed moving to and from a bed to a chair (including a wheelchair)?: A Little Help needed standing up from a chair using your arms (e.g., wheelchair or bedside chair)?: None Help needed to walk in hospital room?: None Help needed climbing 3-5 steps with a railing? : A Little 6 Click Score: 20    End of Session Equipment Utilized During Treatment: Back brace Activity Tolerance: Patient tolerated treatment well Patient left: in chair;with call bell/phone within reach Nurse Communication: Mobility status PT Visit Diagnosis: Pain;Dizziness and giddiness (R42);Muscle weakness (generalized) (M62.81) Pain - part of body: (ribs, diaphragm)     Time: 4076-8088 PT Time Calculation (min) (ACUTE ONLY): 37 min  Charges:  $Gait Training: 8-22 mins $Self Care/Home Management: 8-22                     Wray Kearns, PT, DPT Acute Rehabilitation Services Pager [phone] Office [place] 07/23/2019, 11:00 AM

## 2019-07-25 DIAGNOSIS — J3089 Other allergic rhinitis: Secondary | ICD-10-CM | POA: Diagnosis not present

## 2019-07-25 DIAGNOSIS — J3081 Allergic rhinitis due to animal (cat) (dog) hair and dander: Secondary | ICD-10-CM | POA: Diagnosis not present

## 2019-07-25 DIAGNOSIS — J301 Allergic rhinitis due to pollen: Secondary | ICD-10-CM | POA: Diagnosis not present

## 2019-07-26 DIAGNOSIS — F419 Anxiety disorder, unspecified: Secondary | ICD-10-CM | POA: Diagnosis not present

## 2019-07-30 DIAGNOSIS — J3081 Allergic rhinitis due to animal (cat) (dog) hair and dander: Secondary | ICD-10-CM | POA: Diagnosis not present

## 2019-07-30 DIAGNOSIS — J3089 Other allergic rhinitis: Secondary | ICD-10-CM | POA: Diagnosis not present

## 2019-07-30 DIAGNOSIS — J301 Allergic rhinitis due to pollen: Secondary | ICD-10-CM | POA: Diagnosis not present

## 2019-08-08 DIAGNOSIS — J301 Allergic rhinitis due to pollen: Secondary | ICD-10-CM | POA: Diagnosis not present

## 2019-08-08 DIAGNOSIS — J3081 Allergic rhinitis due to animal (cat) (dog) hair and dander: Secondary | ICD-10-CM | POA: Diagnosis not present

## 2019-08-08 DIAGNOSIS — J3089 Other allergic rhinitis: Secondary | ICD-10-CM | POA: Diagnosis not present

## 2019-08-09 DIAGNOSIS — F419 Anxiety disorder, unspecified: Secondary | ICD-10-CM | POA: Diagnosis not present

## 2019-08-10 ENCOUNTER — Encounter: Payer: Self-pay | Admitting: Gastroenterology

## 2019-08-10 ENCOUNTER — Ambulatory Visit: Payer: BC Managed Care – PPO | Admitting: Gastroenterology

## 2019-08-10 VITALS — BP 110/74 | HR 96 | Temp 98.5°F | Ht 71.0 in | Wt 156.2 lb

## 2019-08-10 DIAGNOSIS — R1013 Epigastric pain: Secondary | ICD-10-CM | POA: Diagnosis not present

## 2019-08-10 NOTE — Progress Notes (Signed)
HPI: This is a  Very pleasant 38 year old man who was referred to me by Tammi Sou, MD  to evaluate epigastric burning.    Chief complaint is epigastric burning  As a teenager he had a lot of IBS symptoms.  Periodically has had diarrhea over the years and actually had acute episode of diverticulitis in 2008.  For the past 2 years his bowels have not really evened out since starting to take fiber on a daily basis.  2 to 3 years ago he started having significant headaches and back issues from some significant scoliosis.  He has been taking ibuprofen 800 mg to 1200 mg a day every day for years leading into a back surgery just earlier this month.  Back in June he started having epigastric burning.  Omeprazole 40 mg a day prescribed by his primary care physician because significant constipation he stopped that.  Instead he started taking Pepcid 10 mg once daily with moderate but not complete relief.  Since after his surgery which was about 4 weeks ago he has had epigastric burning again.  He restart Prilosec 40 mg a day which he takes 10 minutes prior to his breakfast meal.  He will still have burning in his epigastrium.  He takes Celebrex 3 or 4 times in surgery and Robaxin about the same.  He does not take any other NSAIDs.  His weight is down 8 to 10 pounds.  He has no dysphasia    Old Data Reviewed: He recently went spinal surgery for Shermans kyphosis; looks like he had a significant amount of postoperative pain was discharged home on Celebrex, Robaxin and Percocets  Labs 06/2019 prior to his recent spine surgery showed normal CBC, normal complete about profile      Review of systems: Pertinent positive and negative review of systems were noted in the above HPI section. All other review negative.   Past Medical History:  Diagnosis Date  . ADD (attention deficit disorder)   . Allergic rhinitis    + allerg conjunctivitis.  Immunotherapy started 2019  . Anxiety and depression   .  Chronic prostatitis    chronic irritative voiding symptoms: pelvic PT via [facility] helping as of 07/2016  . Concussion   . DDD (degenerative disc disease), cervical 08/2018   (Dr. Maia Petties):  C5-C6 --ESI 25% improvement in fall 2019.  Pt desiring C spine surgery to see if this alleviates his symptom complex..  . Diverticulosis    with 'itis per pt report; however, review of old record CT reports shows mild SB enteritis with mesenteric adenopathy 06/2015; then 11/2014 CT abd/pelv done for urinary frequency and hematuria showed NORMAL abd/pelv  . Elevation of optic disc, bilateral 2019   Normal variant per ophtho--Dr. Larose Kells.  Marland Kitchen GERD (gastroesophageal reflux disease)   . 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 papilledema on optho exam.  Dr. [name] with neuro saw him 04/2018, no papilledema noted and opening CSF pressure was nl.  CSF labs nl.  ESR and ANA normal.  No clear reason for HA's found.  Imipr trial no help.  Trig pt inj's in occipi mm's 05/2018 by Dr. [name]. no help.Topamax trial 02/2019.  Marland Kitchen Headache syndrome 02/2018   As of 07/2018, pt has plan to see Dr. [name], NS/spine specialist as next step (his HAs are likely cervicogenic).  Dr. Maia Petties did C7-T1 ESI on 09/11/18--25% improvement.   . IBS (irritable bowel syndrome)  diarr predom as per old PCP records  . Nephrolithiasis    Nonobstructive, noted on CT (1-2 mm)-has never passed a stone that he knows of.  . Panic   . 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 Seasonal allergies   . Spondylosis without myelopathy or radiculopathy, thoracic region 2019  . Vision abnormalities   . Vitamin B12 deficiency 05/2019   intrinsic factor ab NEG. B12 normalized with oral B12    Past Surgical History:  Procedure Laterality Date  . APPLICATION OF ROBOTIC ASSISTANCE FOR SPINAL PROCEDURE N/A 07/16/2019   Procedure: APPLICATION OF ROBOTIC  ASSISTANCE FOR SPINAL PROCEDURE;  Surgeon: Kristeen Miss, MD;  Location: [facility];  Service: Neurosurgery;  Laterality: N/A;  . LASIK Bilateral 2011  . POSTERIOR LUMBAR FUSION 4 LEVEL N/A 07/16/2019   Procedure: Thoracic Four to Lumbar Two posterior fusion, Kyphectomy with osteotomies, segmental pedicle screw fixation;  Surgeon: Kristeen Miss, MD;  Location: [place];  Service: Neurosurgery;  Laterality: N/A;  Thoracic Four to Lumbar Two posterior fusion, Kyphectomy with osteotomies, segmental pedicle screw fixation  . REFRACTIVE SURGERY Bilateral     Current Outpatient Medications  Medication Sig Dispense Refill  . acetaminophen (TYLENOL) 500 MG tablet Take 500 mg by mouth every 6 (six) hours as needed for moderate pain.     . ADDERALL XR 10 MG 24 hr capsule Take 10 mg by mouth daily.  0  . fexofenadine (ALLEGRA) 180 MG tablet Take 180 mg by mouth daily.    Marland Kitchen gabapentin (NEURONTIN) 300 MG capsule Take one po qAM, one po qPM and teo po qHS (Patient taking differently: Take 300 mg by mouth 4 (four) times daily. ) 120 capsule 11  . omeprazole (PRILOSEC) 40 MG capsule Take 1 capsule (40 mg total) by mouth daily. 30 capsule 1  . VYVANSE 20 MG capsule Take 1 capsule by mouth every morning.     No current facility-administered medications for this visit.     Allergies as of 08/10/2019 - Review Complete 08/10/2019  Allergen Reaction Noted  . Prednisone Other (See Comments) 03/10/2018    Family History  Problem Relation Age of Onset  . Alcohol abuse Mother   . Alcohol abuse Father   . Kidney disease Father   . Alcohol abuse Maternal Grandmother   . Alcohol abuse Maternal Grandfather   . Stroke Paternal Grandmother   . Congestive Heart Failure Paternal Grandmother   . Alcohol abuse Paternal Grandmother   . Alcohol abuse Paternal Grandfather   . ADD / ADHD Brother   . ADD / ADHD Brother     Social History   Socioeconomic History  . Marital status: Married    Spouse name: Not on file  .  Number of children: 2  . Years of education: Not on file  . Highest education level: Not on file  Occupational History  . Occupation: computer work  Scientific laboratory technician  . Financial resource strain: Not on file  . Food insecurity    Worry: Not on file    Inability: Not on file  . Transportation needs    Medical: Not on file    Non-medical: Not on file  Tobacco Use  . Smoking status: Never Smoker  . Smokeless tobacco: Never Used  Substance and Sexual Activity  . Alcohol use: No  . Drug use: No  . Sexual activity: Not on file  Lifestyle  . Physical activity    Days per week: Not on file  Minutes per session: Not on file  . Stress: Not on file  Relationships  . Social Herbalist on phone: Not on file    Gets together: Not on file    Attends religious service: Not on file    Active member of club or organization: Not on file    Attends meetings of clubs or organizations: Not on file    Relationship status: Not on file  . Intimate partner violence    Fear of current or ex partner: Not on file    Emotionally abused: Not on file    Physically abused: Not on file    Forced sexual activity: Not on file  Other Topics Concern  . Not on file  Social History Narrative   Married, 1 child and 1 on the way.   Relocated from [facility] to [facility] 08/2015.   Educ: college at [employer].   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   No T/A/Ds.     Physical Exam: Temp 98.5 F (36.9 C)   Ht 5' 11"  (1.803 m) Comment: height measured without shoes  Wt 156 lb 4 oz (70.9 kg)   BMI 21.79 kg/m  Constitutional: generally well-appearing Psychiatric: alert and oriented x3 Eyes: extraocular movements intact Mouth: oral pharynx moist, no lesions Neck: supple no lymphadenopathy Cardiovascular: heart regular rate and rhythm Lungs: clear to auscultation bilaterally Abdomen: soft, nontender, nondistended, no obvious ascites, no peritoneal signs, normal bowel sounds Extremities: no  lower extremity edema bilaterally Skin: no lesions on visible extremities   Assessment and plan: 38 y.o. male with epigastric burning, long history of NSAID use  I think this is either significant GERD or peptic, also related disease of the stomach from significant previous NSAID use.  He is going to stay on his 40 mg of Prilosec taking it shortly for breakfast every morning.  He will resume Pepcid and take the 20 mg Pepcid at bedtime every night.  We will proceed with EGD at his soonest convenience.  He tells me he is able to lay safely on his left side.   Please see the "Patient Instructions" section for addition details about the plan.   Owens Loffler, MD [facility] 08/10/2019, 1:36 PM  Cc: McGowen, Adrian Blackwater, MD

## 2019-08-10 NOTE — Patient Instructions (Addendum)
You have been scheduled for an endoscopy. Please follow written instructions given to you at your visit today. If you use inhalers (even only as needed), please bring them with you on the day of your procedure. Your physician has requested that you go to www.startemmi.com and enter the access code given to you at your visit today. This web site gives a general overview about your procedure. However, you should still follow specific instructions given to you by our office regarding your preparation for the procedure.  Please purchase the following medications over the counter and take as directed:  Pepcid every night   Please take your omeprazole 30 minutes before your breakfast meal.  If you are age 38 or younger, your body mass index should be between 19-25. Your Body mass index is 21.79 kg/m. If this is out of the aformentioned range listed, please consider follow up with your Primary Care Provider.

## 2019-08-11 ENCOUNTER — Encounter: Payer: Self-pay | Admitting: Family Medicine

## 2019-08-13 ENCOUNTER — Ambulatory Visit (AMBULATORY_SURGERY_CENTER): Payer: BC Managed Care – PPO | Admitting: Gastroenterology

## 2019-08-13 ENCOUNTER — Other Ambulatory Visit: Payer: Self-pay

## 2019-08-13 ENCOUNTER — Encounter: Payer: Self-pay | Admitting: Gastroenterology

## 2019-08-13 VITALS — BP 99/60 | HR 61 | Temp 98.2°F | Resp 9 | Ht 71.0 in | Wt 156.0 lb

## 2019-08-13 DIAGNOSIS — K3189 Other diseases of stomach and duodenum: Secondary | ICD-10-CM

## 2019-08-13 DIAGNOSIS — R1013 Epigastric pain: Secondary | ICD-10-CM | POA: Diagnosis not present

## 2019-08-13 DIAGNOSIS — K2970 Gastritis, unspecified, without bleeding: Secondary | ICD-10-CM

## 2019-08-13 HISTORY — PX: ESOPHAGOGASTRODUODENOSCOPY: SHX1529

## 2019-08-13 MED ORDER — OMEPRAZOLE 40 MG PO CPDR: 40.0000 mg | DELAYED_RELEASE_CAPSULE | Freq: Every day | ORAL | 11 refills | Status: DC

## 2019-08-13 MED ORDER — SODIUM CHLORIDE 0.9 % IV SOLN: 500.0000 mL | Freq: Once | INTRAVENOUS | Status: DC

## 2019-08-13 NOTE — Progress Notes (Signed)
PT taken to PACU. Monitors in place. VSS. Report given to RN. 

## 2019-08-13 NOTE — Patient Instructions (Signed)
Try to avoid NSAIDS, Aleve, Ibuprofen.  You may continue your celebrex per Dr. Ardis Hughs.  Take your medicine as directed every day.  Thank-you for Choosing [place] for your medical needs today.  YOU HAD AN ENDOSCOPIC PROCEDURE TODAY AT [facility]:   Refer to the procedure report that was given to you for any specific questions about what was found during the examination.  If the procedure report does not answer your questions, please call your gastroenterologist to clarify.  If you requested that your care partner not be given the details of your procedure findings, then the procedure report has been included in a sealed envelope for you to review at your convenience later.  YOU SHOULD EXPECT: Some feelings of bloating in the abdomen. Passage of more gas than usual.  Walking can help get rid of the air that was put into your GI tract during the procedure and reduce the bloating.   Please Note:  You might notice some irritation and congestion in your nose or some drainage.  This is from the oxygen used during your procedure.  There is no need for concern and it should clear up in a day or so.  SYMPTOMS TO REPORT IMMEDIATELY:   Following upper endoscopy (EGD)  Vomiting of blood or coffee ground material  New chest pain or pain under the shoulder blades  Painful or persistently difficult swallowing  New shortness of breath  Fever of 100F or higher  Black, tarry-looking stools  For urgent or emergent issues, a gastroenterologist can be reached at any hour by calling [phone].   DIET:  We do recommend a small meal at first, but then you may proceed to your regular diet.  Drink plenty of fluids but you should avoid alcoholic beverages for 24 hours.  ACTIVITY:  You should plan to take it easy for the rest of today and you should NOT DRIVE or use heavy machinery until tomorrow (because of the sedation medicines used during the test).    FOLLOW UP: Our staff will call the number  listed on your records 48-72 hours following your procedure to check on you and address any questions or concerns that you may have regarding the information given to you following your procedure. If we do not reach you, we will leave a message.  We will attempt to reach you two times.  During this call, we will ask if you have developed any symptoms of COVID 19. If you develop any symptoms (ie: fever, flu-like symptoms, shortness of breath, cough etc.) before then, please call [phone].  If you test positive for Covid 19 in the 2 weeks post procedure, please call and report this information to [place].    If any biopsies were taken you will be contacted by phone or by letter within the next 1-3 weeks.  Please call us at [phone] if you have not heard about the biopsies in 3 weeks.    SIGNATURES/CONFIDENTIALITY: You and/or your care partner have signed paperwork which will be entered into your electronic medical record.  These signatures attest to the fact that that the information above on your After Visit Summary has been reviewed and is understood.  Full responsibility of the confidentiality of this discharge information lies with you and/or your care-partner.

## 2019-08-13 NOTE — Op Note (Signed)
[place] Patient Name: Bernard Dalton Procedure Date: 08/13/2019 9:43 AM MRN: [id number] Endoscopist: Milus Banister , MD Age: 38 Referring MD:  Date of Birth: 1981-05-14 Gender: Male Account #: [id number] Procedure:                Upper GI endoscopy Indications:              epigastric burning, previous extensive NSAID use Medicines:                Monitored Anesthesia Care Procedure:                Pre-Anesthesia Assessment:                           - Prior to the procedure, a History and Physical                            was performed, and patient medications and                            allergies were reviewed. The patient's tolerance of                            previous anesthesia was also reviewed. The risks                            and benefits of the procedure and the sedation                            options and risks were discussed with the patient.                            All questions were answered, and informed consent                            was obtained. Prior Anticoagulants: The patient has                            taken no previous anticoagulant or antiplatelet                            agents. ASA Grade Assessment: II - A patient with                            mild systemic disease. After reviewing the risks                            and benefits, the patient was deemed in                            satisfactory condition to undergo the procedure.                           After obtaining informed consent, the endoscope was  passed under direct vision. Throughout the                            procedure, the patient's blood pressure, pulse, and                            oxygen saturations were monitored continuously. The                            Endoscope was introduced through the mouth, and                            advanced to the second part of duodenum. The upper                            GI  endoscopy was accomplished without difficulty.                            The patient tolerated the procedure well. Scope In: Scope Out: Findings:                 Mild inflammation characterized by erythema,                            friability and granularity was found in the gastric                            antrum. Biopsies were taken with a cold forceps for                            histology.                           The exam was otherwise without abnormality. Complications:            No immediate complications. Estimated blood loss:                            None. Estimated Blood Loss:     Estimated blood loss: none. Impression:               - Gastritis. Biopsied.                           - The examination was otherwise normal. Recommendation:           - Patient has a contact number available for                            emergencies. The signs and symptoms of potential                            delayed complications were discussed with the                            patient. Return to normal activities tomorrow.  Written discharge instructions were provided to the                            patient.                           - Resume previous diet.                           - Continue present medications. Prilosec 40mg                             shortly before breakfast and pepcid 20mg  at bedtime                            nightly.                           - Await pathology results. Milus Banister, MD 08/13/2019 9:55:12 AM This report has been signed electronically.

## 2019-08-13 NOTE — Progress Notes (Signed)
Called to room to assist during endoscopic procedure.  Patient ID and intended procedure confirmed with present staff. Received instructions for my participation in the procedure from the performing physician.  

## 2019-08-14 DIAGNOSIS — J3089 Other allergic rhinitis: Secondary | ICD-10-CM | POA: Diagnosis not present

## 2019-08-14 DIAGNOSIS — J3081 Allergic rhinitis due to animal (cat) (dog) hair and dander: Secondary | ICD-10-CM | POA: Diagnosis not present

## 2019-08-14 DIAGNOSIS — J301 Allergic rhinitis due to pollen: Secondary | ICD-10-CM | POA: Diagnosis not present

## 2019-08-15 ENCOUNTER — Telehealth: Payer: Self-pay

## 2019-08-15 NOTE — Telephone Encounter (Signed)
Left voice mail

## 2019-08-16 ENCOUNTER — Telehealth: Payer: Self-pay | Admitting: *Deleted

## 2019-08-16 ENCOUNTER — Encounter: Payer: Self-pay | Admitting: Family Medicine

## 2019-08-16 DIAGNOSIS — J3089 Other allergic rhinitis: Secondary | ICD-10-CM | POA: Diagnosis not present

## 2019-08-16 NOTE — Telephone Encounter (Signed)
  Follow up Call-  Call back number 08/13/2019  Post procedure Call Back phone  # [phone]  Permission to leave phone message Yes  Some recent data might be hidden     Patient questions:  Do you have a fever, pain , or abdominal swelling? No. Pain Score  0 *  Have you tolerated food without any problems? Yes.    Have you been able to return to your normal activities? Yes.    Do you have any questions about your discharge instructions: Diet   No. Medications  No. Follow up visit  No.  Do you have questions or concerns about your Care? No.  Actions: * If pain score is 4 or above: No action needed, pain <4.   1. Have you developed a fever since your procedure? no  2.   Have you had an respiratory symptoms (SOB or cough) since your procedure? no  3.   Have you tested positive for COVID 19 since your procedure no  4.   Have you had any family members/close contacts diagnosed with the COVID 19 since your procedure?  no   If yes to any of these questions please route to Joylene John, RN and Alphonsa Gin, Therapist, sports.

## 2019-08-17 DIAGNOSIS — M40294 Other kyphosis, thoracic region: Secondary | ICD-10-CM | POA: Diagnosis not present

## 2019-08-23 DIAGNOSIS — F419 Anxiety disorder, unspecified: Secondary | ICD-10-CM | POA: Diagnosis not present

## 2019-08-23 DIAGNOSIS — J3081 Allergic rhinitis due to animal (cat) (dog) hair and dander: Secondary | ICD-10-CM | POA: Diagnosis not present

## 2019-08-23 DIAGNOSIS — J3089 Other allergic rhinitis: Secondary | ICD-10-CM | POA: Diagnosis not present

## 2019-08-23 DIAGNOSIS — J301 Allergic rhinitis due to pollen: Secondary | ICD-10-CM | POA: Diagnosis not present

## 2019-08-24 IMAGING — DX PORTABLE CHEST - 1 VIEW
1 series · 1 of 1 positions shown · non-contrast
Comparison: July 18, 2019

CLINICAL DATA: Follow-up evaluation for pleural effusion.

EXAM:
PORTABLE CHEST 1 VIEW

[chest ap]
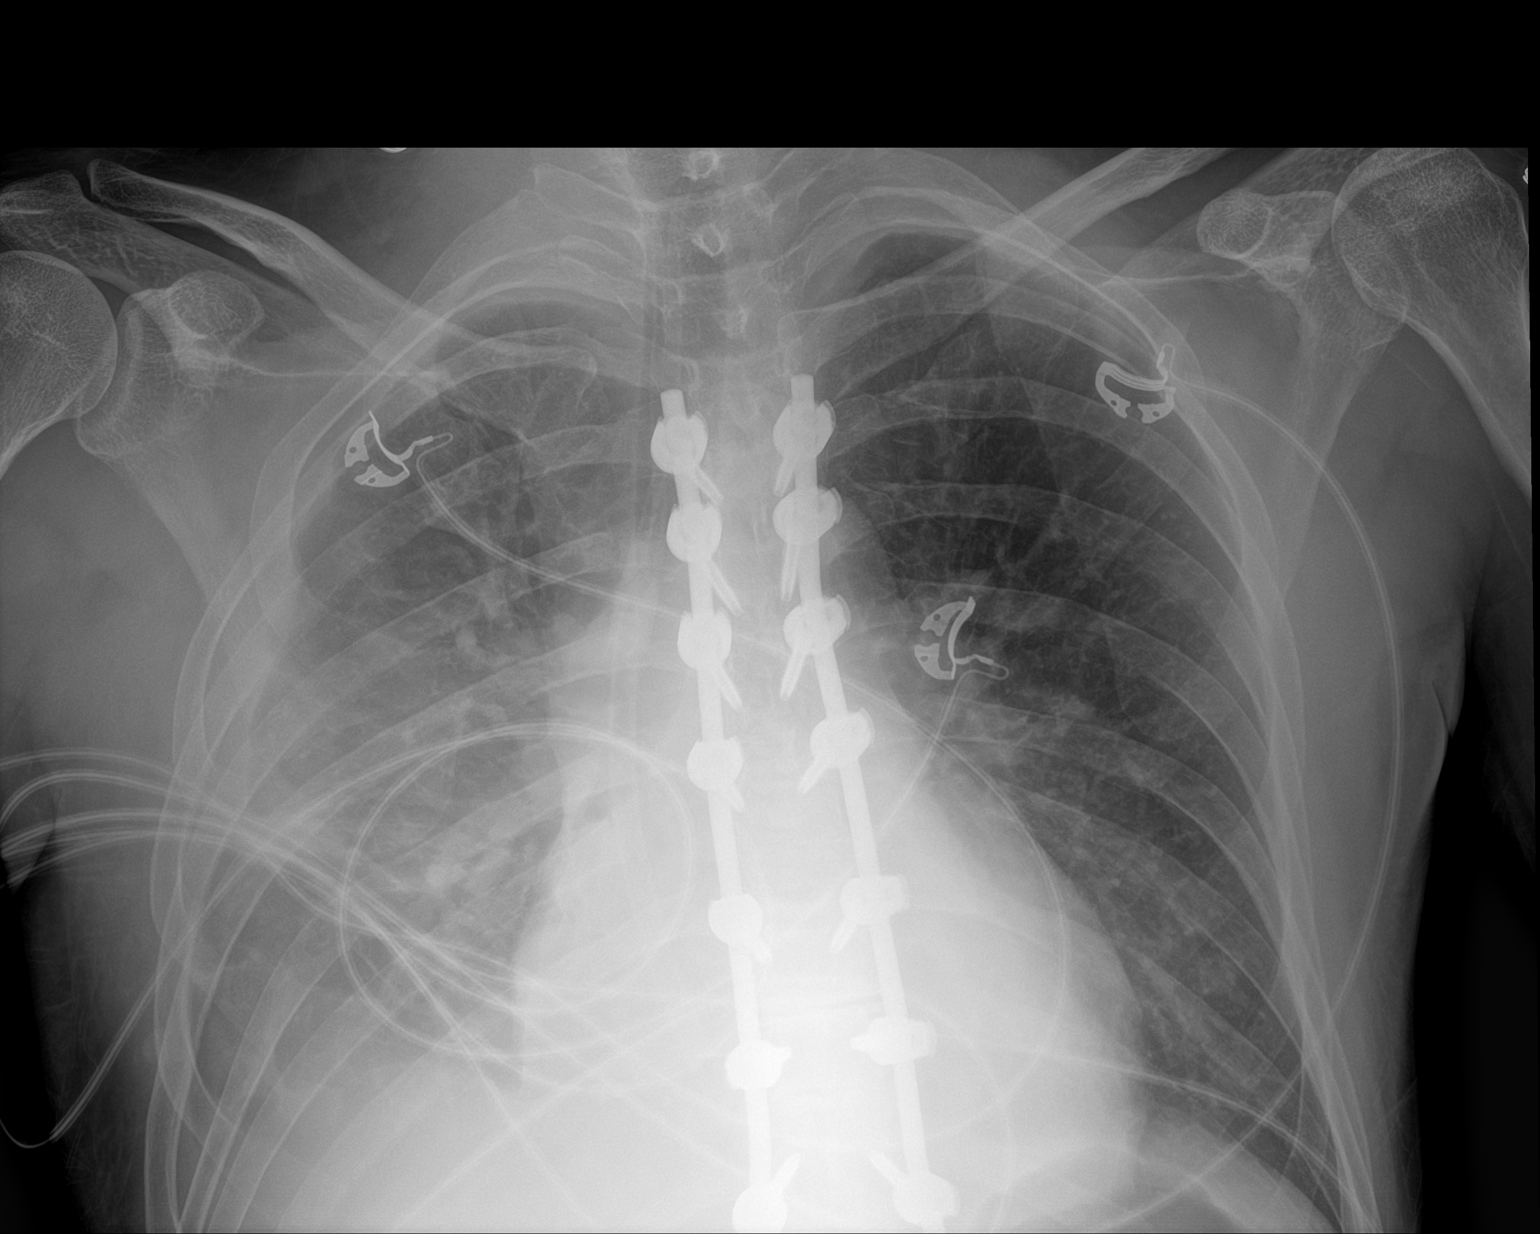

[1 of 1 positions shown; findings below may reference images not displayed]

FINDINGS: A right-sided pleural effusion with underlying opacity remains,
unchanged. Surgical hardware in the spine is stable. The left lung
remains clear. No other interval changes.
IMPRESSION: Stable right layering pleural effusion with underlying atelectasis.
No interval changes.

## 2019-08-27 DIAGNOSIS — F419 Anxiety disorder, unspecified: Secondary | ICD-10-CM | POA: Diagnosis not present

## 2019-08-27 DIAGNOSIS — G43009 Migraine without aura, not intractable, without status migrainosus: Secondary | ICD-10-CM | POA: Diagnosis not present

## 2019-08-27 DIAGNOSIS — M4005 Postural kyphosis, thoracolumbar region: Secondary | ICD-10-CM | POA: Diagnosis not present

## 2019-08-27 DIAGNOSIS — J3081 Allergic rhinitis due to animal (cat) (dog) hair and dander: Secondary | ICD-10-CM | POA: Diagnosis not present

## 2019-08-27 DIAGNOSIS — J301 Allergic rhinitis due to pollen: Secondary | ICD-10-CM | POA: Diagnosis not present

## 2019-08-27 DIAGNOSIS — J3089 Other allergic rhinitis: Secondary | ICD-10-CM | POA: Diagnosis not present

## 2019-08-27 DIAGNOSIS — F329 Major depressive disorder, single episode, unspecified: Secondary | ICD-10-CM | POA: Diagnosis not present

## 2019-08-27 DIAGNOSIS — G4440 Drug-induced headache, not elsewhere classified, not intractable: Secondary | ICD-10-CM | POA: Diagnosis not present

## 2019-08-27 DIAGNOSIS — G43709 Chronic migraine without aura, not intractable, without status migrainosus: Secondary | ICD-10-CM | POA: Diagnosis not present

## 2019-08-30 DIAGNOSIS — F419 Anxiety disorder, unspecified: Secondary | ICD-10-CM | POA: Diagnosis not present

## 2019-08-30 DIAGNOSIS — M791 Myalgia, unspecified site: Secondary | ICD-10-CM | POA: Diagnosis not present

## 2019-08-30 DIAGNOSIS — M542 Cervicalgia: Secondary | ICD-10-CM | POA: Diagnosis not present

## 2019-08-30 DIAGNOSIS — M5481 Occipital neuralgia: Secondary | ICD-10-CM | POA: Diagnosis not present

## 2019-09-04 DIAGNOSIS — J3089 Other allergic rhinitis: Secondary | ICD-10-CM | POA: Diagnosis not present

## 2019-09-04 DIAGNOSIS — J3081 Allergic rhinitis due to animal (cat) (dog) hair and dander: Secondary | ICD-10-CM | POA: Diagnosis not present

## 2019-09-04 DIAGNOSIS — J301 Allergic rhinitis due to pollen: Secondary | ICD-10-CM | POA: Diagnosis not present

## 2019-09-06 DIAGNOSIS — F419 Anxiety disorder, unspecified: Secondary | ICD-10-CM | POA: Diagnosis not present

## 2019-09-10 DIAGNOSIS — J301 Allergic rhinitis due to pollen: Secondary | ICD-10-CM | POA: Diagnosis not present

## 2019-09-10 DIAGNOSIS — J3089 Other allergic rhinitis: Secondary | ICD-10-CM | POA: Diagnosis not present

## 2019-09-10 DIAGNOSIS — J3081 Allergic rhinitis due to animal (cat) (dog) hair and dander: Secondary | ICD-10-CM | POA: Diagnosis not present

## 2019-09-12 DIAGNOSIS — J301 Allergic rhinitis due to pollen: Secondary | ICD-10-CM | POA: Diagnosis not present

## 2019-09-12 DIAGNOSIS — J3081 Allergic rhinitis due to animal (cat) (dog) hair and dander: Secondary | ICD-10-CM | POA: Diagnosis not present

## 2019-09-12 DIAGNOSIS — J3089 Other allergic rhinitis: Secondary | ICD-10-CM | POA: Diagnosis not present

## 2019-09-13 DIAGNOSIS — F419 Anxiety disorder, unspecified: Secondary | ICD-10-CM | POA: Diagnosis not present

## 2019-09-17 DIAGNOSIS — J301 Allergic rhinitis due to pollen: Secondary | ICD-10-CM | POA: Diagnosis not present

## 2019-09-17 DIAGNOSIS — J3081 Allergic rhinitis due to animal (cat) (dog) hair and dander: Secondary | ICD-10-CM | POA: Diagnosis not present

## 2019-09-17 DIAGNOSIS — M5481 Occipital neuralgia: Secondary | ICD-10-CM | POA: Diagnosis not present

## 2019-09-17 DIAGNOSIS — M791 Myalgia, unspecified site: Secondary | ICD-10-CM | POA: Diagnosis not present

## 2019-09-17 DIAGNOSIS — J3089 Other allergic rhinitis: Secondary | ICD-10-CM | POA: Diagnosis not present

## 2019-09-17 DIAGNOSIS — M542 Cervicalgia: Secondary | ICD-10-CM | POA: Diagnosis not present

## 2019-09-19 DIAGNOSIS — J3089 Other allergic rhinitis: Secondary | ICD-10-CM | POA: Diagnosis not present

## 2019-09-20 ENCOUNTER — Other Ambulatory Visit: Payer: Self-pay

## 2019-09-20 ENCOUNTER — Ambulatory Visit (INDEPENDENT_AMBULATORY_CARE_PROVIDER_SITE_OTHER): Payer: BC Managed Care – PPO

## 2019-09-20 ENCOUNTER — Ambulatory Visit (INDEPENDENT_AMBULATORY_CARE_PROVIDER_SITE_OTHER): Payer: BC Managed Care – PPO | Admitting: Pulmonary Disease

## 2019-09-20 ENCOUNTER — Encounter: Payer: Self-pay | Admitting: Pulmonary Disease

## 2019-09-20 VITALS — BP 124/66 | HR 72 | Ht 72.0 in | Wt 156.6 lb

## 2019-09-20 DIAGNOSIS — J90 Pleural effusion, not elsewhere classified: Secondary | ICD-10-CM

## 2019-09-20 DIAGNOSIS — F419 Anxiety disorder, unspecified: Secondary | ICD-10-CM | POA: Diagnosis not present

## 2019-09-20 NOTE — Progress Notes (Signed)
Subjective:     Patient ID: Bernard Dalton, male   DOB: [birth year], 38 y.o.   MRN: [id number]  Patient being seen for shortness of breath  He was recently in the hospital  Had back surgery August 3 for severe kyphoscoliosis Surgery lasted longer than was obstipated Developed a large pleural effusion on the right side  Had to wear a back brace for a while  Generally better but still feeling short of breath with activity  Chest is numb  Does get some lightheadedness, wooziness on getting up on some occasions  No underlying lung disease Never smoked  Works with computers    Past Medical History:  Diagnosis Date  . ADD (attention deficit disorder)    Started seeing Dr. Johnnye Sima at [facility] 07/2019->adderall changed to vyvanse.  . Allergic rhinitis    + allerg conjunctivitis.  Immunotherapy started 2019  . Anxiety and depression   . Blood transfusion without reported diagnosis 07/16/2019  . Chronic prostatitis    chronic irritative voiding symptoms: pelvic PT via [facility] helping as of 07/2016  . Concussion   . DDD (degenerative disc disease), cervical 08/2018   (Dr. Maia Petties):  C5-C6 --ESI 25% improvement in fall 2019.  Pt desiring C spine surgery to see if this alleviates his symptom complex..  . Diverticulosis    with 'itis per pt report; however, review of old record CT reports shows mild SB enteritis with mesenteric adenopathy 06/2015; then 11/2014 CT abd/pelv done for urinary frequency and hematuria showed NORMAL abd/pelv  . Elevation of optic disc, bilateral 2019   Normal variant per ophtho--Dr. Larose Kells.  Marland Kitchen Epigastric burning sensation 2020   pt to get EGD as of 08/10/19 GI eval  . 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 papilledema on optho exam.  Dr. [name] with neuro saw him 04/2018, no papilledema noted and opening CSF pressure was nl.  CSF labs nl.  ESR and  ANA normal.  No clear reason for HA's found.  Imipr trial no help.  Trig pt inj's in occipi mm's 05/2018 by Dr. [name]. no help.Topamax trial 02/2019.  Marland Kitchen Headache syndrome 02/2018   As of 07/2018, pt has plan to see Dr. [name], NS/spine specialist as next step (his HAs are likely cervicogenic).  Dr. Maia Petties did C7-T1 ESI on 09/11/18--25% improvement.   . IBS (irritable bowel syndrome)    diarr predom as per old PCP records  . Nephrolithiasis    Nonobstructive, noted on CT (1-2 mm)-has never passed a stone that he knows of.  . Panic   . 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 Seasonal allergies   . Spondylosis without myelopathy or radiculopathy, thoracic region 2019  . Vision abnormalities   . Vitamin B12 deficiency 05/2019   intrinsic factor ab NEG. B12 normalized with oral B12   Social History   Socioeconomic History  . Marital status: Married    Spouse name: Not on file  . Number of children: 2  . Years of education: Not on file  . Highest education level: Not on file  Occupational History  . Occupation: computer work  Scientific laboratory technician  . Financial resource strain: Not on file  . Food insecurity    Worry: Not on file    Inability: Not on file  . Transportation needs    Medical: Not on file    Non-medical: Not on file  Tobacco Use  .  Smoking status: Never Smoker  . Smokeless tobacco: Never Used  Substance and Sexual Activity  . Alcohol use: No  . Drug use: No  . Sexual activity: Not on file  Lifestyle  . Physical activity    Days per week: Not on file    Minutes per session: Not on file  . Stress: Not on file  Relationships  . Social Herbalist on phone: Not on file    Gets together: Not on file    Attends religious service: Not on file    Active member of club or organization: Not on file    Attends meetings of clubs or organizations: Not on file    Relationship status: Not on file  . Intimate partner violence    Fear of current or ex  partner: Not on file    Emotionally abused: Not on file    Physically abused: Not on file    Forced sexual activity: Not on file  Other Topics Concern  . Not on file  Social History Narrative   Married, 1 child and 1 on the way.   Relocated from [facility] to [facility] 08/2015.   Educ: college at [employer].   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   No T/A/Ds.   Family History  Problem Relation Age of Onset  . Alcohol abuse Mother   . Alcohol abuse Father   . Kidney disease Father   . Alcohol abuse Maternal Grandmother   . Alcohol abuse Maternal Grandfather   . Stroke Paternal Grandmother   . Congestive Heart Failure Paternal Grandmother   . Alcohol abuse Paternal Grandmother   . Alcohol abuse Paternal Grandfather   . ADD / ADHD Brother   . ADD / ADHD Brother   . Stomach cancer Neg Hx   . Colon cancer Neg Hx   . Esophageal cancer Neg Hx   . Rectal cancer Neg Hx      Review of Systems  Constitutional: Negative for fever and unexpected weight change.  HENT: Negative for congestion, dental problem, ear pain, nosebleeds, postnasal drip, rhinorrhea, sinus pressure, sneezing, sore throat and trouble swallowing.   Eyes: Negative for redness and itching.  Respiratory: Positive for shortness of breath. Negative for cough, chest tightness and wheezing.   Cardiovascular: Negative for palpitations and leg swelling.  Gastrointestinal: Negative for nausea and vomiting.  Genitourinary: Negative for dysuria.  Musculoskeletal: Negative for joint swelling.  Skin: Negative for rash.  Allergic/Immunologic: Positive for environmental allergies. Negative for food allergies and immunocompromised state.  Neurological: Positive for headaches.  Hematological: Does not bruise/bleed easily.  Psychiatric/Behavioral: Negative for dysphoric mood. The patient is not nervous/anxious.        Objective:   Physical Exam Constitutional:      Appearance: Normal appearance.  HENT:     Nose: No  congestion.     Mouth/Throat:     Mouth: Mucous membranes are moist.  Eyes:     General:        Right eye: No discharge.        Left eye: No discharge.     Pupils: Pupils are equal, round, and reactive to light.  Neck:     Musculoskeletal: Normal range of motion and neck supple. No neck rigidity.  Cardiovascular:     Rate and Rhythm: Normal rate and regular rhythm.     Pulses: Normal pulses.     Heart sounds: Normal heart sounds. No murmur. No friction rub.  Pulmonary:  Effort: Pulmonary effort is normal. No respiratory distress.     Breath sounds: Normal breath sounds. No stridor. No wheezing or rhonchi.  Abdominal:     General: Abdomen is flat.  Musculoskeletal:        General: No swelling.  Skin:    General: Skin is warm and dry.     Coloration: Skin is not jaundiced or pale.  Neurological:     General: No focal deficit present.     Mental Status: He is alert and oriented to person, place, and time.  Psychiatric:        Mood and Affect: Mood normal.   Recent hospital records reviewed Recent chest x-ray shows large right pleural effusion     Assessment:     Shortness of breath -Likely related to large pleural effusion noted on recent chest x-ray post surgery  Recent back surgery/back fusion -Continues to recover from recent surgery -Still has a lot of discomfort and headaches -Significant numbness of his chest wall  Large pleural effusion    Plan:     Chest x-ray  If persistence of pleural effusion may need thoracentesis If no significant pleural effusion will order an echocardiogram  Has no underlying lung disease or predisposition to lung disease  There may be a competent of significant deconditioning with his protracted recovery from his surgery  Tentatively we will see him back in the office in about 3 months

## 2019-09-20 NOTE — Patient Instructions (Signed)
Recent back surgery weight pleural effusion  We will obtain a chest x-ray today to see if there is any persistence of the effusion  If no significant effusion  Ultrasound of the heart to make sure the heart is fine as this may contribute to shortness of breath  Tentatively, see you back in about 3 months  Graded activity to improve muscle strength

## 2019-09-24 DIAGNOSIS — J3081 Allergic rhinitis due to animal (cat) (dog) hair and dander: Secondary | ICD-10-CM | POA: Diagnosis not present

## 2019-09-24 DIAGNOSIS — J3089 Other allergic rhinitis: Secondary | ICD-10-CM | POA: Diagnosis not present

## 2019-09-24 DIAGNOSIS — J301 Allergic rhinitis due to pollen: Secondary | ICD-10-CM | POA: Diagnosis not present

## 2019-09-25 ENCOUNTER — Encounter: Payer: Self-pay | Admitting: Family Medicine

## 2019-09-25 ENCOUNTER — Other Ambulatory Visit: Payer: Self-pay

## 2019-09-25 ENCOUNTER — Ambulatory Visit (INDEPENDENT_AMBULATORY_CARE_PROVIDER_SITE_OTHER): Payer: BC Managed Care – PPO | Admitting: Family Medicine

## 2019-09-25 VITALS — BP 120/78 | HR 82 | Temp 98.7°F | Resp 16 | Ht 72.0 in | Wt 157.8 lb

## 2019-09-25 DIAGNOSIS — E538 Deficiency of other specified B group vitamins: Secondary | ICD-10-CM

## 2019-09-25 DIAGNOSIS — Z9289 Personal history of other medical treatment: Secondary | ICD-10-CM

## 2019-09-25 DIAGNOSIS — K2950 Unspecified chronic gastritis without bleeding: Secondary | ICD-10-CM | POA: Diagnosis not present

## 2019-09-25 DIAGNOSIS — Z9889 Other specified postprocedural states: Secondary | ICD-10-CM

## 2019-09-25 DIAGNOSIS — M419 Scoliosis, unspecified: Secondary | ICD-10-CM

## 2019-09-25 DIAGNOSIS — D649 Anemia, unspecified: Secondary | ICD-10-CM

## 2019-09-25 LAB — CBC
HCT: 42.6 % (ref 39.0–52.0)
Hemoglobin: 14.4 g/dL (ref 13.0–17.0)
MCHC: 33.7 g/dL (ref 30.0–36.0)
MCV: 87 fl (ref 78.0–100.0)
Platelets: 207 10*3/uL (ref 150.0–400.0)
RBC: 4.9 Mil/uL (ref 4.22–5.81)
RDW: 13.5 % (ref 11.5–15.5)
WBC: 5.3 10*3/uL (ref 4.0–10.5)

## 2019-09-25 LAB — VITAMIN B12: Vitamin B-12: 921 pg/mL — ABNORMAL HIGH (ref 211–911)

## 2019-09-25 NOTE — Progress Notes (Signed)
OFFICE VISIT  09/25/2019   CC:  Chief Complaint  Patient presents with  . Follow-up    RCI, pt is not fasting   HPI:    Patient is a 38 y.o. Caucasian male who presents for 3 mo f/u vit B12 def, chronic gastritis  He underwent T/L fusion surgery for scoliosis on 07/16/19.  This has been healing fine, has back brace off now.  Says back pain and secondary HA's have not improved yet but PT is planned. He did have to have post-surg anemia, had to have transfusion of pRBCs (2 units), stayed 1 week in ICU post-op. Pleural effusion: came out of surgery with this, saw pulm MD there.  CXR to follow this was done 4-5 d/a and he asks about results: may need drainage if not resolving appropriately.  This x-ray on 09/20/19 shows that the effusion has completely resolved.  Epigastric pain: EGD 08/13/19 showed gastritis.  He is taking omeprazole 34m qd and pepcid qhs and this has helped significantly.   He is tapering off celebrex currently-currently taking 1 qd and will be stopping this in the next few days.  No tylenol. The pain in his back is bearable such that he thinks he will be ok w/out any tyl or NSAIDs. Was on vit b12 sublingual drops, B12 came back up, then he switched to B12 tabs 2 mo ago->need to see if he has absorbed this adequately by recheck b12 today.  ROS: no CP,no wheezing, no cough, no dizziness, no HAs, no rashes, no melena/hematochezia.  No polyuria or polydipsia.  No fevers.    Past Medical History:  Diagnosis Date  . ADD (attention deficit disorder)    [facility] [facility]e [facility]->adderall changed to vyvanse.  . Allergic rhinitis    + allerg conjunctivitis.  Immunotherapy started 2019  . Anxiety and depression   . Blood transfusion without reported diagnosis 07/16/2019  . Chronic prostatitis    chronic irritative voiding symptoms: pelvic PT via [facility] helping as of 07/2016  . Concussion   . DDD (degenerative disc disease),  cervical 08/2018   (Dr. SMaia Petties:  C5-C6 --ESI 25% improvement in fall 2019.  Pt desiring C spine surgery to see if this alleviates his symptom complex..  . Diverticulosis    with 'itis per pt report; however, review of old record CT reports shows mild SB enteritis with mesenteric adenopathy 06/2015; then 11/2014 CT abd/pelv done for urinary frequency and hematuria showed NORMAL abd/pelv  . Elevation of optic disc, bilateral 2019   Normal variant per ophtho--Dr. HLarose Kells  .Marland KitchenEpigastric burning sensation 2020   EGD 08/13/19->gastritis. Continue daily PPI + H2 blocker  . 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 papilledema on optho exam.  Dr. SFelecia Shellingwith neuro saw him 04/2018, no papilledema noted and opening CSF pressure was nl.  CSF labs nl.  ESR and ANA normal.  No clear reason for HA's found.  Imipr trial no help.  Trig pt inj's in occipi mm's 05/2018 by Dr. SFelecia Shelling no help.Topamax trial 02/2019.  .Marland KitchenHeadache syndrome 02/2018   As of 07/2018, pt has plan to see Dr. CPatrice Paradise NS/spine specialist as next step (his HAs are likely cervicogenic).  Dr. SMaia Pettiesdid C7-T1 ESI on 09/11/18--25% improvement.   . IBS (irritable bowel syndrome)    diarr predom as per old PCP records  . Nephrolithiasis    Nonobstructive, noted on CT (1-2 mm)-has  never passed a stone that he knows of.  . Panic   . 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 Seasonal allergies   . Spondylosis without myelopathy or radiculopathy, thoracic region 2019  . Vision abnormalities   . Vitamin B12 deficiency 05/2019   intrinsic factor ab NEG. B12 normalized with oral B12    Past Surgical History:  Procedure Laterality Date  . APPLICATION OF ROBOTIC ASSISTANCE FOR SPINAL PROCEDURE N/A 07/16/2019   Procedure: APPLICATION OF ROBOTIC ASSISTANCE FOR SPINAL PROCEDURE;  Surgeon: Kristeen Miss, MD;  Location: [facility];  Service: Neurosurgery;   Laterality: N/A;  . ESOPHAGOGASTRODUODENOSCOPY  08/13/2019   Antral gastritis; H pylori NEG  . LASIK Bilateral 2011  . POSTERIOR LUMBAR FUSION 4 LEVEL N/A 07/16/2019   Procedure: Thoracic Four to Lumbar Two posterior fusion, Kyphectomy with osteotomies, segmental pedicle screw fixation;  Surgeon: Kristeen Miss, MD;  Location: [facility];  Service: Neurosurgery;  Laterality: N/A;  Thoracic Four to Lumbar Two posterior fusion, Kyphectomy with osteotomies, segmental pedicle screw fixation  . REFRACTIVE SURGERY Bilateral     Outpatient Medications Prior to Visit  Medication Sig Dispense Refill  . celecoxib (CELEBREX) 100 MG capsule Take 100 mg by mouth 2 (two) times daily.    . fexofenadine (ALLEGRA) 180 MG tablet Take 180 mg by mouth daily.    Marland Kitchen gabapentin (NEURONTIN) 300 MG capsule Take one po qAM, one po qPM and teo po qHS (Patient taking differently: Take 300 mg by mouth 4 (four) times daily. ) 120 capsule 11  . omeprazole (PRILOSEC) 40 MG capsule Take 1 capsule (40 mg total) by mouth daily. 30 capsule 11  . VYVANSE 20 MG capsule Take 1 capsule by mouth every morning.     No facility-administered medications prior to visit.     No Known Allergies  ROS As per HPI  PE: Blood pressure 120/78, pulse 82, temperature 98.7 F (37.1 C), temperature source Temporal, resp. rate 16, height 6' (1.829 m), weight 157 lb 12.8 oz (71.6 kg), SpO2 99 %.  Gen: Alert, well appearing.  Patient is oriented to person, place, time, and situation. AFFECT: pleasant, lucid thought and speech. CV: RRR, no m/r/g.   LUNGS: CTA bilat, nonlabored resps, good aeration in all lung fields. Back: well healed surgical scars  LABS:  Lab Results  Component Value Date   VITAMINB12 >1515 (H) 06/22/2019   Lab Results  Component Value Date   WBC 8.3 07/19/2019   HGB 9.4 (L) 07/19/2019   HCT 27.0 (L) 07/19/2019   MCV 89.7 07/19/2019   PLT 97 (L) 07/19/2019     Chemistry      Component Value Date/Time   NA 135  07/19/2019 0113   K 3.8 07/19/2019 0113   CL 103 07/19/2019 0113   CO2 27 07/19/2019 0113   BUN 7 07/19/2019 0113   CREATININE 0.84 07/19/2019 0113   CREATININE 1.04 07/29/2016 0803      Component Value Date/Time   CALCIUM 7.6 (L) 07/19/2019 0113   ALKPHOS 47 07/29/2016 0803   AST 10 07/29/2016 0803   ALT 10 07/29/2016 0803   BILITOT 1.1 07/29/2016 0803      IMPRESSION AND PLAN:  1) Vit B12 deficiency: suspected due to chronic gastritis. Level came up with sublingual b12 drops, but with his switch to b12 pills 2 mo ago we'll recheck B12 level today.  2) Post-operative anemia that required transfusion of pRBCs.  His platelets were around 100K as well. CBC today.  3) Post-op pleural effusion: resolved.  Reassured pt that any breathing heaviness at this time is suspected to be due to the change in his breathing mechanics as a result of his recent scoliosis surgery/fusion.  4) Chronic gastritis: NSAID-induced.  Symptoms MUCH improved with weening off NSAIDs and using daily PPI and H2 blocker.    5) Scoliosis: s/p thoracolumbar fusion surgery 2 mo ago. It will take PT and time to hopefully see the benefits from this regarding his chronic back pain and secondary headaches.  An After Visit Summary was printed and given to the patient.  FOLLOW UP: Return in about 6 months (around 03/25/2020) for annual CPE (fasting).  Signed:  Crissie Sickles, MD           09/25/2019

## 2019-09-27 DIAGNOSIS — F419 Anxiety disorder, unspecified: Secondary | ICD-10-CM | POA: Diagnosis not present

## 2019-10-01 DIAGNOSIS — F902 Attention-deficit hyperactivity disorder, combined type: Secondary | ICD-10-CM | POA: Diagnosis not present

## 2019-10-01 DIAGNOSIS — J3089 Other allergic rhinitis: Secondary | ICD-10-CM | POA: Diagnosis not present

## 2019-10-01 DIAGNOSIS — J301 Allergic rhinitis due to pollen: Secondary | ICD-10-CM | POA: Diagnosis not present

## 2019-10-01 DIAGNOSIS — F419 Anxiety disorder, unspecified: Secondary | ICD-10-CM | POA: Diagnosis not present

## 2019-10-01 DIAGNOSIS — F338 Other recurrent depressive disorders: Secondary | ICD-10-CM | POA: Diagnosis not present

## 2019-10-01 DIAGNOSIS — J3081 Allergic rhinitis due to animal (cat) (dog) hair and dander: Secondary | ICD-10-CM | POA: Diagnosis not present

## 2019-10-01 DIAGNOSIS — Z79899 Other long term (current) drug therapy: Secondary | ICD-10-CM | POA: Diagnosis not present

## 2019-10-08 ENCOUNTER — Encounter: Payer: Self-pay | Admitting: Family Medicine

## 2019-10-08 DIAGNOSIS — J3089 Other allergic rhinitis: Secondary | ICD-10-CM | POA: Diagnosis not present

## 2019-10-08 DIAGNOSIS — J301 Allergic rhinitis due to pollen: Secondary | ICD-10-CM | POA: Diagnosis not present

## 2019-10-08 DIAGNOSIS — J3081 Allergic rhinitis due to animal (cat) (dog) hair and dander: Secondary | ICD-10-CM | POA: Diagnosis not present

## 2019-10-11 DIAGNOSIS — F419 Anxiety disorder, unspecified: Secondary | ICD-10-CM | POA: Diagnosis not present

## 2019-10-17 DIAGNOSIS — M5481 Occipital neuralgia: Secondary | ICD-10-CM | POA: Diagnosis not present

## 2019-10-17 DIAGNOSIS — M542 Cervicalgia: Secondary | ICD-10-CM | POA: Diagnosis not present

## 2019-10-17 DIAGNOSIS — M791 Myalgia, unspecified site: Secondary | ICD-10-CM | POA: Diagnosis not present

## 2019-10-18 DIAGNOSIS — J3089 Other allergic rhinitis: Secondary | ICD-10-CM | POA: Diagnosis not present

## 2019-10-18 DIAGNOSIS — J3081 Allergic rhinitis due to animal (cat) (dog) hair and dander: Secondary | ICD-10-CM | POA: Diagnosis not present

## 2019-10-18 DIAGNOSIS — J301 Allergic rhinitis due to pollen: Secondary | ICD-10-CM | POA: Diagnosis not present

## 2019-10-23 DIAGNOSIS — J301 Allergic rhinitis due to pollen: Secondary | ICD-10-CM | POA: Diagnosis not present

## 2019-10-23 DIAGNOSIS — H1045 Other chronic allergic conjunctivitis: Secondary | ICD-10-CM | POA: Diagnosis not present

## 2019-10-23 DIAGNOSIS — J3081 Allergic rhinitis due to animal (cat) (dog) hair and dander: Secondary | ICD-10-CM | POA: Diagnosis not present

## 2019-10-23 DIAGNOSIS — J3089 Other allergic rhinitis: Secondary | ICD-10-CM | POA: Diagnosis not present

## 2019-10-25 DIAGNOSIS — J3081 Allergic rhinitis due to animal (cat) (dog) hair and dander: Secondary | ICD-10-CM | POA: Diagnosis not present

## 2019-10-25 DIAGNOSIS — F419 Anxiety disorder, unspecified: Secondary | ICD-10-CM | POA: Diagnosis not present

## 2019-10-25 DIAGNOSIS — J301 Allergic rhinitis due to pollen: Secondary | ICD-10-CM | POA: Diagnosis not present

## 2019-10-26 IMAGING — DX DG CHEST 2V
2 series · 2 of 2 positions shown · non-contrast
Comparison: 07/19/2019 chest radiograph.

CLINICAL DATA: Dyspnea.  Follow-up pleural effusion.

EXAM:
CHEST - 2 VIEW

[chest pa]
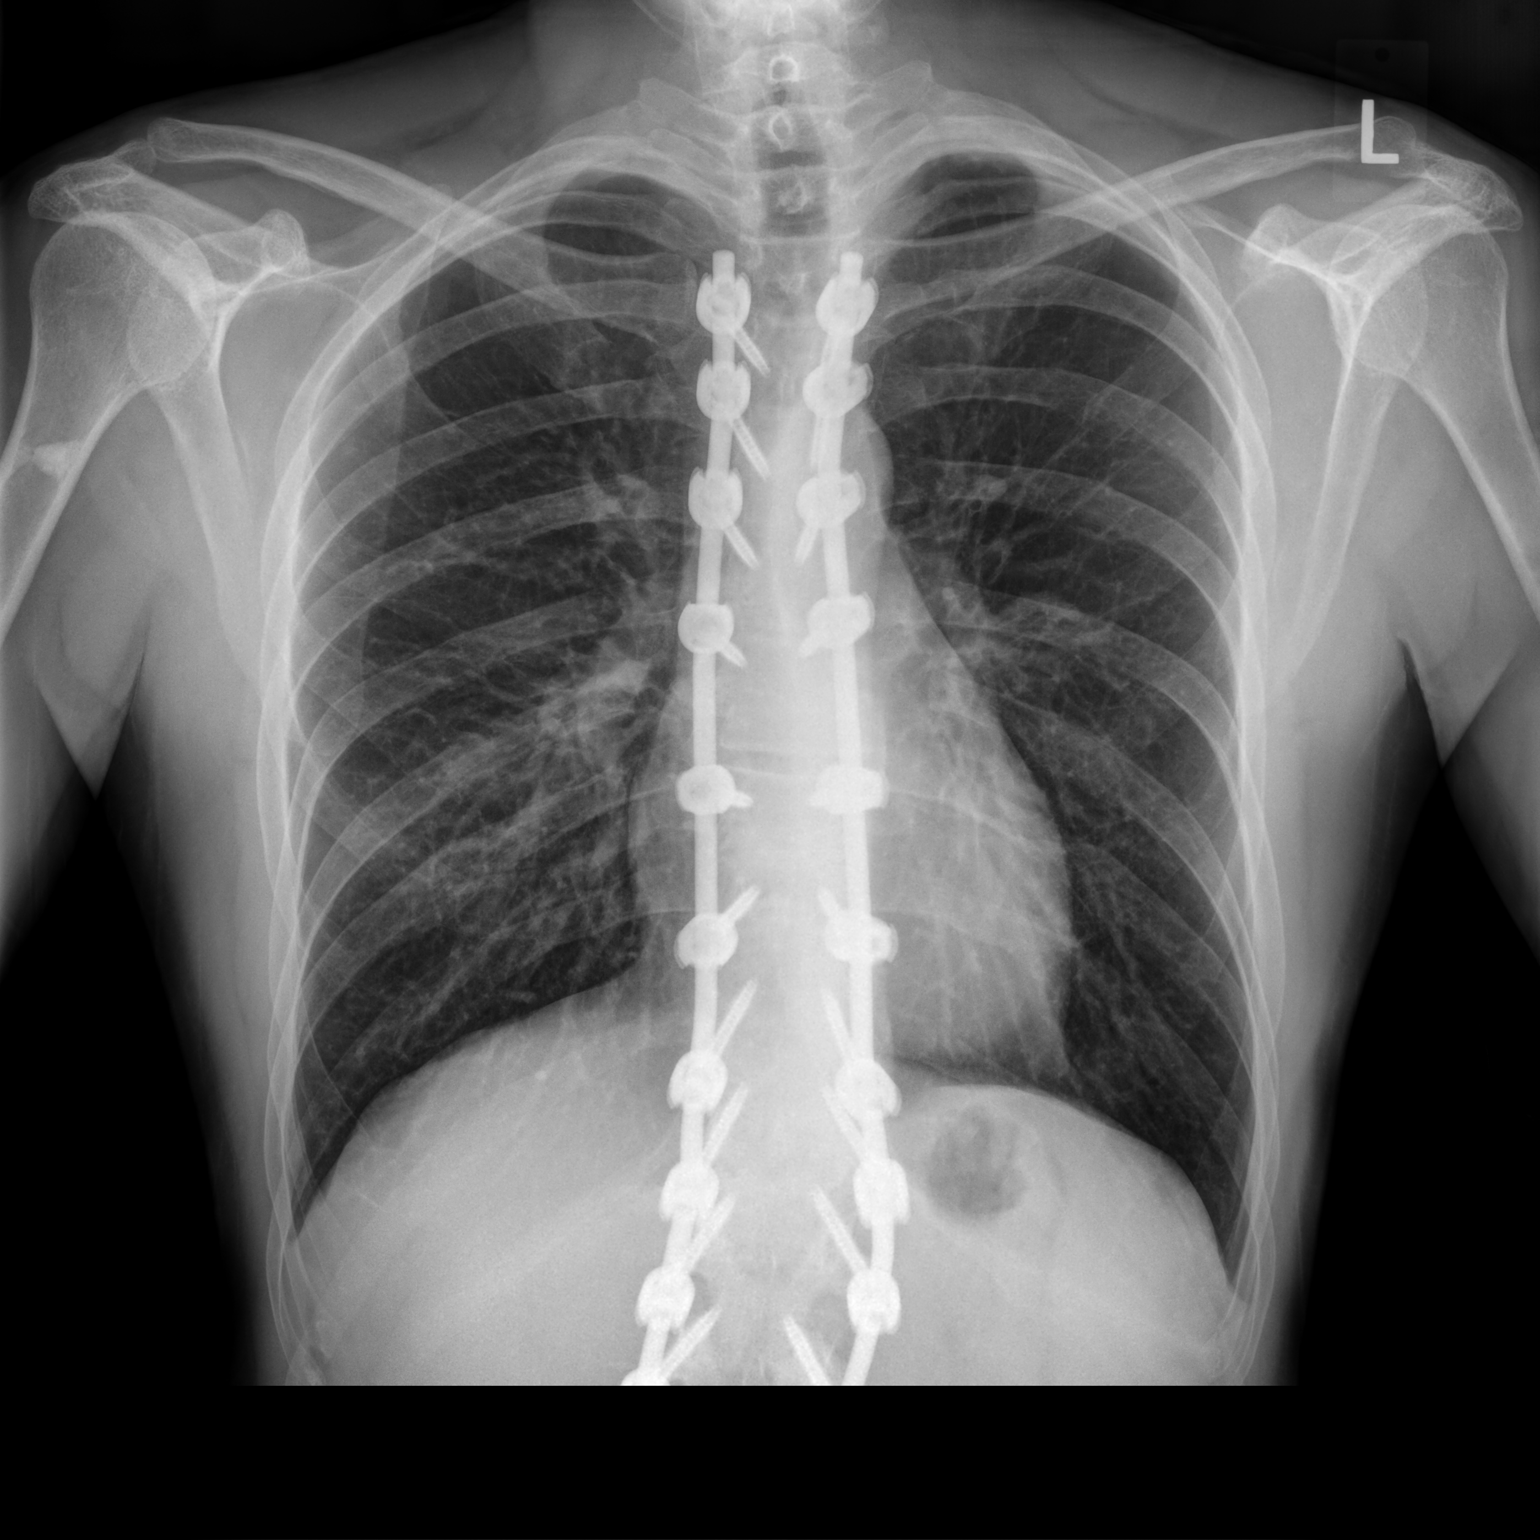

[chest lat]
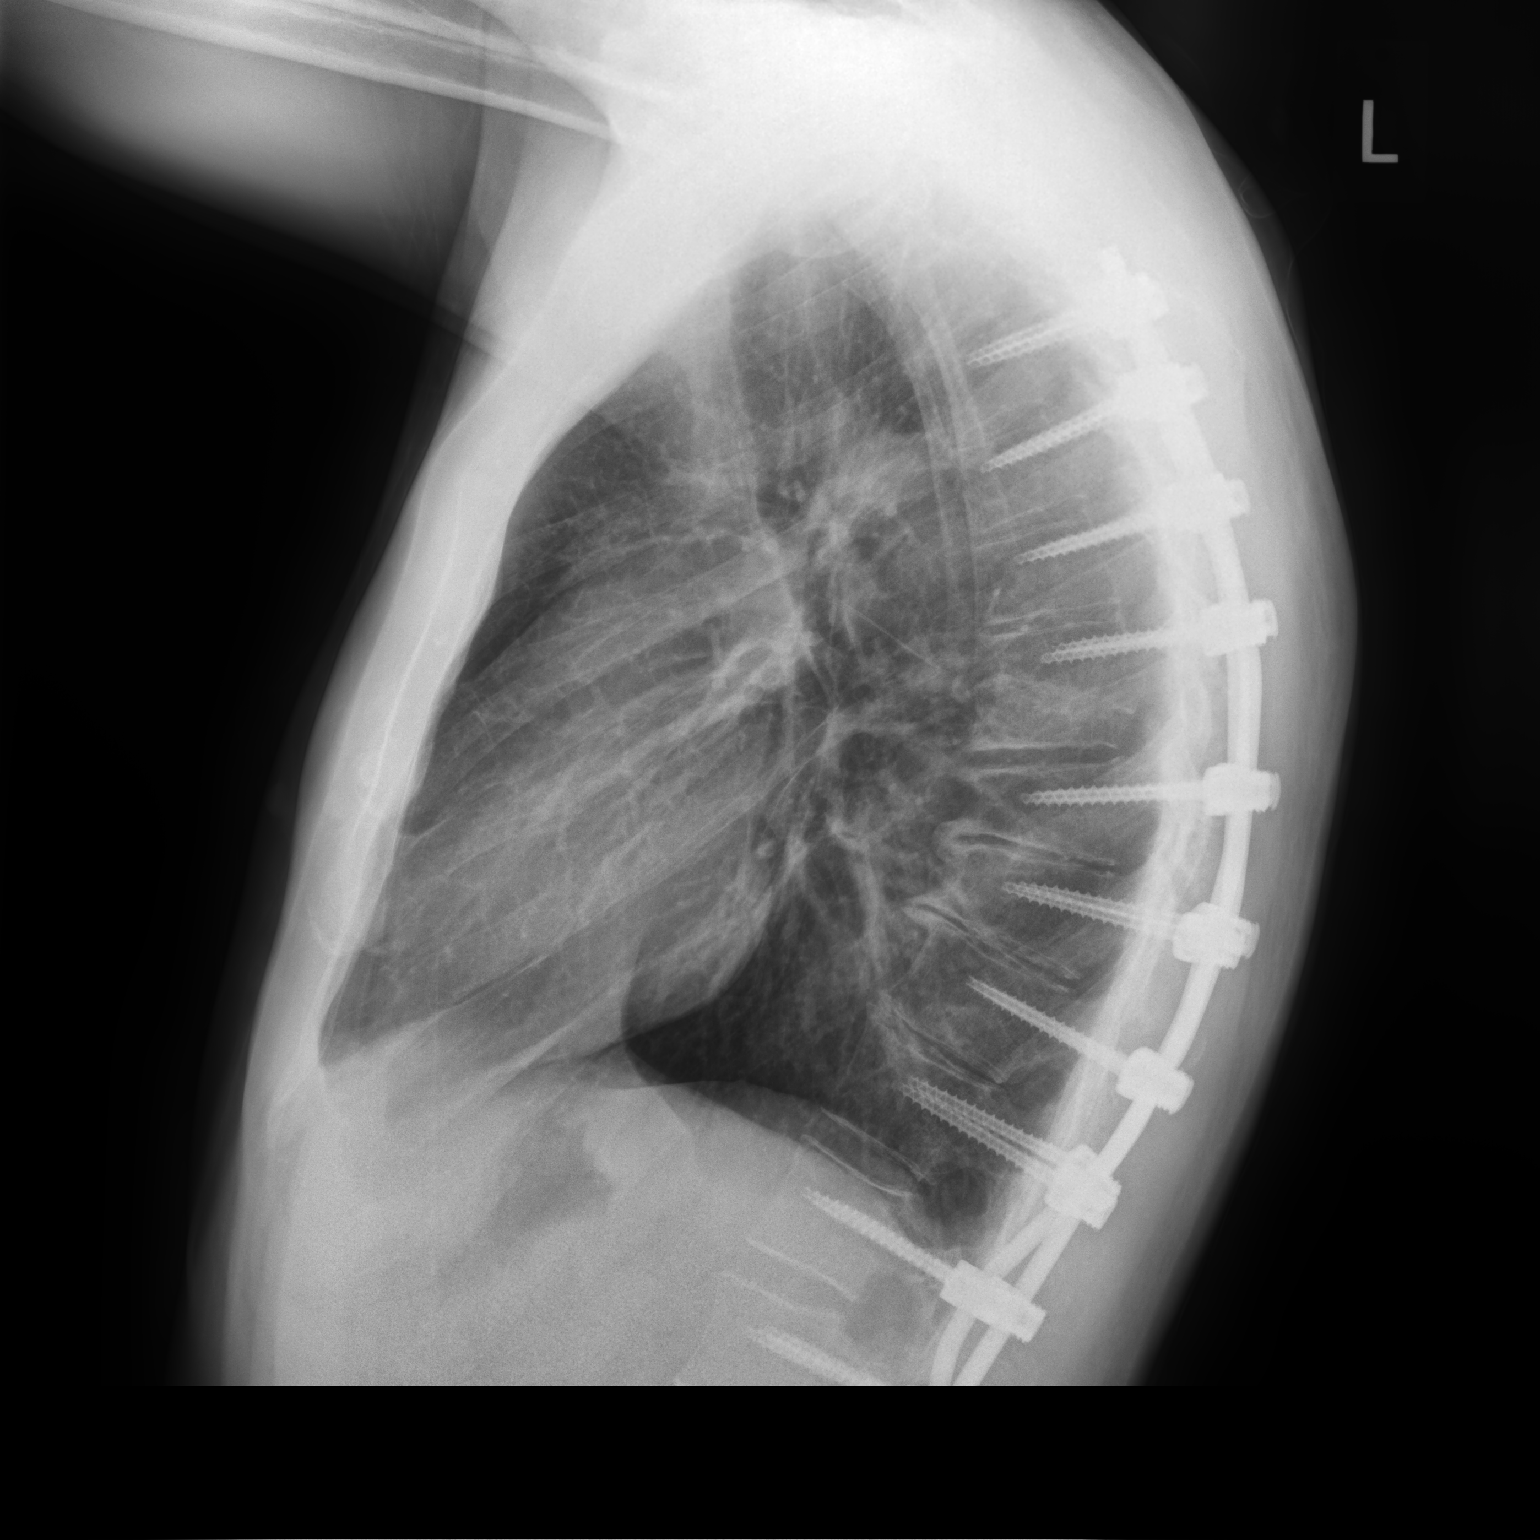

[2 of 2 positions shown; findings below may reference images not displayed]

FINDINGS: Stable bilateral posterior spinal fusion hardware throughout the
thoracic spine. Stable cardiomediastinal silhouette with normal
heart size. No pneumothorax. No pleural effusion. Lungs appear
clear, with no acute consolidative airspace disease and no pulmonary
edema.
IMPRESSION: No active cardiopulmonary disease.  Resolved right pleural effusion.

## 2019-10-30 DIAGNOSIS — J3081 Allergic rhinitis due to animal (cat) (dog) hair and dander: Secondary | ICD-10-CM | POA: Diagnosis not present

## 2019-10-30 DIAGNOSIS — J3089 Other allergic rhinitis: Secondary | ICD-10-CM | POA: Diagnosis not present

## 2019-10-30 DIAGNOSIS — J301 Allergic rhinitis due to pollen: Secondary | ICD-10-CM | POA: Diagnosis not present

## 2019-11-05 DIAGNOSIS — J3089 Other allergic rhinitis: Secondary | ICD-10-CM | POA: Diagnosis not present

## 2019-11-05 DIAGNOSIS — J3081 Allergic rhinitis due to animal (cat) (dog) hair and dander: Secondary | ICD-10-CM | POA: Diagnosis not present

## 2019-11-05 DIAGNOSIS — J301 Allergic rhinitis due to pollen: Secondary | ICD-10-CM | POA: Diagnosis not present

## 2019-11-13 DIAGNOSIS — R519 Headache, unspecified: Secondary | ICD-10-CM | POA: Diagnosis not present

## 2019-11-13 DIAGNOSIS — G8929 Other chronic pain: Secondary | ICD-10-CM | POA: Diagnosis not present

## 2019-11-15 DIAGNOSIS — F419 Anxiety disorder, unspecified: Secondary | ICD-10-CM | POA: Diagnosis not present

## 2019-11-16 DIAGNOSIS — J3081 Allergic rhinitis due to animal (cat) (dog) hair and dander: Secondary | ICD-10-CM | POA: Diagnosis not present

## 2019-11-16 DIAGNOSIS — J3089 Other allergic rhinitis: Secondary | ICD-10-CM | POA: Diagnosis not present

## 2019-11-16 DIAGNOSIS — J301 Allergic rhinitis due to pollen: Secondary | ICD-10-CM | POA: Diagnosis not present

## 2019-11-19 DIAGNOSIS — J3081 Allergic rhinitis due to animal (cat) (dog) hair and dander: Secondary | ICD-10-CM | POA: Diagnosis not present

## 2019-11-19 DIAGNOSIS — J301 Allergic rhinitis due to pollen: Secondary | ICD-10-CM | POA: Diagnosis not present

## 2019-11-19 DIAGNOSIS — J3089 Other allergic rhinitis: Secondary | ICD-10-CM | POA: Diagnosis not present

## 2019-11-21 DIAGNOSIS — M40294 Other kyphosis, thoracic region: Secondary | ICD-10-CM | POA: Diagnosis not present

## 2019-11-21 DIAGNOSIS — J301 Allergic rhinitis due to pollen: Secondary | ICD-10-CM | POA: Diagnosis not present

## 2019-11-21 DIAGNOSIS — Q760 Spina bifida occulta: Secondary | ICD-10-CM | POA: Insufficient documentation

## 2019-11-21 DIAGNOSIS — J3089 Other allergic rhinitis: Secondary | ICD-10-CM | POA: Diagnosis not present

## 2019-11-21 DIAGNOSIS — Q068 Other specified congenital malformations of spinal cord: Secondary | ICD-10-CM | POA: Diagnosis not present

## 2019-11-21 DIAGNOSIS — J3081 Allergic rhinitis due to animal (cat) (dog) hair and dander: Secondary | ICD-10-CM | POA: Diagnosis not present

## 2019-11-26 DIAGNOSIS — J3089 Other allergic rhinitis: Secondary | ICD-10-CM | POA: Diagnosis not present

## 2019-11-26 DIAGNOSIS — J3081 Allergic rhinitis due to animal (cat) (dog) hair and dander: Secondary | ICD-10-CM | POA: Diagnosis not present

## 2019-11-26 DIAGNOSIS — J301 Allergic rhinitis due to pollen: Secondary | ICD-10-CM | POA: Diagnosis not present

## 2019-11-27 DIAGNOSIS — M5126 Other intervertebral disc displacement, lumbar region: Secondary | ICD-10-CM | POA: Diagnosis not present

## 2019-11-27 DIAGNOSIS — M542 Cervicalgia: Secondary | ICD-10-CM | POA: Diagnosis not present

## 2019-11-27 DIAGNOSIS — Q068 Other specified congenital malformations of spinal cord: Secondary | ICD-10-CM | POA: Diagnosis not present

## 2019-11-27 DIAGNOSIS — M5481 Occipital neuralgia: Secondary | ICD-10-CM | POA: Diagnosis not present

## 2019-11-27 DIAGNOSIS — R519 Headache, unspecified: Secondary | ICD-10-CM | POA: Diagnosis not present

## 2019-11-27 DIAGNOSIS — M791 Myalgia, unspecified site: Secondary | ICD-10-CM | POA: Diagnosis not present

## 2019-11-29 DIAGNOSIS — J3081 Allergic rhinitis due to animal (cat) (dog) hair and dander: Secondary | ICD-10-CM | POA: Diagnosis not present

## 2019-11-29 DIAGNOSIS — J301 Allergic rhinitis due to pollen: Secondary | ICD-10-CM | POA: Diagnosis not present

## 2019-11-29 DIAGNOSIS — F419 Anxiety disorder, unspecified: Secondary | ICD-10-CM | POA: Diagnosis not present

## 2019-11-29 DIAGNOSIS — J3089 Other allergic rhinitis: Secondary | ICD-10-CM | POA: Diagnosis not present

## 2019-11-30 DIAGNOSIS — Q068 Other specified congenital malformations of spinal cord: Secondary | ICD-10-CM | POA: Diagnosis not present

## 2019-12-03 DIAGNOSIS — J301 Allergic rhinitis due to pollen: Secondary | ICD-10-CM | POA: Diagnosis not present

## 2019-12-03 DIAGNOSIS — J3089 Other allergic rhinitis: Secondary | ICD-10-CM | POA: Diagnosis not present

## 2019-12-03 DIAGNOSIS — J3081 Allergic rhinitis due to animal (cat) (dog) hair and dander: Secondary | ICD-10-CM | POA: Diagnosis not present

## 2019-12-11 DIAGNOSIS — M542 Cervicalgia: Secondary | ICD-10-CM | POA: Diagnosis not present

## 2019-12-11 DIAGNOSIS — R51 Headache with orthostatic component, not elsewhere classified: Secondary | ICD-10-CM | POA: Diagnosis not present

## 2019-12-11 DIAGNOSIS — M791 Myalgia, unspecified site: Secondary | ICD-10-CM | POA: Diagnosis not present

## 2019-12-11 DIAGNOSIS — M25512 Pain in left shoulder: Secondary | ICD-10-CM | POA: Diagnosis not present

## 2019-12-12 DIAGNOSIS — J3081 Allergic rhinitis due to animal (cat) (dog) hair and dander: Secondary | ICD-10-CM | POA: Diagnosis not present

## 2019-12-12 DIAGNOSIS — J301 Allergic rhinitis due to pollen: Secondary | ICD-10-CM | POA: Diagnosis not present

## 2019-12-12 DIAGNOSIS — J3089 Other allergic rhinitis: Secondary | ICD-10-CM | POA: Diagnosis not present

## 2019-12-18 DIAGNOSIS — J3089 Other allergic rhinitis: Secondary | ICD-10-CM | POA: Diagnosis not present

## 2019-12-18 DIAGNOSIS — M546 Pain in thoracic spine: Secondary | ICD-10-CM | POA: Diagnosis not present

## 2019-12-18 DIAGNOSIS — M542 Cervicalgia: Secondary | ICD-10-CM | POA: Diagnosis not present

## 2019-12-18 DIAGNOSIS — J301 Allergic rhinitis due to pollen: Secondary | ICD-10-CM | POA: Diagnosis not present

## 2019-12-20 DIAGNOSIS — F419 Anxiety disorder, unspecified: Secondary | ICD-10-CM | POA: Diagnosis not present

## 2019-12-24 DIAGNOSIS — J301 Allergic rhinitis due to pollen: Secondary | ICD-10-CM | POA: Diagnosis not present

## 2019-12-24 DIAGNOSIS — J3081 Allergic rhinitis due to animal (cat) (dog) hair and dander: Secondary | ICD-10-CM | POA: Diagnosis not present

## 2019-12-24 DIAGNOSIS — J3089 Other allergic rhinitis: Secondary | ICD-10-CM | POA: Diagnosis not present

## 2019-12-27 DIAGNOSIS — M546 Pain in thoracic spine: Secondary | ICD-10-CM | POA: Diagnosis not present

## 2019-12-27 DIAGNOSIS — M542 Cervicalgia: Secondary | ICD-10-CM | POA: Diagnosis not present

## 2019-12-27 DIAGNOSIS — F419 Anxiety disorder, unspecified: Secondary | ICD-10-CM | POA: Diagnosis not present

## 2020-01-01 DIAGNOSIS — M542 Cervicalgia: Secondary | ICD-10-CM | POA: Diagnosis not present

## 2020-01-01 DIAGNOSIS — J3089 Other allergic rhinitis: Secondary | ICD-10-CM | POA: Diagnosis not present

## 2020-01-01 DIAGNOSIS — M546 Pain in thoracic spine: Secondary | ICD-10-CM | POA: Diagnosis not present

## 2020-01-01 DIAGNOSIS — J301 Allergic rhinitis due to pollen: Secondary | ICD-10-CM | POA: Diagnosis not present

## 2020-01-01 DIAGNOSIS — J3081 Allergic rhinitis due to animal (cat) (dog) hair and dander: Secondary | ICD-10-CM | POA: Diagnosis not present

## 2020-01-03 ENCOUNTER — Other Ambulatory Visit: Payer: Self-pay

## 2020-01-03 ENCOUNTER — Encounter: Payer: Self-pay | Admitting: Orthopedic Surgery

## 2020-01-03 ENCOUNTER — Ambulatory Visit: Payer: BC Managed Care – PPO | Admitting: Orthopedic Surgery

## 2020-01-03 ENCOUNTER — Ambulatory Visit: Payer: Self-pay

## 2020-01-03 DIAGNOSIS — F419 Anxiety disorder, unspecified: Secondary | ICD-10-CM | POA: Diagnosis not present

## 2020-01-03 DIAGNOSIS — M542 Cervicalgia: Secondary | ICD-10-CM

## 2020-01-04 ENCOUNTER — Encounter: Payer: Self-pay | Admitting: Orthopedic Surgery

## 2020-01-04 DIAGNOSIS — F902 Attention-deficit hyperactivity disorder, combined type: Secondary | ICD-10-CM | POA: Diagnosis not present

## 2020-01-04 DIAGNOSIS — F419 Anxiety disorder, unspecified: Secondary | ICD-10-CM | POA: Diagnosis not present

## 2020-01-04 DIAGNOSIS — Z79899 Other long term (current) drug therapy: Secondary | ICD-10-CM | POA: Diagnosis not present

## 2020-01-04 NOTE — Progress Notes (Signed)
Office Visit Note   Patient: Bernard Dalton           Date of Birth: 05/27/1981           MRN: [id number] Visit Date: 01/03/2020 Requested by: Bernard Sou, MD [id number] [place],  [place] [address] PCP: Bernard Sou, MD  Subjective: Chief Complaint  Patient presents with  . Neck - Pain    HPI: [name] is a patient with bilateral shoulder pain.  Describes a long history involving the shoulder and back.  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  He did have bilateral shoulder injections 2 years ago which gave him temporary relief.  Describes worse range of motion in the left shoulder.  He also describes headaches and facial numbness with radiating pain into the trapezial region.  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  To help treat this headache syndrome and facial numbness as well as muscle spasm and trigger points in the neck and trapezial region he has had about 100 physical therapy visits.  Figure-of-eight brace did help.  He has a history of trigger point injections.  MRI of the cervical spine showed a C5 small disc which was not felt to be symptomatic.  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             ROS: All systems reviewed are negative as they relate to the chief complaint within the history of present illness.  Patient denies  fevers or chills.   Assessment & Plan: Visit Diagnoses:  1. Neck pain     Plan: Impression is bilateral shoulder pain with some objective infraspinatus weakness on the left with no real definite explanation.  I do not think that what ever is causing this isolated left shoulder finding can really be connected to his migraine headaches and facial numbness.  Did offer him work-up of the shoulder which would include MRI  arthrogram to evaluate for rotator cuff pathology versus spinoglenoid notch cyst.  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  I will see him back as needed.  Follow-Up Instructions: Return if symptoms worsen or fail to improve.   Orders:  Orders Placed This Encounter  Procedures  . XR Cervical Spine 2 or 3 views   No orders of the defined types were placed in this encounter.     Procedures: No procedures performed   Clinical Data: No additional findings.  Objective: Vital Signs: There were no vitals taken for this visit.  Physical Exam:   Constitutional: Patient appears well-developed HEENT:  Head: Normocephalic Eyes:EOM are normal Neck: Normal range of motion Cardiovascular: Normal rate Pulmonary/chest: Effort normal Neurologic: Patient is alert Skin: Skin is warm Psychiatric: Patient has normal mood and affect    Ortho Exam: Ortho exam demonstrates pretty good cervical spine range of motion.  He has well-healed surgical incision from the upper thoracic to the upper lumbar spine.  Scheuerman's kyphosis has been corrected nicely.  He has 5 out of 5 grip EPL FPL interosseous wrist flexion extension bicep triceps and deltoid strength with no real  asymmetry to scapular motion.  He has a little weakness infraspinatus testing on the left compared to the right.  Negative O'Brien's and speeds testing bilaterally.  Radial pulse intact bilaterally.  Impingement signs equivocal bilaterally.  No scapulothoracic crepitus on either side.  Specialty Comments:  No specialty comments available.  Imaging: No results found.   PMFS History: Patient Active Problem List   Diagnosis Date Noted  . Muscle spasm 05/25/2019  . Paresthesia 05/25/2019  . 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 Neck pain 05/25/2018  . Occipital neuralgia  05/25/2018  . New persistent daily headache 04/13/2018  . Vision disturbance 04/13/2018  . Papilledema 04/13/2018  . Kyphosis deformity of spine 01/03/2018  . Thoracic back pain 01/03/2018  . Adult ADHD 02/20/2016  . Anxiety and depression 02/20/2016  . Interstitial cystitis 08/27/2014   Past Medical History:  Diagnosis Date  . ADD (attention deficit disorder)    Started seeing Dr. Johnnye Dalton at [facility] 07/2019->adderall changed to vyvanse.  . Allergic rhinitis    + allerg conjunctivitis.  Immunotherapy started 2019  . Anxiety and depression   . Blood transfusion without reported diagnosis 07/16/2019  . Chronic prostatitis    chronic irritative voiding symptoms: pelvic PT via [facility] helping as of 07/2016  . Concussion   . DDD (degenerative disc disease), cervical 08/2018   (Dr. Maia Dalton):  C5-C6 --ESI 25% improvement in fall 2019.  Pt desiring C spine surgery to see if this alleviates his symptom complex..  . Diverticulosis    with 'itis per pt report; however, review of old record CT reports shows mild SB enteritis with mesenteric adenopathy 06/2015; then 11/2014 CT abd/pelv done for urinary frequency and hematuria showed NORMAL abd/pelv  . Elevation of optic disc, bilateral 2019   Normal variant per ophtho--Dr. Larose Dalton.  Marland Kitchen Epigastric burning sensation 2020   EGD 08/13/19->gastritis. Continue daily PPI + H2 blocker  . 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 papilledema on optho exam.  Dr. Felecia Dalton with neuro saw him 04/2018, no papilledema noted and opening CSF pressure was nl.  CSF labs nl.  ESR and ANA normal.  No clear reason for HA's found.  Imipr trial no help.  Trig pt inj's in occipi mm's 05/2018 by Dr. Felecia Dalton. no help.Topamax trial 02/2019.  Marland Kitchen Headache syndrome 02/2018   As of 07/2018, pt has plan to see Dr. Patrice Dalton, NS/spine specialist as next step (his HAs are likely  cervicogenic).  Dr. Maia Dalton did C7-T1 ESI on 09/11/18--25% improvement.   . IBS (irritable bowel syndrome)    diarr predom as per old PCP records  . Nephrolithiasis    Nonobstructive, noted on CT (1-2 mm)-has never passed a stone that he knows of.  . Panic   . 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 Seasonal allergies   . Spondylosis without myelopathy or radiculopathy, thoracic region 2019  . Vision abnormalities   . Vitamin B12 deficiency 05/2019   intrinsic factor ab NEG. B12 normalized with oral B12    Family History  Problem Relation Age of Onset  . Alcohol abuse Mother   . Alcohol abuse Father   . Kidney disease Father   . Alcohol abuse Maternal Grandmother   . Alcohol abuse Maternal Grandfather   . Stroke Paternal Grandmother   . Congestive Heart Failure Paternal Grandmother   . Alcohol abuse Paternal Grandmother   . Alcohol abuse Paternal Grandfather   .  ADD / ADHD Brother   . ADD / ADHD Brother   . Stomach cancer Neg Hx   . Colon cancer Neg Hx   . Esophageal cancer Neg Hx   . Rectal cancer Neg Hx     Past Surgical History:  Procedure Laterality Date  . APPLICATION OF ROBOTIC ASSISTANCE FOR SPINAL PROCEDURE N/A 07/16/2019   Procedure: APPLICATION OF ROBOTIC ASSISTANCE FOR SPINAL PROCEDURE;  Surgeon: Kristeen Miss, MD;  Location: [facility];  Service: Neurosurgery;  Laterality: N/A;  . ESOPHAGOGASTRODUODENOSCOPY  08/13/2019   Antral gastritis; H pylori NEG  . LASIK Bilateral 2011  . POSTERIOR LUMBAR FUSION 4 LEVEL N/A 07/16/2019   Procedure: Thoracic Four to Lumbar Two posterior fusion, Kyphectomy with osteotomies, segmental pedicle screw fixation;  Surgeon: Kristeen Miss, MD;  Location: [facility];  Service: Neurosurgery;  Laterality: N/A;  Thoracic Four to Lumbar Two posterior fusion, Kyphectomy with osteotomies, segmental pedicle screw fixation  . REFRACTIVE SURGERY Bilateral    Social History   Occupational History  . Occupation: computer work  Tobacco  Use  . Smoking status: Never Smoker  . Smokeless tobacco: Never Used  Substance and Sexual Activity  . Alcohol use: No  . Drug use: No  . Sexual activity: Not on file

## 2020-01-05 DIAGNOSIS — R519 Headache, unspecified: Secondary | ICD-10-CM | POA: Diagnosis not present

## 2020-01-08 DIAGNOSIS — M542 Cervicalgia: Secondary | ICD-10-CM | POA: Diagnosis not present

## 2020-01-08 DIAGNOSIS — M546 Pain in thoracic spine: Secondary | ICD-10-CM | POA: Diagnosis not present

## 2020-01-09 ENCOUNTER — Encounter: Payer: Self-pay | Admitting: Family Medicine

## 2020-01-10 DIAGNOSIS — J3081 Allergic rhinitis due to animal (cat) (dog) hair and dander: Secondary | ICD-10-CM | POA: Diagnosis not present

## 2020-01-10 DIAGNOSIS — R519 Headache, unspecified: Secondary | ICD-10-CM | POA: Diagnosis not present

## 2020-01-10 DIAGNOSIS — J3089 Other allergic rhinitis: Secondary | ICD-10-CM | POA: Diagnosis not present

## 2020-01-10 DIAGNOSIS — J301 Allergic rhinitis due to pollen: Secondary | ICD-10-CM | POA: Diagnosis not present

## 2020-01-11 DIAGNOSIS — J301 Allergic rhinitis due to pollen: Secondary | ICD-10-CM | POA: Diagnosis not present

## 2020-01-11 DIAGNOSIS — J3081 Allergic rhinitis due to animal (cat) (dog) hair and dander: Secondary | ICD-10-CM | POA: Diagnosis not present

## 2020-01-14 DIAGNOSIS — J3089 Other allergic rhinitis: Secondary | ICD-10-CM | POA: Diagnosis not present

## 2020-01-16 DIAGNOSIS — M40294 Other kyphosis, thoracic region: Secondary | ICD-10-CM | POA: Diagnosis not present

## 2020-01-16 DIAGNOSIS — J3089 Other allergic rhinitis: Secondary | ICD-10-CM | POA: Diagnosis not present

## 2020-01-16 DIAGNOSIS — J3081 Allergic rhinitis due to animal (cat) (dog) hair and dander: Secondary | ICD-10-CM | POA: Diagnosis not present

## 2020-01-16 DIAGNOSIS — J301 Allergic rhinitis due to pollen: Secondary | ICD-10-CM | POA: Diagnosis not present

## 2020-01-17 DIAGNOSIS — F419 Anxiety disorder, unspecified: Secondary | ICD-10-CM | POA: Diagnosis not present

## 2020-01-18 DIAGNOSIS — M40294 Other kyphosis, thoracic region: Secondary | ICD-10-CM | POA: Diagnosis not present

## 2020-01-22 DIAGNOSIS — F411 Generalized anxiety disorder: Secondary | ICD-10-CM | POA: Diagnosis not present

## 2020-01-22 DIAGNOSIS — F902 Attention-deficit hyperactivity disorder, combined type: Secondary | ICD-10-CM | POA: Diagnosis not present

## 2020-01-22 DIAGNOSIS — R519 Headache, unspecified: Secondary | ICD-10-CM | POA: Diagnosis not present

## 2020-01-22 DIAGNOSIS — F329 Major depressive disorder, single episode, unspecified: Secondary | ICD-10-CM | POA: Diagnosis not present

## 2020-01-24 DIAGNOSIS — M546 Pain in thoracic spine: Secondary | ICD-10-CM | POA: Diagnosis not present

## 2020-01-24 DIAGNOSIS — J301 Allergic rhinitis due to pollen: Secondary | ICD-10-CM | POA: Diagnosis not present

## 2020-01-24 DIAGNOSIS — J3081 Allergic rhinitis due to animal (cat) (dog) hair and dander: Secondary | ICD-10-CM | POA: Diagnosis not present

## 2020-01-24 DIAGNOSIS — M542 Cervicalgia: Secondary | ICD-10-CM | POA: Diagnosis not present

## 2020-01-24 DIAGNOSIS — J3089 Other allergic rhinitis: Secondary | ICD-10-CM | POA: Diagnosis not present

## 2020-01-29 DIAGNOSIS — J3089 Other allergic rhinitis: Secondary | ICD-10-CM | POA: Diagnosis not present

## 2020-01-29 DIAGNOSIS — J301 Allergic rhinitis due to pollen: Secondary | ICD-10-CM | POA: Diagnosis not present

## 2020-01-29 DIAGNOSIS — J3081 Allergic rhinitis due to animal (cat) (dog) hair and dander: Secondary | ICD-10-CM | POA: Diagnosis not present

## 2020-01-29 DIAGNOSIS — R519 Headache, unspecified: Secondary | ICD-10-CM | POA: Diagnosis not present

## 2020-01-31 DIAGNOSIS — F419 Anxiety disorder, unspecified: Secondary | ICD-10-CM | POA: Diagnosis not present

## 2020-02-04 DIAGNOSIS — J301 Allergic rhinitis due to pollen: Secondary | ICD-10-CM | POA: Diagnosis not present

## 2020-02-04 DIAGNOSIS — J3081 Allergic rhinitis due to animal (cat) (dog) hair and dander: Secondary | ICD-10-CM | POA: Diagnosis not present

## 2020-02-04 DIAGNOSIS — J3089 Other allergic rhinitis: Secondary | ICD-10-CM | POA: Diagnosis not present

## 2020-02-05 DIAGNOSIS — F902 Attention-deficit hyperactivity disorder, combined type: Secondary | ICD-10-CM | POA: Diagnosis not present

## 2020-02-05 DIAGNOSIS — Z79899 Other long term (current) drug therapy: Secondary | ICD-10-CM | POA: Diagnosis not present

## 2020-02-05 DIAGNOSIS — F419 Anxiety disorder, unspecified: Secondary | ICD-10-CM | POA: Diagnosis not present

## 2020-02-05 DIAGNOSIS — G43709 Chronic migraine without aura, not intractable, without status migrainosus: Secondary | ICD-10-CM | POA: Diagnosis not present

## 2020-02-07 DIAGNOSIS — J3081 Allergic rhinitis due to animal (cat) (dog) hair and dander: Secondary | ICD-10-CM | POA: Diagnosis not present

## 2020-02-07 DIAGNOSIS — J301 Allergic rhinitis due to pollen: Secondary | ICD-10-CM | POA: Diagnosis not present

## 2020-02-07 DIAGNOSIS — J3089 Other allergic rhinitis: Secondary | ICD-10-CM | POA: Diagnosis not present

## 2020-02-07 DIAGNOSIS — G43709 Chronic migraine without aura, not intractable, without status migrainosus: Secondary | ICD-10-CM | POA: Diagnosis not present

## 2020-02-12 DIAGNOSIS — J3081 Allergic rhinitis due to animal (cat) (dog) hair and dander: Secondary | ICD-10-CM | POA: Diagnosis not present

## 2020-02-12 DIAGNOSIS — J3089 Other allergic rhinitis: Secondary | ICD-10-CM | POA: Diagnosis not present

## 2020-02-12 DIAGNOSIS — J301 Allergic rhinitis due to pollen: Secondary | ICD-10-CM | POA: Diagnosis not present

## 2020-02-12 DIAGNOSIS — R519 Headache, unspecified: Secondary | ICD-10-CM | POA: Diagnosis not present

## 2020-02-14 DIAGNOSIS — F419 Anxiety disorder, unspecified: Secondary | ICD-10-CM | POA: Diagnosis not present

## 2020-02-15 DIAGNOSIS — J3081 Allergic rhinitis due to animal (cat) (dog) hair and dander: Secondary | ICD-10-CM | POA: Diagnosis not present

## 2020-02-15 DIAGNOSIS — J301 Allergic rhinitis due to pollen: Secondary | ICD-10-CM | POA: Diagnosis not present

## 2020-02-15 DIAGNOSIS — J3089 Other allergic rhinitis: Secondary | ICD-10-CM | POA: Diagnosis not present

## 2020-02-18 DIAGNOSIS — J3089 Other allergic rhinitis: Secondary | ICD-10-CM | POA: Diagnosis not present

## 2020-02-18 DIAGNOSIS — J3081 Allergic rhinitis due to animal (cat) (dog) hair and dander: Secondary | ICD-10-CM | POA: Diagnosis not present

## 2020-02-18 DIAGNOSIS — J301 Allergic rhinitis due to pollen: Secondary | ICD-10-CM | POA: Diagnosis not present

## 2020-02-20 DIAGNOSIS — F411 Generalized anxiety disorder: Secondary | ICD-10-CM | POA: Diagnosis not present

## 2020-02-20 DIAGNOSIS — F329 Major depressive disorder, single episode, unspecified: Secondary | ICD-10-CM | POA: Diagnosis not present

## 2020-02-20 DIAGNOSIS — F902 Attention-deficit hyperactivity disorder, combined type: Secondary | ICD-10-CM | POA: Diagnosis not present

## 2020-02-26 DIAGNOSIS — M40294 Other kyphosis, thoracic region: Secondary | ICD-10-CM | POA: Diagnosis not present

## 2020-02-28 DIAGNOSIS — F419 Anxiety disorder, unspecified: Secondary | ICD-10-CM | POA: Diagnosis not present

## 2020-02-29 DIAGNOSIS — J3081 Allergic rhinitis due to animal (cat) (dog) hair and dander: Secondary | ICD-10-CM | POA: Diagnosis not present

## 2020-02-29 DIAGNOSIS — J301 Allergic rhinitis due to pollen: Secondary | ICD-10-CM | POA: Diagnosis not present

## 2020-02-29 DIAGNOSIS — J3089 Other allergic rhinitis: Secondary | ICD-10-CM | POA: Diagnosis not present

## 2020-03-04 DIAGNOSIS — J301 Allergic rhinitis due to pollen: Secondary | ICD-10-CM | POA: Diagnosis not present

## 2020-03-04 DIAGNOSIS — J3081 Allergic rhinitis due to animal (cat) (dog) hair and dander: Secondary | ICD-10-CM | POA: Diagnosis not present

## 2020-03-04 DIAGNOSIS — J3089 Other allergic rhinitis: Secondary | ICD-10-CM | POA: Diagnosis not present

## 2020-03-11 ENCOUNTER — Telehealth: Payer: Self-pay | Admitting: Neurology

## 2020-03-11 DIAGNOSIS — J3089 Other allergic rhinitis: Secondary | ICD-10-CM | POA: Diagnosis not present

## 2020-03-11 DIAGNOSIS — J301 Allergic rhinitis due to pollen: Secondary | ICD-10-CM | POA: Diagnosis not present

## 2020-03-11 NOTE — Telephone Encounter (Signed)
I'm ok with a switch

## 2020-03-11 NOTE — Telephone Encounter (Signed)
Patient is a returning patient of Dr. Rexene Alberts. He is being referred back to the office for neck pain, Headaches and shoulder pain. His referring Dr. Ellene Route advised that he sees Dr. Krista Blue. Please advise if the switch is ok.  Thank you

## 2020-03-12 NOTE — Telephone Encounter (Signed)
I have reviewed previous evaluation by Dr. [name], MRIs in 2019, normal MRI of brain, mild degenerative changes at MRI cervical spine, exaggerated thoracic kyphosis,  I do not think I have anything better to offer for his headache, neck pain and shoulder pain, please suggest patient to see pain management,

## 2020-03-13 DIAGNOSIS — F419 Anxiety disorder, unspecified: Secondary | ICD-10-CM | POA: Diagnosis not present

## 2020-03-21 DIAGNOSIS — J3089 Other allergic rhinitis: Secondary | ICD-10-CM | POA: Diagnosis not present

## 2020-03-21 DIAGNOSIS — J301 Allergic rhinitis due to pollen: Secondary | ICD-10-CM | POA: Diagnosis not present

## 2020-03-21 DIAGNOSIS — J3081 Allergic rhinitis due to animal (cat) (dog) hair and dander: Secondary | ICD-10-CM | POA: Diagnosis not present

## 2020-03-25 ENCOUNTER — Other Ambulatory Visit: Payer: Self-pay

## 2020-03-26 ENCOUNTER — Encounter: Payer: BC Managed Care – PPO | Admitting: Family Medicine

## 2020-03-26 DIAGNOSIS — J301 Allergic rhinitis due to pollen: Secondary | ICD-10-CM | POA: Diagnosis not present

## 2020-03-26 DIAGNOSIS — J3089 Other allergic rhinitis: Secondary | ICD-10-CM | POA: Diagnosis not present

## 2020-03-26 DIAGNOSIS — J3081 Allergic rhinitis due to animal (cat) (dog) hair and dander: Secondary | ICD-10-CM | POA: Diagnosis not present

## 2020-03-27 DIAGNOSIS — F419 Anxiety disorder, unspecified: Secondary | ICD-10-CM | POA: Diagnosis not present

## 2020-03-31 DIAGNOSIS — F902 Attention-deficit hyperactivity disorder, combined type: Secondary | ICD-10-CM | POA: Diagnosis not present

## 2020-03-31 DIAGNOSIS — F419 Anxiety disorder, unspecified: Secondary | ICD-10-CM | POA: Diagnosis not present

## 2020-03-31 DIAGNOSIS — Z79899 Other long term (current) drug therapy: Secondary | ICD-10-CM | POA: Diagnosis not present

## 2020-04-01 DIAGNOSIS — J301 Allergic rhinitis due to pollen: Secondary | ICD-10-CM | POA: Diagnosis not present

## 2020-04-01 DIAGNOSIS — J3081 Allergic rhinitis due to animal (cat) (dog) hair and dander: Secondary | ICD-10-CM | POA: Diagnosis not present

## 2020-04-01 DIAGNOSIS — J3089 Other allergic rhinitis: Secondary | ICD-10-CM | POA: Diagnosis not present

## 2020-04-07 ENCOUNTER — Encounter: Payer: Self-pay | Admitting: Family Medicine

## 2020-04-08 ENCOUNTER — Institutional Professional Consult (permissible substitution): Payer: BC Managed Care – PPO | Admitting: Neurology

## 2020-04-08 DIAGNOSIS — J3089 Other allergic rhinitis: Secondary | ICD-10-CM | POA: Diagnosis not present

## 2020-04-08 DIAGNOSIS — G43709 Chronic migraine without aura, not intractable, without status migrainosus: Secondary | ICD-10-CM | POA: Diagnosis not present

## 2020-04-08 DIAGNOSIS — J301 Allergic rhinitis due to pollen: Secondary | ICD-10-CM | POA: Diagnosis not present

## 2020-04-08 DIAGNOSIS — J3081 Allergic rhinitis due to animal (cat) (dog) hair and dander: Secondary | ICD-10-CM | POA: Diagnosis not present

## 2020-04-10 DIAGNOSIS — F419 Anxiety disorder, unspecified: Secondary | ICD-10-CM | POA: Diagnosis not present

## 2020-04-11 ENCOUNTER — Other Ambulatory Visit: Payer: Self-pay

## 2020-04-11 ENCOUNTER — Encounter: Payer: Self-pay | Admitting: Family Medicine

## 2020-04-11 ENCOUNTER — Ambulatory Visit (INDEPENDENT_AMBULATORY_CARE_PROVIDER_SITE_OTHER): Payer: BC Managed Care – PPO | Admitting: Family Medicine

## 2020-04-11 VITALS — BP 117/80 | HR 72 | Temp 98.2°F | Resp 16 | Ht 72.0 in | Wt 166.6 lb

## 2020-04-11 DIAGNOSIS — Z23 Encounter for immunization: Secondary | ICD-10-CM

## 2020-04-11 DIAGNOSIS — Z0000 Encounter for general adult medical examination without abnormal findings: Secondary | ICD-10-CM | POA: Diagnosis not present

## 2020-04-11 LAB — CBC WITH DIFFERENTIAL/PLATELET
Basophils Absolute: 0 10*3/uL (ref 0.0–0.1)
Basophils Relative: 0.5 % (ref 0.0–3.0)
Eosinophils Absolute: 0.1 10*3/uL (ref 0.0–0.7)
Eosinophils Relative: 1.5 % (ref 0.0–5.0)
HCT: 43.7 % (ref 39.0–52.0)
Hemoglobin: 15.1 g/dL (ref 13.0–17.0)
Lymphocytes Relative: 24.9 % (ref 12.0–46.0)
Lymphs Abs: 1.2 10*3/uL (ref 0.7–4.0)
MCHC: 34.5 g/dL (ref 30.0–36.0)
MCV: 89 fl (ref 78.0–100.0)
Monocytes Absolute: 0.4 10*3/uL (ref 0.1–1.0)
Monocytes Relative: 8.1 % (ref 3.0–12.0)
Neutro Abs: 3.1 10*3/uL (ref 1.4–7.7)
Neutrophils Relative %: 65 % (ref 43.0–77.0)
Platelets: 177 10*3/uL (ref 150.0–400.0)
RBC: 4.91 Mil/uL (ref 4.22–5.81)
RDW: 14.4 % (ref 11.5–15.5)
WBC: 4.8 10*3/uL (ref 4.0–10.5)

## 2020-04-11 LAB — COMPREHENSIVE METABOLIC PANEL
ALT: 12 U/L (ref 0–53)
AST: 11 U/L (ref 0–37)
Albumin: 4.4 g/dL (ref 3.5–5.2)
Alkaline Phosphatase: 64 U/L (ref 39–117)
BUN: 10 mg/dL (ref 6–23)
CO2: 33 mEq/L — ABNORMAL HIGH (ref 19–32)
Calcium: 9.4 mg/dL (ref 8.4–10.5)
Chloride: 103 mEq/L (ref 96–112)
Creatinine, Ser: 0.91 mg/dL (ref 0.40–1.50)
GFR: 92.78 mL/min (ref >60.00)
Glucose, Bld: 99 mg/dL (ref 70–99)
Potassium: 4.8 mEq/L (ref 3.5–5.1)
Sodium: 141 mEq/L (ref 135–145)
Total Bilirubin: 1 mg/dL (ref 0.2–1.2)
Total Protein: 6.3 g/dL (ref 6.0–8.3)

## 2020-04-11 LAB — LIPID PANEL
Cholesterol: 177 mg/dL (ref 0–200)
HDL: 61.8 mg/dL (ref >39.00)
LDL Cholesterol: 107 mg/dL — ABNORMAL HIGH (ref 0–99)
NonHDL: 115.1
Total CHOL/HDL Ratio: 3
Triglycerides: 39 mg/dL (ref 0.0–149.0)
VLDL: 7.8 mg/dL (ref 0.0–40.0)

## 2020-04-11 LAB — TSH: TSH: 1.32 u[IU]/mL (ref 0.35–4.50)

## 2020-04-11 NOTE — Progress Notes (Signed)
Office Note 04/11/2020  CC:  Chief Complaint  Patient presents with  . Annual Exam    pt is fasting   HPI:  Bernard Dalton is a 39 y.o. White male who is here for annual health maintenance exam.  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 Daughters 31 y/o and 25 y/o. Diet: health, counts calories.  Chronic thoracic back pain and HAs all stable.  Past Medical History:  Diagnosis Date  . ADD (attention deficit disorder)    Started seeing Dr. Johnnye Sima at [facility] 07/2019->adderall changed to vyvanse, stable on 69m qd as of 12/2019  . Allergic rhinitis    + allerg conjunctivitis.  Immunotherapy started 2019  . Anxiety and depression   . Blood transfusion without reported diagnosis 07/16/2019  . Chronic prostatitis    chronic irritative voiding symptoms: pelvic PT via [facility] helping as of 07/2016  . Concussion   . DDD (degenerative disc disease), cervical 08/2018   (Dr. SMaia Petties:  C5-C6 --ESI 25% improvement in fall 2019.  Pt desiring C spine surgery to see if this alleviates his symptom complex..  . Diverticulosis    with 'itis per pt report; however, review of old record CT reports shows mild SB enteritis with mesenteric adenopathy 06/2015; then 11/2014 CT abd/pelv done for urinary frequency and hematuria showed NORMAL abd/pelv  . Elevation of optic disc, bilateral 2019   Normal variant per ophtho--Dr. HLarose Kells  .Marland KitchenEpigastric burning sensation 2020   EGD 08/13/19->gastritis. Continue daily PPI + H2 blocker  . 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 papilledema on optho exam.  Dr. SFelecia Shellingwith neuro saw him 04/2018, no papilledema noted and opening CSF pressure was nl.  CSF labs nl.  ESR and ANA normal.  No clear reason for HA's found.  Imipr trial no help.  Trig pt inj's in occipi mm's 05/2018 by Dr. SFelecia Shelling no help.Topamax trial 02/2019.  .Marland KitchenHeadache syndrome 02/2018   As  of 07/2018, pt has plan to see Dr. CPatrice Paradise NS/spine specialist as next step (his HAs are likely cervicogenic).  Dr. SMaia Pettiesdid C7-T1 ESI on 09/11/18--25% improvement.   . IBS (irritable bowel syndrome)    diarr predom as per old PCP records  . Nephrolithiasis    Nonobstructive, noted on CT (1-2 mm)-has never passed a stone that he knows of.  . Panic   . 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 Seasonal allergies   . Spondylosis without myelopathy or radiculopathy, thoracic region 2019  . Vision abnormalities   . Vitamin B12 deficiency 05/2019   intrinsic factor ab NEG. B12 normalized with oral B12    Past Surgical History:  Procedure Laterality Date  . APPLICATION OF ROBOTIC ASSISTANCE FOR SPINAL PROCEDURE N/A 07/16/2019   Procedure: APPLICATION OF ROBOTIC ASSISTANCE FOR SPINAL PROCEDURE;  Surgeon: EKristeen Miss MD;  [facility]  Service: Neurosurgery;  Laterality: N/A;  . ESOPHAGOGASTRODUODENOSCOPY  08/13/2019   Antral gastritis; H pylori NEG  . LASIK Bilateral 2011  . POSTERIOR LUMBAR FUSION 4 LEVEL N/A 07/16/2019   Procedure: Thoracic Four to Lumbar Two posterior fusion, Kyphectomy with osteotomies, segmental pedicle screw fixation;  Surgeon: EKristeen Miss MD;  [facility]  Service: Neurosurgery;  Laterality: N/A;  Thoracic Four to Lumbar Two posterior fusion, Kyphectomy with osteotomies, segmental pedicle screw fixation  . REFRACTIVE SURGERY Bilateral     Family History  Problem Relation Age of  Onset  . Alcohol abuse Mother   . Alcohol abuse Father   . Kidney disease Father   . Alcohol abuse Maternal Grandmother   . Alcohol abuse Maternal Grandfather   . Stroke Paternal Grandmother   . Congestive Heart Failure Paternal Grandmother   . Alcohol abuse Paternal Grandmother   . Alcohol abuse Paternal Grandfather   . ADD / ADHD Brother   . ADD / ADHD Brother   . Stomach cancer Neg Hx   . Colon cancer Neg Hx   . Esophageal cancer Neg Hx    . Rectal cancer Neg Hx     Social History   Socioeconomic History  . Marital status: Married    Spouse name: Not on file  . Number of children: 2  . Years of education: Not on file  . Highest education level: Not on file  Occupational History  . Occupation: computer work  Tobacco Use  . Smoking status: Never Smoker  . Smokeless tobacco: Never Used  Substance and Sexual Activity  . Alcohol use: No  . Drug use: No  . Sexual activity: Not on file  Other Topics Concern  . Not on file  Social History Narrative   Married, 1 child and 1 on the way.   Relocated from [facility] to [facility] 08/2015.   Educ: college at [employer].   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   No T/A/Ds.   Social Determinants of Health   Financial Resource Strain:   . Difficulty of Paying Living Expenses:   Food Insecurity:   . Worried About Charity fundraiser in the Last Year:   . Arboriculturist in the Last Year:   Transportation Needs:   . Film/video editor (Medical):   Marland Kitchen Lack of Transportation (Non-Medical):   Physical Activity:   . Days of Exercise per Week:   . Minutes of Exercise per Session:   Stress:   . Feeling of Stress :   Social Connections:   . Frequency of Communication with Friends and Family:   . Frequency of Social Gatherings with Friends and Family:   . Attends Religious Services:   . Active Member of Clubs or Organizations:   . Attends Archivist Meetings:   Marland Kitchen Marital Status:   Intimate Partner Violence:   . Fear of Current or Ex-Partner:   . Emotionally Abused:   Marland Kitchen Physically Abused:   . Sexually Abused:     Outpatient Medications Prior to Visit  Medication Sig Dispense Refill  . celecoxib (CELEBREX) 100 MG capsule Take 100 mg by mouth 2 (two) times daily.    . fexofenadine (ALLEGRA) 180 MG tablet Take 180 mg by mouth daily.    Marland Kitchen gabapentin (NEURONTIN) 300 MG capsule Take one po qAM, one po qPM and teo po qHS (Patient taking differently: Take 300  mg by mouth 4 (four) times daily. ) 120 capsule 11  . omeprazole (PRILOSEC) 40 MG capsule Take 1 capsule (40 mg total) by mouth daily. 30 capsule 11  . VYVANSE 20 MG capsule Take 1 capsule by mouth every morning.     No facility-administered medications prior to visit.    No Known Allergies  ROS Review of Systems  Constitutional: Negative for appetite change, chills, fatigue and fever.  HENT: Negative for congestion, dental problem, ear pain and sore throat.   Eyes: Negative for discharge, redness and visual disturbance.  Respiratory: Negative for cough, chest tightness, shortness of breath and wheezing.  Cardiovascular: Negative for chest pain, palpitations and leg swelling.  Gastrointestinal: Negative for abdominal pain, blood in stool, diarrhea, nausea and vomiting.  Genitourinary: Negative for difficulty urinating, dysuria, flank pain, frequency, hematuria and urgency.  Musculoskeletal: Negative for arthralgias, back pain, joint swelling, myalgias and neck stiffness.  Skin: Negative for pallor and rash.  Neurological: Negative for dizziness, speech difficulty, weakness and headaches.  Hematological: Negative for adenopathy. Does not bruise/bleed easily.  Psychiatric/Behavioral: Negative for confusion and sleep disturbance. The patient is not nervous/anxious.     PE; Vitals with BMI 04/11/2020 09/25/2019 09/20/2019  Height 6' 0"  6' 0"  6' 0"   Weight 166 lbs 10 oz 157 lbs 13 oz 156 lbs 10 oz  BMI 22.59 76.1 95.09  Systolic 326 712 458  Diastolic 80 78 66  Pulse 72 82 72    Gen: Alert, well appearing.  Patient is oriented to person, place, time, and situation. AFFECT: pleasant, lucid thought and speech. ENT: Ears: EACs clear, normal epithelium.  TMs with good light reflex and landmarks bilaterally.  Eyes: no injection, icteris, swelling, or exudate.  EOMI, PERRLA. Nose: no drainage or turbinate edema/swelling.  No injection or focal lesion.  Mouth: lips without lesion/swelling.   Oral mucosa pink and moist.  Dentition intact and without obvious caries or gingival swelling.  Oropharynx without erythema, exudate, or swelling.  Neck: supple/nontender.  No LAD, mass, or TM.  Carotid pulses 2+ bilaterally, without bruits. CV: RRR, no m/r/g.   LUNGS: CTA bilat, nonlabored resps, good aeration in all lung fields. ABD: soft, NT, ND, BS normal.  No hepatospenomegaly or mass.  No bruits. EXT: no clubbing, cyanosis, or edema.  Musculoskeletal: no joint swelling, erythema, warmth, or tenderness.  ROM of all joints intact. Skin - no sores or suspicious lesions or rashes or color changes   Pertinent labs:   Lab Results  Component Value Date   VITAMINB12 921 (H) 09/25/2019    Lab Results  Component Value Date   TSH 1.31 07/29/2016   Lab Results  Component Value Date   WBC 5.3 09/25/2019   HGB 14.4 09/25/2019   HCT 42.6 09/25/2019   MCV 87.0 09/25/2019   PLT 207.0 09/25/2019   Lab Results  Component Value Date   CREATININE 0.84 07/19/2019   BUN 7 07/19/2019   NA 135 07/19/2019   K 3.8 07/19/2019   CL 103 07/19/2019   CO2 27 07/19/2019   Lab Results  Component Value Date   ALT 10 07/29/2016   AST 10 07/29/2016   ALKPHOS 47 07/29/2016   BILITOT 1.1 07/29/2016   Lab Results  Component Value Date   HGBA1C 4.7 07/29/2016   ASSESSMENT AND PLAN:   Health maintenance exam: Reviewed age and gender appropriate health maintenance issues (prudent diet, regular exercise, health risks of tobacco and excessive alcohol, use of seatbelts, fire alarms in home, use of sunscreen).  Also reviewed age and gender appropriate health screening as well as vaccine recommendations. Vaccines: Tdap due-->given today. Labs: fasting HP ordered. Prostate ca screening: average risk patient= as per latest guidelines, start screening at 45-50 yrs of age. Colon ca screening: average risk patient= as per latest guidelines, start screening at 63 yrs of age.  An After Visit Summary was  printed and given to the patient.  FOLLOW UP:  Return in about 1 year (around 04/11/2021) for annual CPE (fasting).  Signed:  Crissie Sickles, MD           04/11/2020

## 2020-04-11 NOTE — Addendum Note (Signed)
Addended by: Deveron Furlong D on: 04/11/2020 08:37 AM   Modules accepted: Orders

## 2020-04-11 NOTE — Patient Instructions (Signed)

## 2020-04-17 DIAGNOSIS — J3089 Other allergic rhinitis: Secondary | ICD-10-CM | POA: Diagnosis not present

## 2020-04-17 DIAGNOSIS — J301 Allergic rhinitis due to pollen: Secondary | ICD-10-CM | POA: Diagnosis not present

## 2020-04-17 DIAGNOSIS — J3081 Allergic rhinitis due to animal (cat) (dog) hair and dander: Secondary | ICD-10-CM | POA: Diagnosis not present

## 2020-04-22 DIAGNOSIS — M5481 Occipital neuralgia: Secondary | ICD-10-CM | POA: Diagnosis not present

## 2020-04-22 DIAGNOSIS — M542 Cervicalgia: Secondary | ICD-10-CM | POA: Diagnosis not present

## 2020-04-22 DIAGNOSIS — J301 Allergic rhinitis due to pollen: Secondary | ICD-10-CM | POA: Diagnosis not present

## 2020-04-22 DIAGNOSIS — J3081 Allergic rhinitis due to animal (cat) (dog) hair and dander: Secondary | ICD-10-CM | POA: Diagnosis not present

## 2020-04-22 DIAGNOSIS — J3089 Other allergic rhinitis: Secondary | ICD-10-CM | POA: Diagnosis not present

## 2020-04-22 DIAGNOSIS — M791 Myalgia, unspecified site: Secondary | ICD-10-CM | POA: Diagnosis not present

## 2020-04-24 DIAGNOSIS — F419 Anxiety disorder, unspecified: Secondary | ICD-10-CM | POA: Diagnosis not present

## 2020-04-30 DIAGNOSIS — J301 Allergic rhinitis due to pollen: Secondary | ICD-10-CM | POA: Diagnosis not present

## 2020-04-30 DIAGNOSIS — J3089 Other allergic rhinitis: Secondary | ICD-10-CM | POA: Diagnosis not present

## 2020-04-30 DIAGNOSIS — J3081 Allergic rhinitis due to animal (cat) (dog) hair and dander: Secondary | ICD-10-CM | POA: Diagnosis not present

## 2020-05-05 DIAGNOSIS — F900 Attention-deficit hyperactivity disorder, predominantly inattentive type: Secondary | ICD-10-CM | POA: Diagnosis not present

## 2020-05-05 DIAGNOSIS — F411 Generalized anxiety disorder: Secondary | ICD-10-CM | POA: Diagnosis not present

## 2020-05-05 DIAGNOSIS — J301 Allergic rhinitis due to pollen: Secondary | ICD-10-CM | POA: Diagnosis not present

## 2020-05-05 DIAGNOSIS — F329 Major depressive disorder, single episode, unspecified: Secondary | ICD-10-CM | POA: Diagnosis not present

## 2020-05-05 DIAGNOSIS — J3089 Other allergic rhinitis: Secondary | ICD-10-CM | POA: Diagnosis not present

## 2020-05-05 DIAGNOSIS — J3081 Allergic rhinitis due to animal (cat) (dog) hair and dander: Secondary | ICD-10-CM | POA: Diagnosis not present

## 2020-05-06 DIAGNOSIS — F419 Anxiety disorder, unspecified: Secondary | ICD-10-CM | POA: Diagnosis not present

## 2020-05-08 DIAGNOSIS — G43709 Chronic migraine without aura, not intractable, without status migrainosus: Secondary | ICD-10-CM | POA: Diagnosis not present

## 2020-05-14 DIAGNOSIS — J3081 Allergic rhinitis due to animal (cat) (dog) hair and dander: Secondary | ICD-10-CM | POA: Diagnosis not present

## 2020-05-14 DIAGNOSIS — J3089 Other allergic rhinitis: Secondary | ICD-10-CM | POA: Diagnosis not present

## 2020-05-14 DIAGNOSIS — J301 Allergic rhinitis due to pollen: Secondary | ICD-10-CM | POA: Diagnosis not present

## 2020-05-20 DIAGNOSIS — J3089 Other allergic rhinitis: Secondary | ICD-10-CM | POA: Diagnosis not present

## 2020-05-20 DIAGNOSIS — J3081 Allergic rhinitis due to animal (cat) (dog) hair and dander: Secondary | ICD-10-CM | POA: Diagnosis not present

## 2020-05-20 DIAGNOSIS — J301 Allergic rhinitis due to pollen: Secondary | ICD-10-CM | POA: Diagnosis not present

## 2020-05-22 DIAGNOSIS — F419 Anxiety disorder, unspecified: Secondary | ICD-10-CM | POA: Diagnosis not present

## 2020-06-06 DIAGNOSIS — J3081 Allergic rhinitis due to animal (cat) (dog) hair and dander: Secondary | ICD-10-CM | POA: Diagnosis not present

## 2020-06-06 DIAGNOSIS — J3089 Other allergic rhinitis: Secondary | ICD-10-CM | POA: Diagnosis not present

## 2020-06-06 DIAGNOSIS — J301 Allergic rhinitis due to pollen: Secondary | ICD-10-CM | POA: Diagnosis not present

## 2020-06-09 DIAGNOSIS — J3081 Allergic rhinitis due to animal (cat) (dog) hair and dander: Secondary | ICD-10-CM | POA: Diagnosis not present

## 2020-06-09 DIAGNOSIS — J301 Allergic rhinitis due to pollen: Secondary | ICD-10-CM | POA: Diagnosis not present

## 2020-06-09 DIAGNOSIS — J3089 Other allergic rhinitis: Secondary | ICD-10-CM | POA: Diagnosis not present

## 2020-06-16 DIAGNOSIS — F419 Anxiety disorder, unspecified: Secondary | ICD-10-CM | POA: Diagnosis not present

## 2020-06-18 DIAGNOSIS — J301 Allergic rhinitis due to pollen: Secondary | ICD-10-CM | POA: Diagnosis not present

## 2020-06-18 DIAGNOSIS — J3081 Allergic rhinitis due to animal (cat) (dog) hair and dander: Secondary | ICD-10-CM | POA: Diagnosis not present

## 2020-06-18 DIAGNOSIS — M40294 Other kyphosis, thoracic region: Secondary | ICD-10-CM | POA: Diagnosis not present

## 2020-06-18 DIAGNOSIS — J3089 Other allergic rhinitis: Secondary | ICD-10-CM | POA: Diagnosis not present

## 2020-06-27 DIAGNOSIS — J3089 Other allergic rhinitis: Secondary | ICD-10-CM | POA: Diagnosis not present

## 2020-06-27 DIAGNOSIS — F902 Attention-deficit hyperactivity disorder, combined type: Secondary | ICD-10-CM | POA: Diagnosis not present

## 2020-06-27 DIAGNOSIS — J3081 Allergic rhinitis due to animal (cat) (dog) hair and dander: Secondary | ICD-10-CM | POA: Diagnosis not present

## 2020-06-27 DIAGNOSIS — Z79899 Other long term (current) drug therapy: Secondary | ICD-10-CM | POA: Diagnosis not present

## 2020-06-27 DIAGNOSIS — J301 Allergic rhinitis due to pollen: Secondary | ICD-10-CM | POA: Diagnosis not present

## 2020-07-02 DIAGNOSIS — J3081 Allergic rhinitis due to animal (cat) (dog) hair and dander: Secondary | ICD-10-CM | POA: Diagnosis not present

## 2020-07-02 DIAGNOSIS — J3089 Other allergic rhinitis: Secondary | ICD-10-CM | POA: Diagnosis not present

## 2020-07-02 DIAGNOSIS — J301 Allergic rhinitis due to pollen: Secondary | ICD-10-CM | POA: Diagnosis not present

## 2020-07-03 DIAGNOSIS — J301 Allergic rhinitis due to pollen: Secondary | ICD-10-CM | POA: Diagnosis not present

## 2020-07-03 DIAGNOSIS — F419 Anxiety disorder, unspecified: Secondary | ICD-10-CM | POA: Diagnosis not present

## 2020-07-03 DIAGNOSIS — J3089 Other allergic rhinitis: Secondary | ICD-10-CM | POA: Diagnosis not present

## 2020-07-03 DIAGNOSIS — J3081 Allergic rhinitis due to animal (cat) (dog) hair and dander: Secondary | ICD-10-CM | POA: Diagnosis not present

## 2020-07-11 DIAGNOSIS — J3089 Other allergic rhinitis: Secondary | ICD-10-CM | POA: Diagnosis not present

## 2020-07-11 DIAGNOSIS — J301 Allergic rhinitis due to pollen: Secondary | ICD-10-CM | POA: Diagnosis not present

## 2020-07-11 DIAGNOSIS — J3081 Allergic rhinitis due to animal (cat) (dog) hair and dander: Secondary | ICD-10-CM | POA: Diagnosis not present

## 2020-07-16 DIAGNOSIS — J301 Allergic rhinitis due to pollen: Secondary | ICD-10-CM | POA: Diagnosis not present

## 2020-07-16 DIAGNOSIS — J3081 Allergic rhinitis due to animal (cat) (dog) hair and dander: Secondary | ICD-10-CM | POA: Diagnosis not present

## 2020-07-16 DIAGNOSIS — J3089 Other allergic rhinitis: Secondary | ICD-10-CM | POA: Diagnosis not present

## 2020-07-17 DIAGNOSIS — F419 Anxiety disorder, unspecified: Secondary | ICD-10-CM | POA: Diagnosis not present

## 2020-07-18 DIAGNOSIS — J3081 Allergic rhinitis due to animal (cat) (dog) hair and dander: Secondary | ICD-10-CM | POA: Diagnosis not present

## 2020-07-18 DIAGNOSIS — J3089 Other allergic rhinitis: Secondary | ICD-10-CM | POA: Diagnosis not present

## 2020-07-18 DIAGNOSIS — J301 Allergic rhinitis due to pollen: Secondary | ICD-10-CM | POA: Diagnosis not present

## 2020-07-22 DIAGNOSIS — M542 Cervicalgia: Secondary | ICD-10-CM | POA: Diagnosis not present

## 2020-07-22 DIAGNOSIS — G8929 Other chronic pain: Secondary | ICD-10-CM | POA: Diagnosis not present

## 2020-07-22 DIAGNOSIS — M546 Pain in thoracic spine: Secondary | ICD-10-CM | POA: Diagnosis not present

## 2020-07-22 DIAGNOSIS — J301 Allergic rhinitis due to pollen: Secondary | ICD-10-CM | POA: Diagnosis not present

## 2020-07-22 DIAGNOSIS — J3081 Allergic rhinitis due to animal (cat) (dog) hair and dander: Secondary | ICD-10-CM | POA: Diagnosis not present

## 2020-07-22 DIAGNOSIS — J3089 Other allergic rhinitis: Secondary | ICD-10-CM | POA: Diagnosis not present

## 2020-07-22 DIAGNOSIS — G43709 Chronic migraine without aura, not intractable, without status migrainosus: Secondary | ICD-10-CM | POA: Diagnosis not present

## 2020-07-29 DIAGNOSIS — J301 Allergic rhinitis due to pollen: Secondary | ICD-10-CM | POA: Diagnosis not present

## 2020-07-29 DIAGNOSIS — J3081 Allergic rhinitis due to animal (cat) (dog) hair and dander: Secondary | ICD-10-CM | POA: Diagnosis not present

## 2020-07-29 DIAGNOSIS — J3089 Other allergic rhinitis: Secondary | ICD-10-CM | POA: Diagnosis not present

## 2020-08-06 DIAGNOSIS — J3081 Allergic rhinitis due to animal (cat) (dog) hair and dander: Secondary | ICD-10-CM | POA: Diagnosis not present

## 2020-08-06 DIAGNOSIS — J3089 Other allergic rhinitis: Secondary | ICD-10-CM | POA: Diagnosis not present

## 2020-08-06 DIAGNOSIS — J301 Allergic rhinitis due to pollen: Secondary | ICD-10-CM | POA: Diagnosis not present

## 2020-08-06 DIAGNOSIS — G43709 Chronic migraine without aura, not intractable, without status migrainosus: Secondary | ICD-10-CM | POA: Diagnosis not present

## 2020-08-11 DIAGNOSIS — F419 Anxiety disorder, unspecified: Secondary | ICD-10-CM | POA: Diagnosis not present

## 2020-08-12 DIAGNOSIS — J301 Allergic rhinitis due to pollen: Secondary | ICD-10-CM | POA: Diagnosis not present

## 2020-08-12 DIAGNOSIS — J3089 Other allergic rhinitis: Secondary | ICD-10-CM | POA: Diagnosis not present

## 2020-08-12 DIAGNOSIS — J3081 Allergic rhinitis due to animal (cat) (dog) hair and dander: Secondary | ICD-10-CM | POA: Diagnosis not present

## 2020-08-19 DIAGNOSIS — J301 Allergic rhinitis due to pollen: Secondary | ICD-10-CM | POA: Diagnosis not present

## 2020-08-19 DIAGNOSIS — J3089 Other allergic rhinitis: Secondary | ICD-10-CM | POA: Diagnosis not present

## 2020-08-19 DIAGNOSIS — J3081 Allergic rhinitis due to animal (cat) (dog) hair and dander: Secondary | ICD-10-CM | POA: Diagnosis not present

## 2020-08-20 DIAGNOSIS — G44209 Tension-type headache, unspecified, not intractable: Secondary | ICD-10-CM | POA: Diagnosis not present

## 2020-08-20 DIAGNOSIS — M4215 Adult osteochondrosis of spine, thoracolumbar region: Secondary | ICD-10-CM | POA: Diagnosis not present

## 2020-08-20 DIAGNOSIS — M4325 Fusion of spine, thoracolumbar region: Secondary | ICD-10-CM | POA: Diagnosis not present

## 2020-08-20 DIAGNOSIS — M9901 Segmental and somatic dysfunction of cervical region: Secondary | ICD-10-CM | POA: Diagnosis not present

## 2020-08-27 DIAGNOSIS — R030 Elevated blood-pressure reading, without diagnosis of hypertension: Secondary | ICD-10-CM | POA: Diagnosis not present

## 2020-08-27 DIAGNOSIS — M47812 Spondylosis without myelopathy or radiculopathy, cervical region: Secondary | ICD-10-CM | POA: Diagnosis not present

## 2020-08-28 DIAGNOSIS — F419 Anxiety disorder, unspecified: Secondary | ICD-10-CM | POA: Diagnosis not present

## 2020-08-29 DIAGNOSIS — J301 Allergic rhinitis due to pollen: Secondary | ICD-10-CM | POA: Diagnosis not present

## 2020-08-29 DIAGNOSIS — J3081 Allergic rhinitis due to animal (cat) (dog) hair and dander: Secondary | ICD-10-CM | POA: Diagnosis not present

## 2020-08-29 DIAGNOSIS — J3089 Other allergic rhinitis: Secondary | ICD-10-CM | POA: Diagnosis not present

## 2020-09-02 ENCOUNTER — Telehealth (INDEPENDENT_AMBULATORY_CARE_PROVIDER_SITE_OTHER): Payer: BC Managed Care – PPO | Admitting: Family Medicine

## 2020-09-02 ENCOUNTER — Telehealth: Payer: BC Managed Care – PPO | Admitting: Family Medicine

## 2020-09-02 DIAGNOSIS — R059 Cough, unspecified: Secondary | ICD-10-CM

## 2020-09-02 DIAGNOSIS — R0981 Nasal congestion: Secondary | ICD-10-CM | POA: Diagnosis not present

## 2020-09-02 DIAGNOSIS — R05 Cough: Secondary | ICD-10-CM

## 2020-09-02 NOTE — Progress Notes (Signed)
Virtual Visit via Video Note  I connected with Bernard Dalton  on 09/02/20 at  3:00 PM EDT by a video enabled telemedicine application and verified that I am speaking with the correct person using two identifiers.  Location patient: home, [place] Location provider:work or home office Persons participating in the virtual visit: patient, provider  I discussed the limitations of evaluation and management by telemedicine and the availability of in person appointments. The patient expressed understanding and agreed to proceed.   HPI:  Acute telemedicine visit for cough and congestion: -Onset: 08/30/2020 -Symptoms include: nasal congestion, feels a little run down, cough, PND -Denies:fevers, loss of taste or smell, NVD, SOB, body aches, headache change from baseline -Has tried: -Several family members with the same symptoms - wife got tested yesterday -kids are in school - they have similar symptoms -Pertinent past medical history: Allergic rhinitis -Pertinent medication allergies: -COVID-19 vaccine status: fully vaccinated, pfizer, 2nd dose  ROS: See pertinent positives and negatives per HPI.  Past Medical History:  Diagnosis Date  . ADD (attention deficit disorder)    Started seeing Dr. Stevenson at [facility] 07/2019->adderall changed to vyvanse, stable on 20mg qd as of 12/2019  . Allergic rhinitis    + allerg conjunctivitis.  Immunotherapy started 2019  . Anxiety and depression   . Blood transfusion without reported diagnosis 07/16/2019  . Chronic prostatitis    chronic irritative voiding symptoms: pelvic PT via [facility] helping as of 07/2016  . Concussion   . DDD (degenerative disc disease), cervical 08/2018   (Dr. Saullo):  C5-C6 --ESI 25% improvement in fall 2019.  Pt desiring C spine surgery to see if this alleviates his symptom complex..  . Diverticulosis    with 'itis per pt report; however, review of old record CT reports shows mild SB enteritis with mesenteric  adenopathy 06/2015; then 11/2014 CT abd/pelv done for urinary frequency and hematuria showed NORMAL abd/pelv  . Elevation of optic disc, bilateral 2019   Normal variant per ophtho--Dr. Hageman.  . Epigastric burning sensation 2020   EGD 08/13/19->gastritis. Continue daily PPI + H2 blocker  . 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 papilledema on optho exam.  Dr. Sater with neuro saw him 04/2018, no papilledema noted and opening CSF pressure was nl.  CSF labs nl.  ESR and ANA normal.  No clear reason for HA's found.  Imipr trial no help.  Trig pt inj's in occipi mm's 05/2018 by Dr. Sater. no help.Topamax trial 02/2019.  . Headache syndrome 02/2018   As of 07/2018, pt has plan to see Dr. Cohen, NS/spine specialist as next step (his HAs are likely cervicogenic).  Dr. Saullo did C7-T1 ESI on 09/11/18--25% improvement.   . IBS (irritable bowel syndrome)    diarr predom as per old PCP records  . Nephrolithiasis    Nonobstructive, noted on CT (1-2 mm)-has never passed a stone that he knows of.  . Panic   . 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 Seasonal allergies   . Spondylosis without myelopathy or radiculopathy, thoracic region 2019  . Vision abnormalities   . Vitamin B12 deficiency 05/2019   intrinsic factor ab NEG. B12 normalized with oral B12    Past Surgical History:  Procedure Laterality Date  . APPLICATION OF ROBOTIC ASSISTANCE FOR SPINAL PROCEDURE N/A 07/16/2019   Procedure: APPLICATION OF ROBOTIC ASSISTANCE FOR SPINAL PROCEDURE;  Surgeon: Elsner, Henry, MD;  Location:   [facility] [place] [facility];  Service: Neurosurgery;  Laterality: N/A;  . ESOPHAGOGASTRODUODENOSCOPY  08/13/2019   Antral gastritis; H pylori NEG  . LASIK Bilateral 2011  . POSTERIOR LUMBAR FUSION 4 LEVEL N/A 07/16/2019   Procedure: Thoracic Four to Lumbar Two posterior fusion, Kyphectomy with osteotomies, segmental  pedicle screw fixation;  Surgeon: Kristeen Miss, MD;  Location: [facility];  Service: Neurosurgery;  Laterality: N/A;  Thoracic Four to Lumbar Two posterior fusion, Kyphectomy with osteotomies, segmental pedicle screw fixation  . REFRACTIVE SURGERY Bilateral      Current Outpatient Medications:  .  celecoxib (CELEBREX) 100 MG capsule, Take 100 mg by mouth 2 (two) times daily., Disp: , Rfl:  .  fexofenadine (ALLEGRA) 180 MG tablet, Take 180 mg by mouth daily., Disp: , Rfl:  .  gabapentin (NEURONTIN) 300 MG capsule, Take one po qAM, one po qPM and teo po qHS (Patient taking differently: Take 300 mg by mouth 4 (four) times daily. ), Disp: 120 capsule, Rfl: 11 .  omeprazole (PRILOSEC) 40 MG capsule, Take 1 capsule (40 mg total) by mouth daily., Disp: 30 capsule, Rfl: 11 .  VYVANSE 20 MG capsule, Take 1 capsule by mouth every morning., Disp: , Rfl:   EXAM:  VITALS per patient if applicable:  GENERAL: alert, oriented, appears well and in no acute distress  HEENT: atraumatic, conjunttiva clear, no obvious abnormalities on inspection of external nose and ears  NECK: normal movements of the head and neck  LUNGS: on inspection no signs of respiratory distress, breathing rate appears normal, no obvious gross SOB, gasping or wheezing, no cough during this visit   CV: no obvious cyanosis  MS: moves all visible extremities without noticeable abnormality  PSYCH/NEURO: pleasant and cooperative, no obvious depression or anxiety, speech and thought processing grossly intact  ASSESSMENT AND PLAN:  Discussed the following assessment and plan:  Nasal congestion  Cough  -we discussed possible serious and likely etiologies, options for evaluation and workup, limitations of telemedicine visit vs in person visit, treatment, treatment risks and precautions. Pt prefers to treat via telemedicine empirically rather then risking or undertaking an in person visit at this moment.  Query viral upper respiratory  infection versus other.  Discussed possibility of mild influenza, though seems less likely without fevers.  Discussed possibility of COVID-19 breakthrough infection; patient is fully vaccinated.  Discussed options for Covid testing, typical course, treatment options, potential complications and precautions.  Advised nasal saline, Afrin nasal spray short course of 3 days, staying hydrated, analgesics if needed and staying home while sick.  Advised isolation per CDC guidelines of COVID-19 testing is positive.  He did report that he thinks his primary care office is now doing COVID-19 testing.  I advised that he contact them by phone to confirm and to schedule a testing appointment if that is something that they offer.   Work/School slipped offered:declined Advised to seek prompt follow up telemedicine visit or in person care if worsening, new symptoms arise, or if is not improving with treatment. Did let this patient know that I only do telemedicine on Tuesdays and Thursdays for [place]. Advised to schedule follow up visit with PCP or [facility] if any further questions or concerns to avoid delays in care.   I discussed the assessment and treatment plan with the patient. The patient was provided an opportunity to ask questions and all were answered. The patient agreed with the plan and demonstrated an understanding of the instructions.     Lucretia Kern, DO

## 2020-09-02 NOTE — Patient Instructions (Addendum)
-  stay home while sick, and if you have [place] please stay home for a full 10 days since the onset of symptoms PLUS one day of no fever and feeling better  -[place] COVID19 testing information: https://www.[facility].org/ OR 727-242-2179  -can use tylenol or aleve if needed for fevers, aches and pains per instructions  -can use nasal saline a few times per day if nasal congestion, sometime a short course of Afrin nasal spray for 3 days can help as well  -stay hydrated, drink plenty of fluids and eat small healthy meals - avoid dairy  -can take 1000 IU Vit D3 and Vit C lozenges per instructions  -follow up with your doctor in 2-3 days unless improving and feeling better  I hope you are feeling better soon! Seek in-person care or a follow up telemedicine visit promptly if your symptoms worsen, new concerns arise or you are not improving as expected. Call 911 if severe symptoms.

## 2020-09-03 ENCOUNTER — Telehealth: Payer: BC Managed Care – PPO | Admitting: Family Medicine

## 2020-09-03 DIAGNOSIS — J301 Allergic rhinitis due to pollen: Secondary | ICD-10-CM | POA: Diagnosis not present

## 2020-09-03 DIAGNOSIS — J3089 Other allergic rhinitis: Secondary | ICD-10-CM | POA: Diagnosis not present

## 2020-09-03 DIAGNOSIS — J3081 Allergic rhinitis due to animal (cat) (dog) hair and dander: Secondary | ICD-10-CM | POA: Diagnosis not present

## 2020-09-08 DIAGNOSIS — J3081 Allergic rhinitis due to animal (cat) (dog) hair and dander: Secondary | ICD-10-CM | POA: Diagnosis not present

## 2020-09-08 DIAGNOSIS — J3089 Other allergic rhinitis: Secondary | ICD-10-CM | POA: Diagnosis not present

## 2020-09-08 DIAGNOSIS — J301 Allergic rhinitis due to pollen: Secondary | ICD-10-CM | POA: Diagnosis not present

## 2020-09-09 DIAGNOSIS — F419 Anxiety disorder, unspecified: Secondary | ICD-10-CM | POA: Diagnosis not present

## 2020-09-10 DIAGNOSIS — J3089 Other allergic rhinitis: Secondary | ICD-10-CM | POA: Diagnosis not present

## 2020-09-10 DIAGNOSIS — J3081 Allergic rhinitis due to animal (cat) (dog) hair and dander: Secondary | ICD-10-CM | POA: Diagnosis not present

## 2020-09-10 DIAGNOSIS — J301 Allergic rhinitis due to pollen: Secondary | ICD-10-CM | POA: Diagnosis not present

## 2020-09-15 DIAGNOSIS — J301 Allergic rhinitis due to pollen: Secondary | ICD-10-CM | POA: Diagnosis not present

## 2020-09-15 DIAGNOSIS — J3089 Other allergic rhinitis: Secondary | ICD-10-CM | POA: Diagnosis not present

## 2020-09-15 DIAGNOSIS — J3081 Allergic rhinitis due to animal (cat) (dog) hair and dander: Secondary | ICD-10-CM | POA: Diagnosis not present

## 2020-09-24 DIAGNOSIS — F4322 Adjustment disorder with anxiety: Secondary | ICD-10-CM | POA: Diagnosis not present

## 2020-09-25 DIAGNOSIS — F419 Anxiety disorder, unspecified: Secondary | ICD-10-CM | POA: Diagnosis not present

## 2020-09-26 DIAGNOSIS — J3081 Allergic rhinitis due to animal (cat) (dog) hair and dander: Secondary | ICD-10-CM | POA: Diagnosis not present

## 2020-09-26 DIAGNOSIS — Z79899 Other long term (current) drug therapy: Secondary | ICD-10-CM | POA: Diagnosis not present

## 2020-09-26 DIAGNOSIS — F902 Attention-deficit hyperactivity disorder, combined type: Secondary | ICD-10-CM | POA: Diagnosis not present

## 2020-09-26 DIAGNOSIS — J301 Allergic rhinitis due to pollen: Secondary | ICD-10-CM | POA: Diagnosis not present

## 2020-09-26 DIAGNOSIS — J3089 Other allergic rhinitis: Secondary | ICD-10-CM | POA: Diagnosis not present

## 2020-09-29 DIAGNOSIS — J301 Allergic rhinitis due to pollen: Secondary | ICD-10-CM | POA: Diagnosis not present

## 2020-09-29 DIAGNOSIS — J3081 Allergic rhinitis due to animal (cat) (dog) hair and dander: Secondary | ICD-10-CM | POA: Diagnosis not present

## 2020-09-29 DIAGNOSIS — J3089 Other allergic rhinitis: Secondary | ICD-10-CM | POA: Diagnosis not present

## 2020-09-30 DIAGNOSIS — F900 Attention-deficit hyperactivity disorder, predominantly inattentive type: Secondary | ICD-10-CM | POA: Diagnosis not present

## 2020-09-30 DIAGNOSIS — F411 Generalized anxiety disorder: Secondary | ICD-10-CM | POA: Diagnosis not present

## 2020-09-30 DIAGNOSIS — F329 Major depressive disorder, single episode, unspecified: Secondary | ICD-10-CM | POA: Diagnosis not present

## 2020-10-01 DIAGNOSIS — J301 Allergic rhinitis due to pollen: Secondary | ICD-10-CM | POA: Diagnosis not present

## 2020-10-01 DIAGNOSIS — J3081 Allergic rhinitis due to animal (cat) (dog) hair and dander: Secondary | ICD-10-CM | POA: Diagnosis not present

## 2020-10-01 DIAGNOSIS — J3089 Other allergic rhinitis: Secondary | ICD-10-CM | POA: Diagnosis not present

## 2020-10-01 DIAGNOSIS — Z23 Encounter for immunization: Secondary | ICD-10-CM | POA: Diagnosis not present

## 2020-10-08 DIAGNOSIS — F4323 Adjustment disorder with mixed anxiety and depressed mood: Secondary | ICD-10-CM | POA: Diagnosis not present

## 2020-10-08 DIAGNOSIS — J301 Allergic rhinitis due to pollen: Secondary | ICD-10-CM | POA: Diagnosis not present

## 2020-10-08 DIAGNOSIS — J3089 Other allergic rhinitis: Secondary | ICD-10-CM | POA: Diagnosis not present

## 2020-10-08 DIAGNOSIS — J3081 Allergic rhinitis due to animal (cat) (dog) hair and dander: Secondary | ICD-10-CM | POA: Diagnosis not present

## 2020-10-10 DIAGNOSIS — J3081 Allergic rhinitis due to animal (cat) (dog) hair and dander: Secondary | ICD-10-CM | POA: Diagnosis not present

## 2020-10-10 DIAGNOSIS — J301 Allergic rhinitis due to pollen: Secondary | ICD-10-CM | POA: Diagnosis not present

## 2020-10-10 DIAGNOSIS — J3089 Other allergic rhinitis: Secondary | ICD-10-CM | POA: Diagnosis not present

## 2020-10-13 DIAGNOSIS — J301 Allergic rhinitis due to pollen: Secondary | ICD-10-CM | POA: Diagnosis not present

## 2020-10-13 DIAGNOSIS — J3081 Allergic rhinitis due to animal (cat) (dog) hair and dander: Secondary | ICD-10-CM | POA: Diagnosis not present

## 2020-10-13 DIAGNOSIS — J3089 Other allergic rhinitis: Secondary | ICD-10-CM | POA: Diagnosis not present

## 2020-10-14 DIAGNOSIS — R030 Elevated blood-pressure reading, without diagnosis of hypertension: Secondary | ICD-10-CM | POA: Diagnosis not present

## 2020-10-14 DIAGNOSIS — M47812 Spondylosis without myelopathy or radiculopathy, cervical region: Secondary | ICD-10-CM | POA: Diagnosis not present

## 2020-10-16 DIAGNOSIS — F419 Anxiety disorder, unspecified: Secondary | ICD-10-CM | POA: Diagnosis not present

## 2020-10-17 DIAGNOSIS — F332 Major depressive disorder, recurrent severe without psychotic features: Secondary | ICD-10-CM | POA: Diagnosis not present

## 2020-10-21 DIAGNOSIS — J301 Allergic rhinitis due to pollen: Secondary | ICD-10-CM | POA: Diagnosis not present

## 2020-10-21 DIAGNOSIS — F4323 Adjustment disorder with mixed anxiety and depressed mood: Secondary | ICD-10-CM | POA: Diagnosis not present

## 2020-10-21 DIAGNOSIS — F332 Major depressive disorder, recurrent severe without psychotic features: Secondary | ICD-10-CM | POA: Diagnosis not present

## 2020-10-21 DIAGNOSIS — J3089 Other allergic rhinitis: Secondary | ICD-10-CM | POA: Diagnosis not present

## 2020-10-21 DIAGNOSIS — J3081 Allergic rhinitis due to animal (cat) (dog) hair and dander: Secondary | ICD-10-CM | POA: Diagnosis not present

## 2020-10-21 DIAGNOSIS — G43709 Chronic migraine without aura, not intractable, without status migrainosus: Secondary | ICD-10-CM | POA: Diagnosis not present

## 2020-10-22 DIAGNOSIS — F332 Major depressive disorder, recurrent severe without psychotic features: Secondary | ICD-10-CM | POA: Diagnosis not present

## 2020-10-23 DIAGNOSIS — J3081 Allergic rhinitis due to animal (cat) (dog) hair and dander: Secondary | ICD-10-CM | POA: Diagnosis not present

## 2020-10-23 DIAGNOSIS — H1045 Other chronic allergic conjunctivitis: Secondary | ICD-10-CM | POA: Diagnosis not present

## 2020-10-23 DIAGNOSIS — J3089 Other allergic rhinitis: Secondary | ICD-10-CM | POA: Diagnosis not present

## 2020-10-23 DIAGNOSIS — F332 Major depressive disorder, recurrent severe without psychotic features: Secondary | ICD-10-CM | POA: Diagnosis not present

## 2020-10-23 DIAGNOSIS — J301 Allergic rhinitis due to pollen: Secondary | ICD-10-CM | POA: Diagnosis not present

## 2020-10-24 DIAGNOSIS — F332 Major depressive disorder, recurrent severe without psychotic features: Secondary | ICD-10-CM | POA: Diagnosis not present

## 2020-10-27 DIAGNOSIS — F332 Major depressive disorder, recurrent severe without psychotic features: Secondary | ICD-10-CM | POA: Diagnosis not present

## 2020-10-28 DIAGNOSIS — F332 Major depressive disorder, recurrent severe without psychotic features: Secondary | ICD-10-CM | POA: Diagnosis not present

## 2020-10-29 DIAGNOSIS — F332 Major depressive disorder, recurrent severe without psychotic features: Secondary | ICD-10-CM | POA: Diagnosis not present

## 2020-10-29 DIAGNOSIS — J3089 Other allergic rhinitis: Secondary | ICD-10-CM | POA: Diagnosis not present

## 2020-10-29 DIAGNOSIS — J3081 Allergic rhinitis due to animal (cat) (dog) hair and dander: Secondary | ICD-10-CM | POA: Diagnosis not present

## 2020-10-29 DIAGNOSIS — J301 Allergic rhinitis due to pollen: Secondary | ICD-10-CM | POA: Diagnosis not present

## 2020-10-30 DIAGNOSIS — F332 Major depressive disorder, recurrent severe without psychotic features: Secondary | ICD-10-CM | POA: Diagnosis not present

## 2020-10-30 DIAGNOSIS — F419 Anxiety disorder, unspecified: Secondary | ICD-10-CM | POA: Diagnosis not present

## 2020-10-31 DIAGNOSIS — F332 Major depressive disorder, recurrent severe without psychotic features: Secondary | ICD-10-CM | POA: Diagnosis not present

## 2020-10-31 DIAGNOSIS — F4322 Adjustment disorder with anxiety: Secondary | ICD-10-CM | POA: Diagnosis not present

## 2020-11-03 DIAGNOSIS — F332 Major depressive disorder, recurrent severe without psychotic features: Secondary | ICD-10-CM | POA: Diagnosis not present

## 2020-11-03 DIAGNOSIS — J3089 Other allergic rhinitis: Secondary | ICD-10-CM | POA: Diagnosis not present

## 2020-11-03 DIAGNOSIS — J3081 Allergic rhinitis due to animal (cat) (dog) hair and dander: Secondary | ICD-10-CM | POA: Diagnosis not present

## 2020-11-03 DIAGNOSIS — J301 Allergic rhinitis due to pollen: Secondary | ICD-10-CM | POA: Diagnosis not present

## 2020-11-04 DIAGNOSIS — F332 Major depressive disorder, recurrent severe without psychotic features: Secondary | ICD-10-CM | POA: Diagnosis not present

## 2020-11-10 DIAGNOSIS — F332 Major depressive disorder, recurrent severe without psychotic features: Secondary | ICD-10-CM | POA: Diagnosis not present

## 2020-11-11 DIAGNOSIS — J3089 Other allergic rhinitis: Secondary | ICD-10-CM | POA: Diagnosis not present

## 2020-11-11 DIAGNOSIS — J3081 Allergic rhinitis due to animal (cat) (dog) hair and dander: Secondary | ICD-10-CM | POA: Diagnosis not present

## 2020-11-11 DIAGNOSIS — M47812 Spondylosis without myelopathy or radiculopathy, cervical region: Secondary | ICD-10-CM | POA: Diagnosis not present

## 2020-11-11 DIAGNOSIS — F332 Major depressive disorder, recurrent severe without psychotic features: Secondary | ICD-10-CM | POA: Diagnosis not present

## 2020-11-11 DIAGNOSIS — J301 Allergic rhinitis due to pollen: Secondary | ICD-10-CM | POA: Diagnosis not present

## 2020-11-11 DIAGNOSIS — R030 Elevated blood-pressure reading, without diagnosis of hypertension: Secondary | ICD-10-CM | POA: Diagnosis not present

## 2020-11-12 DIAGNOSIS — G43719 Chronic migraine without aura, intractable, without status migrainosus: Secondary | ICD-10-CM | POA: Diagnosis not present

## 2020-11-12 DIAGNOSIS — F332 Major depressive disorder, recurrent severe without psychotic features: Secondary | ICD-10-CM | POA: Diagnosis not present

## 2020-11-13 DIAGNOSIS — F332 Major depressive disorder, recurrent severe without psychotic features: Secondary | ICD-10-CM | POA: Diagnosis not present

## 2020-11-14 DIAGNOSIS — F332 Major depressive disorder, recurrent severe without psychotic features: Secondary | ICD-10-CM | POA: Diagnosis not present

## 2020-11-14 DIAGNOSIS — F4323 Adjustment disorder with mixed anxiety and depressed mood: Secondary | ICD-10-CM | POA: Diagnosis not present

## 2020-11-17 DIAGNOSIS — F332 Major depressive disorder, recurrent severe without psychotic features: Secondary | ICD-10-CM | POA: Diagnosis not present

## 2020-11-17 DIAGNOSIS — J301 Allergic rhinitis due to pollen: Secondary | ICD-10-CM | POA: Diagnosis not present

## 2020-11-17 DIAGNOSIS — J3081 Allergic rhinitis due to animal (cat) (dog) hair and dander: Secondary | ICD-10-CM | POA: Diagnosis not present

## 2020-11-18 DIAGNOSIS — F332 Major depressive disorder, recurrent severe without psychotic features: Secondary | ICD-10-CM | POA: Diagnosis not present

## 2020-11-18 DIAGNOSIS — J3089 Other allergic rhinitis: Secondary | ICD-10-CM | POA: Diagnosis not present

## 2020-11-19 DIAGNOSIS — F332 Major depressive disorder, recurrent severe without psychotic features: Secondary | ICD-10-CM | POA: Diagnosis not present

## 2020-11-20 DIAGNOSIS — F411 Generalized anxiety disorder: Secondary | ICD-10-CM | POA: Diagnosis not present

## 2020-11-20 DIAGNOSIS — F329 Major depressive disorder, single episode, unspecified: Secondary | ICD-10-CM | POA: Diagnosis not present

## 2020-11-20 DIAGNOSIS — F900 Attention-deficit hyperactivity disorder, predominantly inattentive type: Secondary | ICD-10-CM | POA: Diagnosis not present

## 2020-11-20 DIAGNOSIS — F332 Major depressive disorder, recurrent severe without psychotic features: Secondary | ICD-10-CM | POA: Diagnosis not present

## 2020-11-21 DIAGNOSIS — J3089 Other allergic rhinitis: Secondary | ICD-10-CM | POA: Diagnosis not present

## 2020-11-21 DIAGNOSIS — J3081 Allergic rhinitis due to animal (cat) (dog) hair and dander: Secondary | ICD-10-CM | POA: Diagnosis not present

## 2020-11-21 DIAGNOSIS — F332 Major depressive disorder, recurrent severe without psychotic features: Secondary | ICD-10-CM | POA: Diagnosis not present

## 2020-11-21 DIAGNOSIS — J301 Allergic rhinitis due to pollen: Secondary | ICD-10-CM | POA: Diagnosis not present

## 2020-11-24 DIAGNOSIS — J3089 Other allergic rhinitis: Secondary | ICD-10-CM | POA: Diagnosis not present

## 2020-11-24 DIAGNOSIS — J3081 Allergic rhinitis due to animal (cat) (dog) hair and dander: Secondary | ICD-10-CM | POA: Diagnosis not present

## 2020-11-24 DIAGNOSIS — J301 Allergic rhinitis due to pollen: Secondary | ICD-10-CM | POA: Diagnosis not present

## 2020-11-24 DIAGNOSIS — F332 Major depressive disorder, recurrent severe without psychotic features: Secondary | ICD-10-CM | POA: Diagnosis not present

## 2020-11-25 DIAGNOSIS — F332 Major depressive disorder, recurrent severe without psychotic features: Secondary | ICD-10-CM | POA: Diagnosis not present

## 2020-11-26 DIAGNOSIS — F332 Major depressive disorder, recurrent severe without psychotic features: Secondary | ICD-10-CM | POA: Diagnosis not present

## 2020-11-27 DIAGNOSIS — F332 Major depressive disorder, recurrent severe without psychotic features: Secondary | ICD-10-CM | POA: Diagnosis not present

## 2020-11-28 DIAGNOSIS — F332 Major depressive disorder, recurrent severe without psychotic features: Secondary | ICD-10-CM | POA: Diagnosis not present

## 2020-11-28 DIAGNOSIS — F4323 Adjustment disorder with mixed anxiety and depressed mood: Secondary | ICD-10-CM | POA: Diagnosis not present

## 2020-11-28 DIAGNOSIS — J3081 Allergic rhinitis due to animal (cat) (dog) hair and dander: Secondary | ICD-10-CM | POA: Diagnosis not present

## 2020-11-28 DIAGNOSIS — J301 Allergic rhinitis due to pollen: Secondary | ICD-10-CM | POA: Diagnosis not present

## 2020-11-28 DIAGNOSIS — J3089 Other allergic rhinitis: Secondary | ICD-10-CM | POA: Diagnosis not present

## 2020-12-01 DIAGNOSIS — J3081 Allergic rhinitis due to animal (cat) (dog) hair and dander: Secondary | ICD-10-CM | POA: Diagnosis not present

## 2020-12-01 DIAGNOSIS — J301 Allergic rhinitis due to pollen: Secondary | ICD-10-CM | POA: Diagnosis not present

## 2020-12-01 DIAGNOSIS — F332 Major depressive disorder, recurrent severe without psychotic features: Secondary | ICD-10-CM | POA: Diagnosis not present

## 2020-12-01 DIAGNOSIS — J3089 Other allergic rhinitis: Secondary | ICD-10-CM | POA: Diagnosis not present

## 2020-12-02 DIAGNOSIS — F332 Major depressive disorder, recurrent severe without psychotic features: Secondary | ICD-10-CM | POA: Diagnosis not present

## 2020-12-03 DIAGNOSIS — J301 Allergic rhinitis due to pollen: Secondary | ICD-10-CM | POA: Diagnosis not present

## 2020-12-03 DIAGNOSIS — F332 Major depressive disorder, recurrent severe without psychotic features: Secondary | ICD-10-CM | POA: Diagnosis not present

## 2020-12-03 DIAGNOSIS — J3089 Other allergic rhinitis: Secondary | ICD-10-CM | POA: Diagnosis not present

## 2020-12-03 DIAGNOSIS — J3081 Allergic rhinitis due to animal (cat) (dog) hair and dander: Secondary | ICD-10-CM | POA: Diagnosis not present

## 2020-12-04 DIAGNOSIS — F332 Major depressive disorder, recurrent severe without psychotic features: Secondary | ICD-10-CM | POA: Diagnosis not present

## 2020-12-08 DIAGNOSIS — J301 Allergic rhinitis due to pollen: Secondary | ICD-10-CM | POA: Diagnosis not present

## 2020-12-08 DIAGNOSIS — J3089 Other allergic rhinitis: Secondary | ICD-10-CM | POA: Diagnosis not present

## 2020-12-08 DIAGNOSIS — F332 Major depressive disorder, recurrent severe without psychotic features: Secondary | ICD-10-CM | POA: Diagnosis not present

## 2020-12-08 DIAGNOSIS — J3081 Allergic rhinitis due to animal (cat) (dog) hair and dander: Secondary | ICD-10-CM | POA: Diagnosis not present

## 2020-12-09 DIAGNOSIS — F332 Major depressive disorder, recurrent severe without psychotic features: Secondary | ICD-10-CM | POA: Diagnosis not present

## 2020-12-10 DIAGNOSIS — J3081 Allergic rhinitis due to animal (cat) (dog) hair and dander: Secondary | ICD-10-CM | POA: Diagnosis not present

## 2020-12-10 DIAGNOSIS — J3089 Other allergic rhinitis: Secondary | ICD-10-CM | POA: Diagnosis not present

## 2020-12-10 DIAGNOSIS — F4323 Adjustment disorder with mixed anxiety and depressed mood: Secondary | ICD-10-CM | POA: Diagnosis not present

## 2020-12-10 DIAGNOSIS — J301 Allergic rhinitis due to pollen: Secondary | ICD-10-CM | POA: Diagnosis not present

## 2020-12-11 DIAGNOSIS — F332 Major depressive disorder, recurrent severe without psychotic features: Secondary | ICD-10-CM | POA: Diagnosis not present

## 2020-12-30 DIAGNOSIS — M7918 Myalgia, other site: Secondary | ICD-10-CM | POA: Insufficient documentation

## 2020-12-30 DIAGNOSIS — G43709 Chronic migraine without aura, not intractable, without status migrainosus: Secondary | ICD-10-CM | POA: Insufficient documentation

## 2021-01-02 ENCOUNTER — Encounter: Payer: Self-pay | Admitting: Family Medicine

## 2021-01-02 ENCOUNTER — Telehealth (INDEPENDENT_AMBULATORY_CARE_PROVIDER_SITE_OTHER): Payer: 59 | Admitting: Family Medicine

## 2021-01-02 VITALS — BP 125/78 | HR 63 | Temp 98.0°F | Ht 69.0 in | Wt 174.0 lb

## 2021-01-02 DIAGNOSIS — J069 Acute upper respiratory infection, unspecified: Secondary | ICD-10-CM

## 2021-01-02 DIAGNOSIS — J3081 Allergic rhinitis due to animal (cat) (dog) hair and dander: Secondary | ICD-10-CM | POA: Insufficient documentation

## 2021-01-02 NOTE — Progress Notes (Signed)
Virtual Visit via Video Note  I connected with Bernard Dalton on 01/02/21 at  4:00 PM EST by a video enabled telemedicine application and verified that I am speaking with the correct person using two identifiers.  Location patient: home, [place] Location provider:work or home office Persons participating in the virtual visit: patient, provider  I discussed the limitations of evaluation and management by telemedicine and the availability of in person appointments. The patient expressed understanding and agreed to proceed.  Telemedicine visit is a necessity given the COVID-19 restrictions in place at the current time.  HPI: 40 y/o WM being seen today for cough. Onset 1 wk ago cough that is somewhat productive.  No HA, fever, no fatigue, no body aches.  No n/v/d, no changes in taste/smell.  Neg at-home covid test. Slight/minimal stuffy/runny nose.  No ST or HA.  No wheezing. Daughter with ear infxn and sinus infection.  ROS: See pertinent positives and negatives per HPI.  Past Medical History:  Diagnosis Date   ADD (attention deficit disorder)    Started seeing Dr. Johnnye Sima at [facility] 07/2019->adderall changed to vyvanse, stable on 67m qd as of 12/2019   Allergic rhinitis    + allerg conjunctivitis.  Immunotherapy started 2019   Anxiety and depression    Blood transfusion without reported diagnosis 07/16/2019   Chronic prostatitis    chronic irritative voiding symptoms: pelvic Bernard Dalton via [facility] helping as of 07/2016   Concussion    DDD (degenerative disc disease), cervical 08/2018   (Dr. SMaia Petties:  C5-C6 --ESI 25% improvement in fall 2019.  Bernard Dalton desiring C spine surgery to see if this alleviates his symptom complex..   Diverticulosis    with 'itis per Bernard Dalton report; however, review of old record CT reports shows mild SB enteritis with mesenteric adenopathy 06/2015; then 11/2014 CT abd/pelv done for urinary frequency and hematuria showed NORMAL abd/pelv   Elevation of optic disc,  bilateral 2019   Normal variant per ophtho--Dr. HLarose Kells   Epigastric burning sensation 2020   EGD 08/13/19->gastritis. 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 papilledema on optho exam.  Dr. SFelecia Shellingwith neuro saw him 04/2018, no papilledema noted and opening CSF pressure was nl.  CSF labs nl.  ESR and ANA normal.  No clear reason for HA's found.  Imipr trial no help.  Trig Bernard Dalton inj's in occipi mm's 05/2018 by Dr. SFelecia Shelling no help.Topamax trial 02/2019.   Headache syndrome 02/2018   As of 07/2018, Bernard Dalton has plan to see Dr. CPatrice Paradise NS/spine specialist as next step (his HAs are likely cervicogenic).  Dr. SMaia Pettiesdid C7-T1 ESI on 09/11/18--25% improvement.    IBS (irritable bowel syndrome)    diarr predom as per old PCP records   Nephrolithiasis    Nonobstructive, noted on CT (1-2 mm)-has never passed a stone that he knows of.   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 B12 normalized with oral B12    Past Surgical History:  Procedure Laterality Date   APPLICATION OF ROBOTIC ASSISTANCE FOR SPINAL PROCEDURE N/A 07/16/2019   Procedure: APPLICATION OF ROBOTIC ASSISTANCE FOR SPINAL PROCEDURE;  Surgeon: EKristeen Miss MD;  Location: [facility]  Service: Neurosurgery;  Laterality: N/A;   ESOPHAGOGASTRODUODENOSCOPY  08/13/2019   Antral gastritis; H pylori NEG   LASIK Bilateral 2011   POSTERIOR LUMBAR FUSION 4 LEVEL N/A 07/16/2019   Procedure: Thoracic Four to Lumbar Two posterior fusion, Kyphectomy with osteotomies, segmental pedicle screw fixation;  Surgeon: Kristeen Miss, MD;  Location: [place];  Service: Neurosurgery;  Laterality: N/A;  Thoracic Four to Lumbar Two  posterior fusion, Kyphectomy with osteotomies, segmental pedicle screw fixation   REFRACTIVE SURGERY Bilateral      Current Outpatient Medications:    celecoxib (CELEBREX) 100 MG capsule, Take 100 mg by mouth 2 (two) times daily., Disp: , Rfl:    fexofenadine (ALLEGRA) 180 MG tablet, Take 180 mg by mouth daily., Disp: , Rfl:    gabapentin (NEURONTIN) 300 MG capsule, Take one po qAM, one po qPM and teo po qHS (Patient taking differently: Take 300 mg by mouth 4 (four) times daily.), Disp: 120 capsule, Rfl: 11   VYVANSE 20 MG capsule, Take 1 capsule by mouth every morning., Disp: , Rfl:   EXAM:  VITALS per patient if applicable:  Vitals with BMI 01/02/2021 04/11/2020 09/25/2019  Height 5' 9"  6' 0"  6' 0"   Weight 174 lbs 166 lbs 10 oz 157 lbs 13 oz  BMI 25.68 15.72 62.0  Systolic 355 974 163  Diastolic 78 80 78  Pulse 63 72 82     GENERAL: alert, oriented, appears well and in no acute distress  HEENT: atraumatic, conjunttiva clear, no obvious abnormalities on inspection of external nose and ears  NECK: normal movements of the head and neck  LUNGS: on inspection no signs of respiratory distress, breathing rate appears normal, no obvious gross SOB, gasping or wheezing  CV: no obvious cyanosis  MS: moves all visible extremities without noticeable abnormality  PSYCH/NEURO: pleasant and cooperative, no obvious depression or anxiety, speech and thought processing grossly intact  LABS: none today    Chemistry      Component Value Date/Time   NA 141 04/11/2020 0823   K 4.8 04/11/2020 0823   CL 103 04/11/2020 0823   CO2 33 (H) 04/11/2020 0823   BUN 10 04/11/2020 0823   CREATININE 0.91 04/11/2020 0823   CREATININE 1.04 07/29/2016 0803      Component Value Date/Time   CALCIUM 9.4 04/11/2020 0823   ALKPHOS 64 04/11/2020 0823   AST 11 04/11/2020 0823   ALT 12 04/11/2020 0823   BILITOT 1.0 04/11/2020 0823     ASSESSMENT AND PLAN:  Discussed the following assessment and  plan:  URI with cough/congestion, viral etiology suspected. No systemic signs. Covid at-home test neg. Symptomatic care with otc robitussin dm discussed. Signs/symptoms to call or return for were reviewed and Bernard Dalton expressed understanding.  I discussed the assessment and treatment plan with the patient. The patient was provided an opportunity to ask questions and all were answered. The patient agreed with the plan and demonstrated an understanding of the instructions.   F/u: if not improving signif in 5-7d  Signed:  Crissie Sickles, MD           01/02/2021

## 2021-01-23 ENCOUNTER — Telehealth: Payer: Self-pay

## 2021-01-23 NOTE — Telephone Encounter (Signed)
Need appointment prior to scan for re-eval

## 2021-01-23 NOTE — Telephone Encounter (Signed)
Pt called and states that the last time he was seen in the office Dr. Marlou Sa recommended him getting an MRI.  Pt would like to go ahead and proceed with the MRI if possible as the pain hasn't subsided

## 2021-01-23 NOTE — Telephone Encounter (Signed)
Last seen in jan 2021. Ok to order?

## 2021-01-26 NOTE — Telephone Encounter (Signed)
Called and scheduled appointment.

## 2021-02-04 ENCOUNTER — Other Ambulatory Visit: Payer: Self-pay

## 2021-02-04 ENCOUNTER — Ambulatory Visit: Payer: 59 | Admitting: Orthopedic Surgery

## 2021-02-04 DIAGNOSIS — M25512 Pain in left shoulder: Secondary | ICD-10-CM

## 2021-02-04 DIAGNOSIS — M25511 Pain in right shoulder: Secondary | ICD-10-CM

## 2021-02-08 ENCOUNTER — Encounter: Payer: Self-pay | Admitting: Orthopedic Surgery

## 2021-02-08 NOTE — Progress Notes (Signed)
Office Visit Note   Patient: Bernard Dalton           Date of Birth: 1981-05-28           MRN: [id number] Visit Date: 02/04/2021 Requested by: Tammi Sou, MD [address] [place],  [place] [address] PCP: Tammi Sou, MD  Subjective: Chief Complaint  Patient presents with  . Right Shoulder - Pain  . Left Shoulder - Pain    HPI: Bernard Dalton is a 40 year old patient with bilateral shoulder pain.  He does have a lot of muscle spasm in the trapezial region but his shoulder problems have become more severe since his last visit.  He works out on [employer] but his left shoulder more than the right shoulder has become problematic.  Describes definite mechanical symptoms in both shoulders as well as biceps pain anteriorly.  He does get epidural steroid injections in his neck.  The symptoms in his shoulder however have developed into more mechanical symptoms with anterior radiation.  Is very significant difficulty with overhead types of activities particularly coming across his body.  Denies any discrete injury.  He has tried an extensive course of rehabilitation along with medication but has not been able to achieve any relief in his shoulder symptoms.  Plan was for MRI scan after his last clinic visit but he wanted to work out everything he could with his neck first and he has done that.  Despite the marginal improvements in his neck his shoulder symptoms have now become predominant.              ROS: All systems reviewed are negative as they relate to the chief complaint within the history of present illness.  Patient denies  fevers or chills.   Assessment & Plan: Visit Diagnoses:  1. Bilateral shoulder pain, unspecified chronicity     Plan: Impression is bilateral shoulder pain with likely SLAP tears on the left and possibly on the right-hand side.  His symptoms match that potential prospect.  Rotator cuff strength is pretty good bilaterally but he does have positive O'Brien's testing  bilaterally as well as mechanical symptoms left more than right.  Due to duration of symptoms and failure of conservative management indication for MRI scan at this time is present.  Plan MRI arthrogram both shoulders to evaluate for superior labral pathology.  We will call him with the results.  Could make plans for that either injection intervention or surgical intervention depending on what they show.  Follow-Up Instructions: Return for after MRI.   Orders:  Orders Placed This Encounter  Procedures  . MR SHOULDER RIGHT W CONTRAST  . MR Shoulder Left w/ contrast  . Arthrogram  . Arthrogram   No orders of the defined types were placed in this encounter.     Procedures: No procedures performed   Clinical Data: No additional findings.  Objective: Vital Signs: There were no vitals taken for this visit.  Physical Exam:   Constitutional: Patient appears well-developed HEENT:  Head: Normocephalic Eyes:EOM are normal Neck: Normal range of motion Cardiovascular: Normal rate Pulmonary/chest: Effort normal Neurologic: Patient is alert Skin: Skin is warm Psychiatric: Patient has normal mood and affect    Ortho Exam: Ortho exam demonstrates pretty reasonable cervical spine range of motion.  Both shoulders have good rotator cuff strength to infraspinatus supraspinatus and subscap muscle testing.  No discrete AC joint tenderness bilaterally with no crepitus with crossarm adduction in the [facility] joint bilaterally.  Motor or  sensory function to the hand is intact.  Does have positive O'Brien's testing bilaterally with no restriction of external rotation of 15 degrees of abduction.  No other masses lymphadenopathy or skin changes noted in that shoulder girdle region.  Specialty Comments:  No specialty comments available.  Imaging: No results found.   PMFS History: Patient Active Problem List   Diagnosis Date Noted  . Allergic rhinitis due to animal (cat) (dog) hair and dander  01/02/2021  . Myofascial pain 12/30/2020  . Chronic migraine without aura without status migrainosus, not intractable 12/30/2020  . 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 Muscle spasm 05/25/2019  . Paresthesia 05/25/2019  . 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 Cervical spondylosis 01/10/2019  . Neck pain 05/25/2018  . Occipital neuralgia 05/25/2018  . New persistent daily headache 04/13/2018  . Vision disturbance 04/13/2018  . Papilledema 04/13/2018  . Kyphosis deformity of spine 01/03/2018  . Thoracic back pain 01/03/2018  . Adult ADHD 02/20/2016  . Anxiety and depression 02/20/2016  . Interstitial cystitis 08/27/2014   Past Medical History:  Diagnosis Date  . ADD (attention deficit disorder)    Started seeing Dr. Johnnye Sima at [facility] 07/2019->adderall changed to vyvanse, stable on 27m qd as of 12/2019  . Allergic rhinitis    + allerg conjunctivitis.  Immunotherapy started 2019  . Anxiety and depression   . Blood transfusion without reported diagnosis 07/16/2019  . Chronic prostatitis    chronic irritative voiding symptoms: pelvic PT via [facility] helping as of 07/2016  . Concussion   . DDD (degenerative disc disease), cervical 08/2018   (Dr. SMaia Petties:  C5-C6 --ESI 25% improvement in fall 2019.  Pt desiring C spine surgery to see if this alleviates his symptom complex..  . Diverticulosis    with 'itis per pt report; however, review of old record CT reports shows mild SB enteritis with mesenteric adenopathy 06/2015; then 11/2014 CT abd/pelv done for urinary frequency and hematuria showed NORMAL abd/pelv  . Elevation of optic disc, bilateral 2019   Normal variant per ophtho--Dr. HLarose Kells  .Marland KitchenEpigastric burning sensation 2020   EGD 08/13/19->gastritis. Continue daily PPI + H2 blocker  . 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 papilledema on optho exam.  Dr. SFelecia Shelling with neuro saw him 04/2018, no papilledema noted and opening CSF pressure was nl.  CSF labs nl.  ESR and ANA normal.  No clear reason for HA's found.  Imipr trial no help.  Trig pt inj's in occipi mm's 05/2018 by Dr. SFelecia Shelling no help.Topamax trial 02/2019.  .Marland KitchenHeadache syndrome 02/2018   As of 07/2018, pt has plan to see Dr. CPatrice Paradise NS/spine specialist as next step (his HAs are likely cervicogenic).  Dr. SMaia Pettiesdid C7-T1 ESI on 09/11/18--25% improvement.   . IBS (irritable bowel syndrome)    diarr predom as per old PCP records  . Nephrolithiasis    Nonobstructive, noted on CT (1-2 mm)-has never passed a stone that he knows of.  . Panic   . 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 Seasonal allergies   . Spondylosis without myelopathy or radiculopathy, thoracic region 2019  . Vision abnormalities   . Vitamin B12 deficiency 05/2019   intrinsic factor ab NEG. B12 normalized with oral B12    Family History  Problem Relation Age of Onset  . Alcohol abuse Mother   . Alcohol abuse Father   . Kidney disease Father   .  Alcohol abuse Maternal Grandmother   . Alcohol abuse Maternal Grandfather   . Stroke Paternal Grandmother   . Congestive Heart Failure Paternal Grandmother   . Alcohol abuse Paternal Grandmother   . Alcohol abuse Paternal Grandfather   . ADD / ADHD Brother   . ADD / ADHD Brother   . Stomach cancer Neg Hx   . Colon cancer Neg Hx   . Esophageal cancer Neg Hx   . Rectal cancer Neg Hx     Past Surgical History:  Procedure Laterality Date  . APPLICATION OF ROBOTIC ASSISTANCE FOR SPINAL PROCEDURE N/A 07/16/2019   Procedure: APPLICATION OF ROBOTIC ASSISTANCE FOR SPINAL PROCEDURE;  Surgeon: Kristeen Miss, MD;  Location: [facility];  Service: Neurosurgery;  Laterality: N/A;  . ESOPHAGOGASTRODUODENOSCOPY  08/13/2019   Antral gastritis; H pylori NEG  . LASIK Bilateral 2011  . POSTERIOR LUMBAR FUSION 4 LEVEL N/A 07/16/2019   Procedure: Thoracic Four to Lumbar Two  posterior fusion, Kyphectomy with osteotomies, segmental pedicle screw fixation;  Surgeon: Kristeen Miss, MD;  Location: [facility];  Service: Neurosurgery;  Laterality: N/A;  Thoracic Four to Lumbar Two posterior fusion, Kyphectomy with osteotomies, segmental pedicle screw fixation  . REFRACTIVE SURGERY Bilateral    Social History   Occupational History  . Occupation: computer work  Tobacco Use  . Smoking status: Never Smoker  . Smokeless tobacco: Never Used  Vaping Use  . Vaping Use: Never used  Substance and Sexual Activity  . Alcohol use: No  . Drug use: No  . Sexual activity: Not on file

## 2021-03-11 ENCOUNTER — Ambulatory Visit
Admission: RE | Admit: 2021-03-11 | Discharge: 2021-03-11 | Disposition: A | Discharge status: Home / Self Care | Payer: 59 | Source: Ambulatory Visit | Attending: Orthopedic Surgery | Admitting: Orthopedic Surgery

## 2021-03-11 ENCOUNTER — Other Ambulatory Visit: Payer: Self-pay

## 2021-03-11 DIAGNOSIS — M25511 Pain in right shoulder: Secondary | ICD-10-CM

## 2021-03-11 DIAGNOSIS — M25512 Pain in left shoulder: Secondary | ICD-10-CM

## 2021-03-11 MED ORDER — IOPAMIDOL (ISOVUE-M 200) INJECTION 41%
13.0000 mL | Freq: Once | INTRAMUSCULAR | Status: AC
  Administered 2021-03-11: 13 mL via INTRA_ARTICULAR

## 2021-03-11 MED ORDER — IOPAMIDOL (ISOVUE-M 200) INJECTION 41%
15.0000 mL | Freq: Once | INTRAMUSCULAR | Status: AC
  Administered 2021-03-11: 15 mL via INTRA_ARTICULAR

## 2021-03-20 ENCOUNTER — Other Ambulatory Visit: Payer: Self-pay

## 2021-03-20 ENCOUNTER — Ambulatory Visit: Payer: 59 | Admitting: Surgical

## 2021-03-20 ENCOUNTER — Encounter: Payer: Self-pay | Admitting: Surgical

## 2021-03-20 DIAGNOSIS — M542 Cervicalgia: Secondary | ICD-10-CM | POA: Diagnosis not present

## 2021-03-20 DIAGNOSIS — M67811 Other specified disorders of synovium, right shoulder: Secondary | ICD-10-CM

## 2021-03-20 DIAGNOSIS — M67812 Other specified disorders of synovium, left shoulder: Secondary | ICD-10-CM

## 2021-03-20 DIAGNOSIS — M67819 Other specified disorders of synovium and tendon, unspecified shoulder: Secondary | ICD-10-CM

## 2021-03-20 DIAGNOSIS — M25511 Pain in right shoulder: Secondary | ICD-10-CM | POA: Diagnosis not present

## 2021-03-20 DIAGNOSIS — G8929 Other chronic pain: Secondary | ICD-10-CM | POA: Diagnosis not present

## 2021-03-20 DIAGNOSIS — M25512 Pain in left shoulder: Secondary | ICD-10-CM

## 2021-03-20 MED ORDER — LIDOCAINE HCL 1 % IJ SOLN
5.0000 mL | INTRAMUSCULAR | Status: AC | PRN
  Administered 2021-03-20: 5 mL

## 2021-03-20 MED ORDER — BETAMETHASONE SOD PHOS & ACET 6 (3-3) MG/ML IJ SUSP
6.0000 mg | INTRAMUSCULAR | Status: AC | PRN
  Administered 2021-03-20: 6 mg via INTRA_ARTICULAR

## 2021-03-20 MED ORDER — BUPIVACAINE HCL 0.25 % IJ SOLN
4.0000 mL | INTRAMUSCULAR | Status: AC | PRN
  Administered 2021-03-20: 4 mL via INTRA_ARTICULAR

## 2021-03-20 NOTE — Progress Notes (Signed)
Office Visit Note   Patient: Bernard Dalton           Date of Birth: November 25, 1981           MRN: [id number] Visit Date: 03/20/2021 Requested by: Tammi Sou, MD [address] [place],  [place] [address] PCP: Tammi Sou, MD  Subjective: Chief Complaint  Patient presents with  . Left Shoulder - Follow-up, Pain  . Right Shoulder - Pain    HPI: Bernard Dalton is a 40 y.o. male who presents to the office complaining of bilateral shoulder pain.  Patient returns to review MRIs of both shoulders.  He reports pain is the same with no change since last visit with Dr. Marlou Sa.  He has increased pain with lifting with the arms.  Both arms bother him equally.  He does note slightly decreased range of motion in the left shoulder.  No history of shoulder surgery.  No history of direct fall or dislocation of the shoulder.  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  He had severe pain the following day and was unable to lift his arms.  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  No history of diabetes or thyroid disorder..                ROS: All systems reviewed are negative as they relate to the chief complaint within the history of present illness.  Patient denies fevers or chills.  Assessment & Plan: Visit Diagnoses:  1. Chronic pain of both shoulders   2. Neck pain     Plan: Patient is a 40 year old male who presents complaint of bilateral shoulder pain.  He has had shoulder pain for about 5 years since planting fence posts 1 day.  He is here to review MRI of bilateral shoulders.  Left shoulder MRI revealed mild tendinopathy of the supraspinatus with marrow edema at the greater tuberosity without any labral tear.  Right shoulder MRI revealed similar marrow edema of the greater tuberosity with a partial-thickness tear of the anterior aspect of the supraspinatus without significant retraction.  No labral damage in the right shoulder  either.  Discussed options available to patient.  After discussion, plan to try bilateral shoulder injections into the subacromial space today.  Also ordered vitamin D, calcium, phosphorus, TSH.  Plan for patient to follow-up in 6 weeks for clinical recheck with Dr. Marlou Sa.  He did note significant improvement of his pain upon administration of the lidocaine injections.  Follow-Up Instructions: No follow-ups on file.   Orders:  Orders Placed This Encounter  Procedures  . Vitamin D (25 hydroxy)  . Calcium  . Phosphorus  . TSH   No orders of the defined types were placed in this encounter.     Procedures: Large Joint Inj: bilateral subacromial bursa on 03/20/2021 2:01 PM Indications: diagnostic evaluation and pain Details: 18 G 1.5 in needle, posterior approach  Arthrogram: No  Medications (Right): 5 mL lidocaine 1 %; 4 mL bupivacaine 0.25 %; 6 mg betamethasone acetate-betamethasone sodium phosphate 6 (3-3) MG/ML Medications (Left): 5 mL lidocaine 1 %; 4 mL bupivacaine 0.25 %; 6 mg betamethasone acetate-betamethasone sodium phosphate 6 (3-3) MG/ML Outcome: tolerated well, no immediate complications Procedure, treatment alternatives, risks and benefits explained, specific risks discussed. Consent was given by the patient. Immediately prior to procedure a time out was called to verify the correct patient, procedure, equipment, support staff and site/side  marked as required. Patient was prepped and draped in the usual sterile fashion.       Clinical Data: No additional findings.  Objective: Vital Signs: There were no vitals taken for this visit.  Physical Exam:  Constitutional: Patient appears well-developed HEENT:  Head: Normocephalic Eyes:EOM are normal Neck: Normal range of motion Cardiovascular: Normal rate Pulmonary/chest: Effort normal Neurologic: Patient is alert Skin: Skin is warm Psychiatric: Patient has normal mood and affect  Ortho Exam: Ortho exam demonstrates  bilateral shoulders with 50 degrees external rotation, 100 degrees abduction, 160 degrees forward flexion.  No weakness with rotator cuff strength testing though he does have increased pain particularly with infraspinatus and supraspinatus strength testing.  Specialty Comments:  No specialty comments available.  Imaging: No results found.   PMFS History: Patient Active Problem List   Diagnosis Date Noted  . Allergic rhinitis due to animal (cat) (dog) hair and dander 01/02/2021  . Myofascial pain 12/30/2020  . Chronic migraine without aura without status migrainosus, not intractable 12/30/2020  . 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 Muscle spasm 05/25/2019  . Paresthesia 05/25/2019  . 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 Cervical spondylosis 01/10/2019  . Neck pain 05/25/2018  . Occipital neuralgia 05/25/2018  . New persistent daily headache 04/13/2018  . Vision disturbance 04/13/2018  . Papilledema 04/13/2018  . Kyphosis deformity of spine 01/03/2018  . Thoracic back pain 01/03/2018  . Adult ADHD 02/20/2016  . Anxiety and depression 02/20/2016  . Interstitial cystitis 08/27/2014   Past Medical History:  Diagnosis Date  . ADD (attention deficit disorder)    Started seeing Dr. Johnnye Sima at [facility] 07/2019->adderall changed to vyvanse, stable on 52m qd as of 12/2019  . Allergic rhinitis    + allerg conjunctivitis.  Immunotherapy started 2019  . Anxiety and depression   . Blood transfusion without reported diagnosis 07/16/2019  . Chronic prostatitis    chronic irritative voiding symptoms: pelvic PT via [facility] helping as of 07/2016  . Concussion   . DDD (degenerative disc disease), cervical 08/2018   (Dr. SMaia Petties:  C5-C6 --ESI 25% improvement in fall 2019.  Pt desiring C spine surgery to see if this alleviates his symptom complex..  . Diverticulosis    with 'itis per pt report; however, review of old record CT reports shows mild SB  enteritis with mesenteric adenopathy 06/2015; then 11/2014 CT abd/pelv done for urinary frequency and hematuria showed NORMAL abd/pelv  . Elevation of optic disc, bilateral 2019   Normal variant per ophtho--Dr. HLarose Kells  .Marland KitchenEpigastric burning sensation 2020   EGD 08/13/19->gastritis. Continue daily PPI + H2 blocker  . 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 papilledema on optho exam.  Dr. SFelecia Shellingwith neuro saw him 04/2018, no papilledema noted and opening CSF pressure was nl.  CSF labs nl.  ESR and ANA normal.  No clear reason for HA's found.  Imipr trial no help.  Trig pt inj's in occipi mm's 05/2018 by Dr. SFelecia Shelling no help.Topamax trial 02/2019.  .Marland KitchenHeadache syndrome 02/2018   As of 07/2018, pt has plan to see Dr. CPatrice Paradise NS/spine specialist as next step (his HAs are likely cervicogenic).  Dr. SMaia Pettiesdid C7-T1 ESI on 09/11/18--25% improvement.   . IBS (irritable bowel syndrome)    diarr predom as per old PCP records  . Nephrolithiasis    Nonobstructive, noted on CT (1-2 mm)-has never passed a stone that he knows of.  .Marland Kitchen  Panic   . 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 Seasonal allergies   . Spondylosis without myelopathy or radiculopathy, thoracic region 2019  . Vision abnormalities   . Vitamin B12 deficiency 05/2019   intrinsic factor ab NEG. B12 normalized with oral B12    Family History  Problem Relation Age of Onset  . Alcohol abuse Mother   . Alcohol abuse Father   . Kidney disease Father   . Alcohol abuse Maternal Grandmother   . Alcohol abuse Maternal Grandfather   . Stroke Paternal Grandmother   . Congestive Heart Failure Paternal Grandmother   . Alcohol abuse Paternal Grandmother   . Alcohol abuse Paternal Grandfather   . ADD / ADHD Brother   . ADD / ADHD Brother   . Stomach cancer Neg Hx   . Colon cancer Neg Hx   . Esophageal cancer Neg Hx   . Rectal  cancer Neg Hx     Past Surgical History:  Procedure Laterality Date  . APPLICATION OF ROBOTIC ASSISTANCE FOR SPINAL PROCEDURE N/A 07/16/2019   Procedure: APPLICATION OF ROBOTIC ASSISTANCE FOR SPINAL PROCEDURE;  Surgeon: Kristeen Miss, MD;  Location: [facility];  Service: Neurosurgery;  Laterality: N/A;  . ESOPHAGOGASTRODUODENOSCOPY  08/13/2019   Antral gastritis; H pylori NEG  . LASIK Bilateral 2011  . POSTERIOR LUMBAR FUSION 4 LEVEL N/A 07/16/2019   Procedure: Thoracic Four to Lumbar Two posterior fusion, Kyphectomy with osteotomies, segmental pedicle screw fixation;  Surgeon: Kristeen Miss, MD;  Location: [facility];  Service: Neurosurgery;  Laterality: N/A;  Thoracic Four to Lumbar Two posterior fusion, Kyphectomy with osteotomies, segmental pedicle screw fixation  . REFRACTIVE SURGERY Bilateral    Social History   Occupational History  . Occupation: computer work  Tobacco Use  . Smoking status: Never Smoker  . Smokeless tobacco: Never Used  Vaping Use  . Vaping Use: Never used  Substance and Sexual Activity  . Alcohol use: No  . Drug use: No  . Sexual activity: Not on file

## 2021-03-21 ENCOUNTER — Other Ambulatory Visit: Payer: Self-pay | Admitting: Surgical

## 2021-03-21 LAB — PHOSPHORUS: Phosphorus: 3.8 mg/dL (ref 2.5–4.5)

## 2021-03-21 LAB — CALCIUM: Calcium: 10.2 mg/dL (ref 8.6–10.3)

## 2021-03-21 LAB — VITAMIN D 25 HYDROXY (VIT D DEFICIENCY, FRACTURES): Vit D, 25-Hydroxy: 27 ng/mL — ABNORMAL LOW (ref 30–100)

## 2021-03-21 LAB — TSH: TSH: 1.43 mIU/L (ref 0.40–4.50)

## 2021-03-21 LAB — EXTRA LAV TOP TUBE

## 2021-03-21 MED ORDER — VITAMIN D (ERGOCALCIFEROL) 1.25 MG (50000 UNIT) PO CAPS: 50000.0000 [IU] | ORAL_CAPSULE | ORAL | 0 refills | Status: DC

## 2021-05-04 ENCOUNTER — Encounter: Payer: Self-pay | Admitting: Orthopedic Surgery

## 2021-05-04 ENCOUNTER — Ambulatory Visit: Payer: 59 | Admitting: Orthopedic Surgery

## 2021-05-04 VITALS — Ht 72.0 in | Wt 175.0 lb

## 2021-05-04 DIAGNOSIS — M25511 Pain in right shoulder: Secondary | ICD-10-CM | POA: Diagnosis not present

## 2021-05-04 DIAGNOSIS — M25512 Pain in left shoulder: Secondary | ICD-10-CM | POA: Diagnosis not present

## 2021-05-11 ENCOUNTER — Encounter: Payer: Self-pay | Admitting: Orthopedic Surgery

## 2021-05-11 NOTE — Progress Notes (Signed)
Office Visit Note   Patient: Bernard Dalton           Date of Birth: 01-Jun-1981           MRN: [id number] Visit Date: 05/04/2021 Requested by: Tammi Sou, MD [address] [place],   [address] PCP: Tammi Sou, MD  Subjective: Chief Complaint  Patient presents with  . Right Shoulder - Pain, Follow-up  . Left Shoulder - Follow-up, Pain    HPI: Bernard Dalton is a 40 year old patient with bilateral shoulder pain.  Had bilateral shoulder injections for 822.  Does not feel like the injections helped.  Medications also not helping.  Hurts him to use the shoulders but not really reporting pain at rest.  Has had no real physical therapy.  EMG 3 weeks ago was normal.  His shoulder did feel good for several hours after the injection but hurt thereafter.  Hurts him to put his right hand behind his back.  Pain in the left shoulder equals pain in the right shoulder.  Describes less mobility on the left-hand side but more pain on the right-hand side.  He has had a lot of physical therapy.                ROS: All systems reviewed are negative as they relate to the chief complaint within the history of present illness.  Patient denies  fevers or chills.   Assessment & Plan: Visit Diagnoses: No diagnosis found.  Plan: Impression is bilateral shoulder pain.  No real indication for surgical intervention at this time.  Does have some marrow edema in the greater tuberosity which is nonspecific.  Whether or not any type of subacromial decompression is indicated is up for debate.  Essentially has findings similar in the right shoulder and left shoulder.  No evidence of frozen shoulder or definitively operative pathology.  Plan at this time is observation.  If symptoms change she should come back.  Could consider further injections as well.  Follow-Up Instructions: Return if symptoms worsen or fail to improve.   Orders:  No orders of the defined types were placed in this encounter.  No  orders of the defined types were placed in this encounter.     Procedures: No procedures performed   Clinical Data: No additional findings.  Objective: Vital Signs: Ht 6' (1.829 m)   Wt 175 lb (79.4 kg)   BMI 23.73 kg/m   Physical Exam:   Constitutional: Patient appears well-developed HEENT:  Head: Normocephalic Eyes:EOM are normal Neck: Normal range of motion Cardiovascular: Normal rate Pulmonary/chest: Effort normal Neurologic: Patient is alert Skin: Skin is warm Psychiatric: Patient has normal mood and affect  Ortho Exam: Ortho exam demonstrates good cervical spine range of motion.  No paresthesias C5-T1.  No masses lymphadenopathy or skin changes noted in the cervical regions.  No discrete AC joint tenderness.  Range of motion full.  Rotator cuff strength is good.  Does have some equivocal impingement signs bilaterally.  Not too much coarse grinding or crepitus with active or passive range of motion above head shoulder level of both shoulders.  Specialty Comments:  No specialty comments available.  Imaging: No results found.   PMFS History: Patient Active Problem List   Diagnosis Date Noted  . Allergic rhinitis due to animal (cat) (dog) hair and dander 01/02/2021  . Myofascial pain 12/30/2020  . Chronic migraine without aura without status migrainosus, not intractable 12/30/2020  . 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  Muscle spasm 05/25/2019  . Paresthesia 05/25/2019  . 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 Cervical spondylosis 01/10/2019  . Neck pain 05/25/2018  . Occipital neuralgia 05/25/2018  . New persistent daily headache 04/13/2018  . Vision disturbance 04/13/2018  . Papilledema 04/13/2018  . Kyphosis deformity of spine 01/03/2018  . Thoracic back pain 01/03/2018  . Adult ADHD 02/20/2016  . Anxiety and depression 02/20/2016  . Interstitial cystitis 08/27/2014   Past Medical History:  Diagnosis Date  . ADD (attention deficit disorder)     Started seeing Dr. Johnnye Sima at [facility] 07/2019->adderall changed to vyvanse, stable on 59m qd as of 12/2019  . Allergic rhinitis    + allerg conjunctivitis.  Immunotherapy started 2019  . Anxiety and depression   . Blood transfusion without reported diagnosis 07/16/2019  . Chronic prostatitis    chronic irritative voiding symptoms: pelvic PT via [facility] helping as of 07/2016  . Concussion   . DDD (degenerative disc disease), cervical 08/2018   (Dr. SMaia Petties:  C5-C6 --ESI 25% improvement in fall 2019.  Pt desiring C spine surgery to see if this alleviates his symptom complex..  . Diverticulosis    with 'itis per pt report; however, review of old record CT reports shows mild SB enteritis with mesenteric adenopathy 06/2015; then 11/2014 CT abd/pelv done for urinary frequency and hematuria showed NORMAL abd/pelv  . Elevation of optic disc, bilateral 2019   Normal variant per ophtho--Dr. HLarose Kells  .Marland KitchenEpigastric burning sensation 2020   EGD 08/13/19->gastritis. Continue daily PPI + H2 blocker  . 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 papilledema on optho exam.  Dr. SFelecia Shellingwith neuro saw him 04/2018, no papilledema noted and opening CSF pressure was nl.  CSF labs nl.  ESR and ANA normal.  No clear reason for HA's found.  Imipr trial no help.  Trig pt inj's in occipi mm's 05/2018 by Dr. SFelecia Shelling no help.Topamax trial 02/2019.  .Marland KitchenHeadache syndrome 02/2018   As of 07/2018, pt has plan to see Dr. CPatrice Paradise NS/spine specialist as next step (his HAs are likely cervicogenic).  Dr. SMaia Pettiesdid C7-T1 ESI on 09/11/18--25% improvement.   . IBS (irritable bowel syndrome)    diarr predom as per old PCP records  . Nephrolithiasis    Nonobstructive, noted on CT (1-2 mm)-has never passed a stone that he knows of.  . Panic   . 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  Seasonal allergies   . Spondylosis without myelopathy or radiculopathy, thoracic region 2019  . Vision abnormalities   . Vitamin B12 deficiency 05/2019   intrinsic factor ab NEG. B12 normalized with oral B12    Family History  Problem Relation Age of Onset  . Alcohol abuse Mother   . Alcohol abuse Father   . Kidney disease Father   . Alcohol abuse Maternal Grandmother   . Alcohol abuse Maternal Grandfather   . Stroke Paternal Grandmother   . Congestive Heart Failure Paternal Grandmother   . Alcohol abuse Paternal Grandmother   . Alcohol abuse Paternal Grandfather   . ADD / ADHD Brother   . ADD / ADHD Brother   . Stomach cancer Neg Hx   . Colon cancer Neg Hx   . Esophageal cancer Neg Hx   . Rectal cancer Neg Hx     Past Surgical History:  Procedure Laterality Date  . APPLICATION OF ROBOTIC ASSISTANCE FOR SPINAL PROCEDURE  N/A 07/16/2019   Procedure: APPLICATION OF ROBOTIC ASSISTANCE FOR SPINAL PROCEDURE;  Surgeon: Kristeen Miss, MD;  Location: [facility];  Service: Neurosurgery;  Laterality: N/A;  . ESOPHAGOGASTRODUODENOSCOPY  08/13/2019   Antral gastritis; H pylori NEG  . LASIK Bilateral 2011  . POSTERIOR LUMBAR FUSION 4 LEVEL N/A 07/16/2019   Procedure: Thoracic Four to Lumbar Two posterior fusion, Kyphectomy with osteotomies, segmental pedicle screw fixation;  Surgeon: Kristeen Miss, MD;  Location: [facility];  Service: Neurosurgery;  Laterality: N/A;  Thoracic Four to Lumbar Two posterior fusion, Kyphectomy with osteotomies, segmental pedicle screw fixation  . REFRACTIVE SURGERY Bilateral    Social History   Occupational History  . Occupation: computer work  Tobacco Use  . Smoking status: Never Smoker  . Smokeless tobacco: Never Used  Vaping Use  . Vaping Use: Never used  Substance and Sexual Activity  . Alcohol use: No  . Drug use: No  . Sexual activity: Not on file

## 2021-09-09 ENCOUNTER — Other Ambulatory Visit: Payer: Self-pay

## 2021-09-10 ENCOUNTER — Other Ambulatory Visit: Payer: Self-pay

## 2021-09-10 ENCOUNTER — Ambulatory Visit (INDEPENDENT_AMBULATORY_CARE_PROVIDER_SITE_OTHER): Payer: 59 | Admitting: Family Medicine

## 2021-09-10 ENCOUNTER — Encounter: Payer: Self-pay | Admitting: Family Medicine

## 2021-09-10 VITALS — BP 119/73 | HR 88 | Temp 98.2°F | Ht 72.0 in | Wt 184.0 lb

## 2021-09-10 DIAGNOSIS — F339 Major depressive disorder, recurrent, unspecified: Secondary | ICD-10-CM

## 2021-09-10 DIAGNOSIS — Z23 Encounter for immunization: Secondary | ICD-10-CM

## 2021-09-10 DIAGNOSIS — F410 Panic disorder [episodic paroxysmal anxiety] without agoraphobia: Secondary | ICD-10-CM

## 2021-09-10 DIAGNOSIS — F411 Generalized anxiety disorder: Secondary | ICD-10-CM | POA: Diagnosis not present

## 2021-09-10 MED ORDER — PAROXETINE HCL 20 MG PO TABS: ORAL_TABLET | ORAL | 0 refills | Status: DC

## 2021-09-10 NOTE — Progress Notes (Signed)
OFFICE VISIT  09/10/2021  CC:  Chief Complaint  Patient presents with   Medication Management    Psychiatrist used to prescribe paxil, would like Dr.Lida Berkery to take over prescribing medication.    HPI:    Patient is a 40 y.o. Caucasian male who presents for "discuss medications". Long hx of anxiety/panic/depression.  Many many meds in the past, only thing that worked was paxil. Got off this b/c of excessive wt gain but d/t worse anxiety he elected to retry SSRI in May this year---currently has a long term psychiatrist and they tried fluoxetine but this caused excessive drowsiness so he cross titrated over to paxil and this has worked well.  He wants to stay on paxil at this dose (62m, has been on this dose for about 2 mo now) for at least another 6 mo.  However, he called psychiatrist's office for RF and they declined it b/c [place] and the fill-in psychiatrist didn't think paxil was the right med for him b/c wt gain.  She rx'd paxil at the 148mdosing. Pt asks me to rx the 2047maxil until he figures out whether he'll continue with his psychiatrist or not.   Cervical dystonia: getting botox injections, most recent was about 2 mo ago.    Past Medical History:  Diagnosis Date   ADD (attention deficit disorder)    Started seeing Dr. [place]e Sima [place]->adderall changed to vyvanse, stable on 56m67m as of 12/2019   Allergic rhinitis    + allerg conjunctivitis.  Immunotherapy started 2019   Anxiety and depression    Blood transfusion without reported diagnosis 07/16/2019   Chronic prostatitis    chronic irritative voiding symptoms: pelvic PT via [facility] helping as of 07/2016   Concussion    DDD (degenerative disc disease), cervical 08/2018   (Dr. SaulMaia PettiesC5-C6 --ESI 25% improvement in fall 2019.  Pt desiring C spine surgery to see if this alleviates his symptom complex..   Diverticulosis    with 'itis per pt report; however,  review of old record CT reports shows mild SB enteritis with mesenteric adenopathy 06/2015; then 11/2014 CT abd/pelv done for urinary frequency and hematuria showed NORMAL abd/pelv   Elevation of optic disc, bilateral 2019   Normal variant per ophtho--Dr. HageLarose KellsEpigastric burning sensation 2020   EGD 08/13/19->gastritis. 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 papilledema on optho exam.  Dr. SateFelecia Shellingh neuro saw him 04/2018, no papilledema noted and opening CSF pressure was nl.  CSF labs nl.  ESR and ANA normal.  No clear reason for HA's found.  Imipr trial no help.  Trig pt inj's in occipi mm's 05/2018 by Dr. SateFelecia Shelling help.Topamax trial 02/2019.   Headache syndrome 02/2018   As of 07/2018, pt has plan to see Dr. CohePatrice Paradise/spine specialist as next step (his HAs are likely cervicogenic).  Dr. SaulMaia Petties C7-T1 ESI on 09/11/18--25% improvement.    IBS (irritable bowel syndrome)    diarr predom as per old PCP records   Nephrolithiasis    Nonobstructive, noted on CT (1-2 mm)-has never passed a stone that he knows of.   Panic    Scheuermann's kyphosis    juvenile kyphosis--decompression surgery 10/2019   Seasonal allergies    Spondylosis without myelopathy or radiculopathy, thoracic region 2019   Vision abnormalities    Vitamin  B12 deficiency 05/2019   intrinsic factor ab NEG. B12 normalized with oral B12    Past Surgical History:  Procedure Laterality Date   APPLICATION OF ROBOTIC ASSISTANCE FOR SPINAL PROCEDURE N/A 07/16/2019   Procedure: APPLICATION OF ROBOTIC ASSISTANCE FOR SPINAL PROCEDURE;  Surgeon: Kristeen Miss, MD;  Location: [facility];  Service: Neurosurgery;  Laterality: N/A;   ESOPHAGOGASTRODUODENOSCOPY  08/13/2019   Antral gastritis; H pylori NEG   LASIK Bilateral 2011   POSTERIOR LUMBAR FUSION 4 LEVEL N/A 07/16/2019   Procedure: Thoracic Four to Lumbar Two  posterior fusion, Kyphectomy with osteotomies, segmental pedicle screw fixation;  Surgeon: Kristeen Miss, MD;  Location: [facility];  Service: Neurosurgery;  Laterality: N/A;  Thoracic Four to Lumbar Two posterior fusion, Kyphectomy with osteotomies, segmental pedicle screw fixation   REFRACTIVE SURGERY Bilateral     Outpatient Medications Prior to Visit  Medication Sig Dispense Refill   celecoxib (CELEBREX) 100 MG capsule Take 100 mg by mouth 2 (two) times daily.     fexofenadine (ALLEGRA) 180 MG tablet Take 180 mg by mouth daily.     gabapentin (NEURONTIN) 300 MG capsule Take one po qAM, one po qPM and teo po qHS (Patient taking differently: Take 300 mg by mouth 4 (four) times daily.) 120 capsule 11   methocarbamol (ROBAXIN) 500 MG tablet Take 1 tablet by mouth every 6 (six) hours.     VYVANSE 20 MG capsule Take 1 capsule by mouth every morning.     PARoxetine (PAXIL) 20 MG tablet Take by mouth.     No facility-administered medications prior to visit.    No Known Allergies  ROS As per HPI  PE: Vitals with BMI 09/10/2021 05/04/2021 01/02/2021  Height 6' 0"  6' 0"  5' 9"   Weight 184 lbs 175 lbs 174 lbs  BMI 24.95 38.88 28.00  Systolic 349 - 179  Diastolic 73 - 78  Pulse 88 - 63    Wt Readings from Last 2 Encounters:  09/10/21 184 lb (83.5 kg)  05/04/21 175 lb (79.4 kg)    Gen: alert, oriented x 4, affect pleasant.  Lucid thinking and conversation noted. HEENT: PERRLA, EOMI.   Neck: no LAD, mass, or thyromegaly. CV: RRR, no m/r/g LUNGS: CTA bilat, nonlabored. NEURO: no tremor or tics noted on observation.  Coordination intact. CN 2-12 grossly intact bilaterally, strength 5/5 in all extremeties.  No ataxia.  LABS:  none  IMPRESSION AND PLAN:  Recurrent MDD, GAD, panic. Doing well on paxil 20 qd.  Has wt gain on this med but benefits outweigh this prob at this time. I rx'd 90d supply of paxil 58m---1 qd.   He'll decide in the next 90d who he wants to get f/u care with.  An  After Visit Summary was printed and given to the patient.  FOLLOW UP: Return for 3 mo f/u if he does not end up continuing care with his psychiatrist.  Signed:  PCrissie Sickles MD           09/10/2021

## 2021-11-09 ENCOUNTER — Encounter: Payer: Self-pay | Admitting: Family Medicine

## 2021-11-09 ENCOUNTER — Telehealth: Payer: Self-pay

## 2021-11-09 NOTE — Telephone Encounter (Signed)
Please advise if pt qualifies for jury duty letter.

## 2021-11-09 NOTE — Telephone Encounter (Signed)
Called and lvm for patient to pick up letter from front office. Sw, cma

## 2021-11-09 NOTE — Telephone Encounter (Signed)
Letter printed and placed on Bernard Dalton's keyboard.

## 2021-11-10 NOTE — Telephone Encounter (Signed)
Letter has been e-mailed or given to pt.

## 2021-11-22 ENCOUNTER — Other Ambulatory Visit: Payer: Self-pay | Admitting: Family Medicine

## 2021-11-23 NOTE — Telephone Encounter (Signed)
Spoke with pt and he will not be f/u with psychiatrist any longer. He request refill and will f/u with PCP in the next few months. Advised will do 90 d supply and if he could schedule f/u prior to current refill running out. Pt voiced understanding

## 2022-02-04 ENCOUNTER — Other Ambulatory Visit: Payer: Self-pay | Admitting: Family Medicine

## 2022-03-02 ENCOUNTER — Other Ambulatory Visit: Payer: Self-pay

## 2022-03-02 ENCOUNTER — Encounter: Payer: Self-pay | Admitting: Family Medicine

## 2022-03-02 ENCOUNTER — Ambulatory Visit (INDEPENDENT_AMBULATORY_CARE_PROVIDER_SITE_OTHER): Payer: 59 | Admitting: Family Medicine

## 2022-03-02 VITALS — BP 124/79 | HR 68 | Temp 97.9°F | Ht 72.25 in | Wt 204.4 lb

## 2022-03-02 DIAGNOSIS — F411 Generalized anxiety disorder: Secondary | ICD-10-CM | POA: Diagnosis not present

## 2022-03-02 DIAGNOSIS — Z1159 Encounter for screening for other viral diseases: Secondary | ICD-10-CM

## 2022-03-02 DIAGNOSIS — Z0000 Encounter for general adult medical examination without abnormal findings: Secondary | ICD-10-CM

## 2022-03-02 DIAGNOSIS — F339 Major depressive disorder, recurrent, unspecified: Secondary | ICD-10-CM

## 2022-03-02 DIAGNOSIS — M5412 Radiculopathy, cervical region: Secondary | ICD-10-CM

## 2022-03-02 DIAGNOSIS — T887XXA Unspecified adverse effect of drug or medicament, initial encounter: Secondary | ICD-10-CM

## 2022-03-02 LAB — CBC WITH DIFFERENTIAL/PLATELET
Basophils Absolute: 0 10*3/uL (ref 0.0–0.1)
Basophils Relative: 0.5 % (ref 0.0–3.0)
Eosinophils Absolute: 0 10*3/uL (ref 0.0–0.7)
Eosinophils Relative: 0.9 % (ref 0.0–5.0)
HCT: 44.3 % (ref 39.0–52.0)
Hemoglobin: 15.2 g/dL (ref 13.0–17.0)
Lymphocytes Relative: 25.1 % (ref 12.0–46.0)
Lymphs Abs: 1.3 10*3/uL (ref 0.7–4.0)
MCHC: 34.2 g/dL (ref 30.0–36.0)
MCV: 91.2 fl (ref 78.0–100.0)
Monocytes Absolute: 0.4 10*3/uL (ref 0.1–1.0)
Monocytes Relative: 7.3 % (ref 3.0–12.0)
Neutro Abs: 3.3 10*3/uL (ref 1.4–7.7)
Neutrophils Relative %: 66.2 % (ref 43.0–77.0)
Platelets: 181 10*3/uL (ref 150.0–400.0)
RBC: 4.85 Mil/uL (ref 4.22–5.81)
RDW: 13.9 % (ref 11.5–15.5)
WBC: 5.1 10*3/uL (ref 4.0–10.5)

## 2022-03-02 LAB — LIPID PANEL
Cholesterol: 190 mg/dL (ref 0–200)
HDL: 60.4 mg/dL (ref >39.00)
LDL Cholesterol: 115 mg/dL — ABNORMAL HIGH (ref 0–99)
NonHDL: 130.03
Total CHOL/HDL Ratio: 3
Triglycerides: 74 mg/dL (ref 0.0–149.0)
VLDL: 14.8 mg/dL (ref 0.0–40.0)

## 2022-03-02 LAB — COMPREHENSIVE METABOLIC PANEL
ALT: 18 U/L (ref 0–53)
AST: 14 U/L (ref 0–37)
Albumin: 4.5 g/dL (ref 3.5–5.2)
Alkaline Phosphatase: 71 U/L (ref 39–117)
BUN: 11 mg/dL (ref 6–23)
CO2: 32 mEq/L (ref 19–32)
Calcium: 9.6 mg/dL (ref 8.4–10.5)
Chloride: 103 mEq/L (ref 96–112)
Creatinine, Ser: 0.88 mg/dL (ref 0.40–1.50)
GFR: 107.31 mL/min (ref >60.00)
Glucose, Bld: 92 mg/dL (ref 70–99)
Potassium: 4.1 mEq/L (ref 3.5–5.1)
Sodium: 142 mEq/L (ref 135–145)
Total Bilirubin: 0.9 mg/dL (ref 0.2–1.2)
Total Protein: 6.6 g/dL (ref 6.0–8.3)

## 2022-03-02 LAB — TSH: TSH: 1.55 u[IU]/mL (ref 0.35–5.50)

## 2022-03-02 MED ORDER — PAROXETINE HCL 20 MG PO TABS: 20.0000 mg | ORAL_TABLET | Freq: Every day | ORAL | 1 refills | Status: DC

## 2022-03-02 NOTE — Patient Instructions (Signed)

## 2022-03-02 NOTE — Progress Notes (Signed)
Office Note ?03/02/2022 ? ?CC:  ?Chief Complaint  ?Patient presents with  ? Annual Exam  ? ?Patient is a 41 y.o. male who is here for annual health maintenance exam and follow-up anxiety and depression. ?I last saw him about 6 months ago. ?A/P as of that visit: ?"Recurrent MDD, GAD, panic. ?Doing well on paxil 20 qd.  Has wt gain on this med but benefits outweigh this prob at this time. ?I rx'd 90d supply of paxil 41m---1 qd.   ?He'll decide in the next 90d who he wants to get f/u care with" ? ?INTERIM HX: ?Doing well from an anxiety and mood standpoint on Paxil 20 mg a day. ?Continues to be frustrated with his weight gain that he associates with this medication but still feels like benefits outweigh the risks for him.  He no longer sees his psychiatrist, this became too much of a hassle and burdensome. ?He is open to referral to new psychiatrist. ? ?Lately has been having worsening pain in the right side of his neck when he bends it to the right or flexes the neck forward.  The pain radiates down the right shoulder and arm into the index and middle finger, numbness feeling in index and middle finger on the volar surface lately.  No arm or hand weakness. ?No preceding neck injury. ?He has chronic trapezius/cervical myofascial pain/cervicogenic headaches. ?Gets Botox injections in the trap areas bilaterally. ?Has been getting Celebrex and taking 200 mg at least once a day sometimes twice a day.  Also gabapentin 300 mg 4 times daily. ?PMP AWARE reviewed today: most recent rx for vyvanse was filled 02/06/22, # [id number] [facility] ?No red flags.   ? ?Past Medical History:  ?Diagnosis Date  ? ADD (attention deficit disorder)   ? Started seeing Dr. [place]nye [place]at [facility]->adderall changed to vyvanse, stable on 251mqd as of 12/2019  ? Allergic rhinitis   ? + allerg conjunctivitis.  Immunotherapy started 2019  ? Anxiety and depression   ? Blood transfusion without  reported diagnosis 07/16/2019  ? Chronic prostatitis   ? chronic irritative voiding symptoms: pelvic PT via [facility] helping as of 07/2016  ? Concussion   ? DDD (degenerative disc disease), cervical 08/2018  ? (Dr. SaMaia Petties  C5-C6 --ESI 25% improvement in fall 2019.  Pt desiring C spine surgery to see if this alleviates his symptom complex..  ? Diverticulosis   ? with 'itis per pt report; however, review of old record CT reports shows mild SB enteritis with mesenteric adenopathy 06/2015; then 11/2014 CT abd/pelv done for urinary frequency and hematuria showed NORMAL abd/pelv  ? Elevation of optic disc, bilateral 2019  ? Normal variant per ophtho--Dr. HaLarose Kells ? Epigastric burning sensation 2020  ? EGD 08/13/19->gastritis. Continue daily PPI + H2 blocker  ? Headache syndrome 02/2018  ? 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 papilledema on optho exam.  Dr. SaFelecia Shellingith neuro saw him 04/2018, no papilledema noted and opening CSF pressure was nl.  CSF labs nl.  ESR and ANA normal.  No clear reason for HA's found.  Imipr trial no help.  Trig pt inj's in occipi mm's 05/2018 by Dr. SaFelecia Shellingno help.Topamax trial 02/2019.  ? Headache syndrome 02/2018  ? As of 07/2018, pt has plan to see Dr. CoPatrice ParadiseNS/spine specialist as next step (his HAs are likely cervicogenic).  Dr. SaMaia Pettiesid C7-T1 ESI on 09/11/18--25%  improvement.   ? IBS (irritable bowel syndrome)   ? diarr predom as per old PCP records  ? Nephrolithiasis   ? Nonobstructive, noted on CT (1-2 mm)-has never passed a stone that he knows of.  ? Panic   ? Scheuermann's kyphosis   ? 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 Seasonal allergies   ? Spondylosis without myelopathy or radiculopathy, thoracic region 2019  ? Vision abnormalities   ? Vitamin B12 deficiency 05/2019  ? intrinsic factor ab NEG. B12 normalized with oral B12  ? ? ?Past Surgical History:  ?Procedure Laterality Date  ? APPLICATION OF  ROBOTIC ASSISTANCE FOR SPINAL PROCEDURE N/A 07/16/2019  ? Procedure: APPLICATION OF ROBOTIC ASSISTANCE FOR SPINAL PROCEDURE;  Surgeon: Kristeen Miss, MD;  Location: [place];  Service: Neurosurgery;  Laterality: N/A;  ? ESOPHAGOGASTRODUODENOSCOPY  08/13/2019  ? Antral gastritis; H pylori NEG  ? LASIK Bilateral 2011  ? POSTERIOR LUMBAR FUSION 4 LEVEL N/A 07/16/2019  ? Procedure: Thoracic Four to Lumbar Two posterior fusion, Kyphectomy with osteotomies, segmental pedicle screw fixation;  Surgeon: Kristeen Miss, MD;  Location: [place];  Service: Neurosurgery;  Laterality: N/A;  Thoracic Four to Lumbar Two posterior fusion, Kyphectomy with osteotomies, segmental pedicle screw fixation  ? REFRACTIVE SURGERY Bilateral   ? ? ?Family History  ?Problem Relation Age of Onset  ? Alcohol abuse Mother   ? Alcohol abuse Father   ? Kidney disease Father   ? Alcohol abuse Maternal Grandmother   ? Alcohol abuse Maternal Grandfather   ? Stroke Paternal Grandmother   ? Congestive Heart Failure Paternal Grandmother   ? Alcohol abuse Paternal Grandmother   ? Alcohol abuse Paternal Grandfather   ? ADD / ADHD Brother   ? ADD / ADHD Brother   ? Stomach cancer Neg Hx   ? Colon cancer Neg Hx   ? Esophageal cancer Neg Hx   ? Rectal cancer Neg Hx   ? ? ?Social History  ? ?Socioeconomic History  ? Marital status: Married  ?  Spouse name: Not on file  ? Number of children: 2  ? Years of education: Not on file  ? Highest education level: Not on file  ?Occupational History  ? Occupation: computer work  ?Tobacco Use  ? Smoking status: Never  ? Smokeless tobacco: Never  ?Vaping Use  ? Vaping Use: Never used  ?Substance and Sexual Activity  ? Alcohol use: No  ? Drug use: No  ? Sexual activity: Not on file  ?Other Topics Concern  ? Not on file  ?Social History Narrative  ? Married, 1 child and 1 on the way.  ? Relocated from [facility] to [facility] 08/2015.  ? Educ: college at [employer].  ? 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  ? No T/A/Ds.  ? ?Social  Determinants of Health  ? ?Financial Resource Strain: Not on file  ?Food Insecurity: Not on file  ?Transportation Needs: Not on file  ?Physical Activity: Not on file  ?Stress: Not on file  ?Social Connections: Not on file  ?Intimate Partner Violence: Not on file  ? ? ?Outpatient Medications Prior to Visit  ?Medication Sig Dispense Refill  ? botulinum toxin Type A (BOTOX) 100 units SOLR injection INJECT 100 UNITS  INTRAMUSCULARLY EVERY 3  MONTHS    ? celecoxib (CELEBREX) 100 MG capsule Take 100 mg by mouth 2 (two) times daily.    ? fexofenadine (ALLEGRA) 180 MG tablet Take 180 mg by mouth daily.    ? gabapentin (NEURONTIN)  300 MG capsule Take one po qAM, one po qPM and teo po qHS (Patient taking differently: Take 300 mg by mouth 4 (four) times daily.) 120 capsule 11  ? methocarbamol (ROBAXIN) 500 MG tablet Take 1 tablet by mouth every 6 (six) hours.    ? VYVANSE 20 MG capsule Take 1 capsule by mouth every morning.    ? PARoxetine (PAXIL) 20 MG tablet TAKE 1 TABLET BY MOUTH EVERY DAY 30 tablet 0  ? ?No facility-administered medications prior to visit.  ? ? ?No Known Allergies ? ?ROS ?Review of Systems  ?Constitutional:  Negative for appetite change, chills, fatigue and fever.  ?HENT:  Negative for congestion, dental problem, ear pain and sore throat.   ?Eyes:  Negative for discharge, redness and visual disturbance.  ?Respiratory:  Negative for cough, chest tightness, shortness of breath and wheezing.   ?Cardiovascular:  Negative for chest pain, palpitations and leg swelling.  ?Gastrointestinal:  Negative for abdominal pain, blood in stool, diarrhea, nausea and vomiting.  ?Genitourinary:  Negative for difficulty urinating, dysuria, flank pain, frequency, hematuria and urgency.  ?Musculoskeletal:  Positive for neck pain and neck stiffness. Negative for arthralgias, back pain, joint swelling and myalgias.  ?Skin:  Negative for pallor and rash.  ?Neurological:  Negative for dizziness, speech difficulty, weakness and  headaches.  ?Hematological:  Negative for adenopathy. Does not bruise/bleed easily.  ?Psychiatric/Behavioral:  Negative for confusion and sleep disturbance. The patient is not nervous/anxious.   ? ?PE; ?Vitals wi

## 2022-03-03 LAB — HEPATITIS C ANTIBODY
Hepatitis C Ab: NONREACTIVE
SIGNAL TO CUT-OFF: 0.07 (ref <1.00)

## 2022-03-27 ENCOUNTER — Encounter: Payer: Self-pay | Admitting: Family Medicine

## 2022-03-29 MED ORDER — PAROXETINE HCL 30 MG PO TABS: 30.0000 mg | ORAL_TABLET | Freq: Every day | ORAL | 5 refills | Status: DC

## 2022-03-29 NOTE — Telephone Encounter (Signed)
Okay 30 mg Paxil prescribed. ?

## 2022-04-27 ENCOUNTER — Encounter: Payer: Self-pay | Admitting: Family Medicine

## 2022-04-28 MED ORDER — BUPROPION HCL ER (XL) 150 MG PO TB24: 150.0000 mg | ORAL_TABLET | Freq: Every day | ORAL | 0 refills | Status: DC

## 2022-04-28 NOTE — Telephone Encounter (Signed)
We can try it. ?Prescription sent for Wellbutrin XL 150, 1 daily, #30, no refill. ?Follow-up with me in 3 to 4 weeks. ?

## 2022-05-25 MED ORDER — BUPROPION HCL ER (XL) 150 MG PO TB24: 150.0000 mg | ORAL_TABLET | Freq: Every day | ORAL | 0 refills | Status: DC

## 2022-05-25 NOTE — Telephone Encounter (Signed)
Please advise if pt still needs to follow up

## 2022-05-25 NOTE — Telephone Encounter (Signed)
OK, I'll RF the '150mg'$  wellbutrin for 61moand have him make either a virtual or in person visit in 3-4 wks to discuss things and see if we need to increase dose.

## 2022-06-17 ENCOUNTER — Ambulatory Visit: Payer: 59 | Admitting: Family Medicine

## 2022-06-23 ENCOUNTER — Ambulatory Visit: Payer: 59 | Admitting: Family Medicine

## 2022-06-23 ENCOUNTER — Encounter: Payer: Self-pay | Admitting: Family Medicine

## 2022-06-23 VITALS — BP 125/79 | HR 82 | Temp 98.3°F | Ht 72.25 in | Wt 207.8 lb

## 2022-06-23 DIAGNOSIS — D2230 Melanocytic nevi of unspecified part of face: Secondary | ICD-10-CM | POA: Diagnosis not present

## 2022-06-23 MED ORDER — BUPROPION HCL ER (XL) 150 MG PO TB24: 150.0000 mg | ORAL_TABLET | Freq: Every day | ORAL | 1 refills | Status: DC

## 2022-06-23 MED ORDER — PAROXETINE HCL 30 MG PO TABS: 30.0000 mg | ORAL_TABLET | Freq: Every day | ORAL | 1 refills | Status: DC

## 2022-06-23 MED ORDER — PROPRANOLOL HCL 20 MG PO TABS: ORAL_TABLET | ORAL | 3 refills | Status: DC

## 2022-06-23 NOTE — Progress Notes (Signed)
OFFICE VISIT  06/23/2022  CC:  Chief Complaint  Patient presents with   Anxiety   Depression   Patient is a 41 y.o. male who presents for 73-monthfollow-up history of anxiety and depression as well as chronic myofascial neck pain and history of right-sided cervical radiculopathy. A/P as of last visit: "#1 GAD, history of panic attacks, history of major depressive disorder. Seems to be doing very well on 20 mg a day of Paxil.  However, weight gain has been significant on this medicine--wt is up 20 pounds from last visit 6 months ago. He wants to go ahead and stay on this medication and is interested in establishing with a new psychiatrist.  Referral to Dr. HBenson Norwayordered today.   #2 right-sided cervical radiculopathy. This is superimposed on chronic myofascial pain in the trapezius and cervical areas. Discussed physical therapy referral today and he wanted to hold off and call back to request this. In the meantime we will continue gabapentin 300 mg 4 times daily and Celebrex 200 mg daily to twice daily as needed.  He did not need refill of the gabapentin or Celebrex today but requested that I assume responsibility for prescribing these medications--he has been getting this via his neurosurgeon and finds it extremely difficult to get back into see him.   He will continue Botox injections regularly.   #3 Health maintenance exam: Reviewed age and gender appropriate health maintenance issues (prudent diet, regular exercise, health risks of tobacco and excessive alcohol, use of seatbelts, fire alarms in home, use of sunscreen).  Also reviewed age and gender appropriate health screening as well as vaccine recommendations. Vaccines: All up-to-date Labs: fasting HP labs ordered. Prostate ca screening: average risk patient= as per latest guidelines, start screening at 541yrs of age. Colon ca screening: average risk patient= as per latest guidelines, start screening at 455yrs of age.   #4 adult ADD.   [facility]  INTERIM HX: JRasaanis doing very well regarding his chronic anxiety and history of panic attacks.  He is also feeling like his mood is stable and his depression is in remission. No significant side effects from Wellbutrin XL 150 mg a day and Paxil 30 mg a day.  He does note that he has tried propranolol recently after seeing a telemedicine provider.  A friend had told him about this medication so he inquired and they did try it when he went on a plane flight recently and it worked very well--- 20 mg dose.  Of note, his neck pain and headaches have "all but gone away".  His last botulinum injection was February of this year.  He has an enlarging mole on the right preauricular area.  Past Medical History:  Diagnosis Date   ADD (attention deficit disorder)    Started seeing Dr. [place]nye [place]aat [facility]->adderall changed to vyvanse, stable on 219mqd as of 12/2019   Allergic rhinitis    + allerg conjunctivitis.  Immunotherapy started 2019   Anxiety and depression    Blood transfusion without reported diagnosis 07/16/2019   Chronic prostatitis    chronic irritative voiding symptoms: pelvic PT via [facility] helping as of 07/2016   Concussion    DDD (degenerative disc disease), cervical 08/2018   (Dr. SaMaia Petties  C5-C6 --ESI 25% improvement in fall 2019.  Pt desiring C spine surgery to see if this alleviates his symptom complex..   Diverticulosis    with 'itis per pt report; however,  review of old record CT reports shows mild SB enteritis with mesenteric adenopathy 06/2015; then 11/2014 CT abd/pelv done for urinary frequency and hematuria showed NORMAL abd/pelv   Elevation of optic disc, bilateral 2019   Normal variant per ophtho--Dr. Larose Kells.   Epigastric burning sensation 2020   EGD 08/13/19->gastritis. Continue daily PPI + H2 blocker   Headache syndrome 02/2018   Chron intract headaches of unknown etiology  (occipital and frontal/retro-orbita): 03/2018 MRI normal: ? papilledema on optho exam.  Dr. [name] with neuro saw him 04/2018, no papilledema noted and opening CSF pressure was nl.  CSF labs nl.  ESR and ANA normal.  No clear reason for HA's found.  Imipr trial no help.  Trig pt inj's in occipi mm's 05/2018 by Dr. [name]. no help.Topamax trial 02/2019.   Headache syndrome 02/2018   As of 07/2018, pt has plan to see Dr. [name], NS/spine specialist as next step (his HAs are likely cervicogenic).  Dr. Maia Petties did C7-T1 ESI on 09/11/18--25% improvement.    IBS (irritable bowel syndrome)    diarr predom as per old PCP records   Nephrolithiasis    Nonobstructive, noted on CT (1-2 mm)-has never passed a stone that he knows of.   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 B12 normalized with oral B12    Past Surgical History:  Procedure Laterality Date   APPLICATION OF ROBOTIC ASSISTANCE FOR SPINAL PROCEDURE N/A 07/16/2019   Procedure: APPLICATION OF ROBOTIC ASSISTANCE FOR SPINAL PROCEDURE;  Surgeon: Kristeen Miss, MD;  Location: [place];  Service: Neurosurgery;  Laterality: N/A;   ESOPHAGOGASTRODUODENOSCOPY  08/13/2019   Antral gastritis; H pylori NEG   LASIK Bilateral 2011   POSTERIOR LUMBAR FUSION 4 LEVEL N/A 07/16/2019   Procedure: Thoracic Four to Lumbar Two posterior fusion, Kyphectomy with osteotomies, segmental pedicle screw fixation;  Surgeon: Kristeen Miss, MD;  Location: [place];  Service: Neurosurgery;  Laterality: N/A;  Thoracic Four to Lumbar Two posterior fusion, Kyphectomy with osteotomies, segmental pedicle screw fixation   REFRACTIVE SURGERY Bilateral     Outpatient Medications Prior to Visit  Medication Sig Dispense Refill   fexofenadine (ALLEGRA) 180 MG tablet Take 180 mg by  mouth daily.     VYVANSE 20 MG capsule Take 1 capsule by mouth every morning.     PARoxetine (PAXIL) 30 MG tablet Take 1 tablet (30 mg total) by mouth daily. 30 tablet 5   methocarbamol (ROBAXIN) 500 MG tablet Take 1 tablet by mouth every 6 (six) hours. (Patient not taking: Reported on 06/23/2022)     botulinum toxin Type A (BOTOX) 100 units SOLR injection INJECT 100 UNITS  INTRAMUSCULARLY EVERY 3  MONTHS (Patient not taking: Reported on 06/23/2022)     buPROPion (WELLBUTRIN XL) 150 MG 24 hr tablet Take 1 tablet (150 mg total) by mouth daily. 30 tablet 0   celecoxib (CELEBREX) 100 MG capsule Take 100 mg by mouth 2 (two) times daily. (Patient not taking: Reported on 06/23/2022)     gabapentin (NEURONTIN) 300 MG capsule Take one po qAM, one po qPM and teo po qHS (Patient not taking: Reported on 06/23/2022) 120 capsule 11   No facility-administered medications prior to visit.    No Known Allergies  ROS As per HPI  PE:    06/23/2022  2:41 PM 03/02/2022    8:57 AM 09/10/2021    2:01 PM  Vitals with BMI  Height 6' 0.25" 6' 0.25" 6' 0"   Weight 207 lbs 13 oz 204 lbs 6 oz 184 lbs  BMI 27.99 98.72 15.87  Systolic 276 184 859  Diastolic 79 79 73  Pulse 82 68 88   Physical Exam  Gen: Alert, well appearing.  Patient is oriented to person, place, time, and situation.a AFFECT: pleasant, lucid thought and speech. Right preauricular area with approximately 1 cm round brown nevus.  Borders are not irregular.  Color is uniform.  LABS:  Last CBC Lab Results  Component Value Date   WBC 5.1 03/02/2022   HGB 15.2 03/02/2022   HCT 44.3 03/02/2022   MCV 91.2 03/02/2022   MCH 31.2 07/19/2019   RDW 13.9 03/02/2022   PLT 181.0 27/63/9432   Last metabolic panel Lab Results  Component Value Date   GLUCOSE 92 03/02/2022   NA 142 03/02/2022   K 4.1 03/02/2022   CL 103 03/02/2022   CO2 32 03/02/2022   BUN 11 03/02/2022   CREATININE 0.88 03/02/2022   GFRNONAA >60 07/19/2019   CALCIUM 9.6  03/02/2022   PHOS 3.8 03/20/2021   PROT 6.6 03/02/2022   ALBUMIN 4.5 03/02/2022   BILITOT 0.9 03/02/2022   ALKPHOS 71 03/02/2022   AST 14 03/02/2022   ALT 18 03/02/2022   ANIONGAP 5 07/19/2019   Last lipids Lab Results  Component Value Date   CHOL 190 03/02/2022   HDL 60.40 03/02/2022   LDLCALC 115 (H) 03/02/2022   TRIG 74.0 03/02/2022   CHOLHDL 3 03/02/2022   Last hemoglobin A1c Lab Results  Component Value Date   HGBA1C 4.7 07/29/2016   Last thyroid functions Lab Results  Component Value Date   TSH 1.55 03/02/2022   T3TOTAL 121.0 07/29/2016   IMPRESSION AND PLAN:  #1 generalized anxiety with history of panic attacks. History of depression, currently in remission. Doing well on Paxil 30 mg a day as well as Wellbutrin XL 150 mg a day. 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 Propranolol 20 mg, 1-2 every 6 hours as needed, #30, refill x3.  #2 mole on face, enlarging. Referred to dermatology.  #3 chronic neck pain with secondary headaches. Doing very well.  He has not needed a botulinum injection in 5 months. He has methocarbamol to use as needed.  He stopped gabapentin because it never helped him.  An After Visit Summary was printed and given to the patient.  FOLLOW UP: Return in about 6 months (around 12/24/2022) for routine chronic illness f/u.  Signed:  Crissie Sickles, MD           06/23/2022

## 2022-09-02 ENCOUNTER — Ambulatory Visit: Payer: 59 | Admitting: Family Medicine

## 2022-10-13 DIAGNOSIS — Z79899 Other long term (current) drug therapy: Secondary | ICD-10-CM | POA: Diagnosis not present

## 2022-10-13 DIAGNOSIS — F902 Attention-deficit hyperactivity disorder, combined type: Secondary | ICD-10-CM | POA: Diagnosis not present

## 2022-10-19 DIAGNOSIS — L821 Other seborrheic keratosis: Secondary | ICD-10-CM | POA: Diagnosis not present

## 2022-10-19 DIAGNOSIS — D2239 Melanocytic nevi of other parts of face: Secondary | ICD-10-CM | POA: Diagnosis not present

## 2022-11-18 DIAGNOSIS — Z789 Other specified health status: Secondary | ICD-10-CM | POA: Diagnosis not present

## 2022-11-18 DIAGNOSIS — L820 Inflamed seborrheic keratosis: Secondary | ICD-10-CM | POA: Diagnosis not present

## 2022-11-18 DIAGNOSIS — R208 Other disturbances of skin sensation: Secondary | ICD-10-CM | POA: Diagnosis not present

## 2022-11-18 DIAGNOSIS — D2239 Melanocytic nevi of other parts of face: Secondary | ICD-10-CM | POA: Diagnosis not present

## 2022-11-18 DIAGNOSIS — L538 Other specified erythematous conditions: Secondary | ICD-10-CM | POA: Diagnosis not present

## 2022-11-18 DIAGNOSIS — L298 Other pruritus: Secondary | ICD-10-CM | POA: Diagnosis not present

## 2022-12-03 ENCOUNTER — Other Ambulatory Visit: Payer: Self-pay | Admitting: Family Medicine

## 2022-12-08 ENCOUNTER — Other Ambulatory Visit: Payer: Self-pay | Admitting: Family Medicine

## 2022-12-24 ENCOUNTER — Encounter: Payer: Self-pay | Admitting: Family Medicine

## 2022-12-24 ENCOUNTER — Ambulatory Visit: Payer: BC Managed Care – PPO | Admitting: Family Medicine

## 2022-12-24 VITALS — BP 128/78 | HR 87 | Temp 98.5°F | Ht 72.25 in | Wt 226.6 lb

## 2022-12-24 DIAGNOSIS — F411 Generalized anxiety disorder: Secondary | ICD-10-CM | POA: Diagnosis not present

## 2022-12-24 DIAGNOSIS — F410 Panic disorder [episodic paroxysmal anxiety] without agoraphobia: Secondary | ICD-10-CM

## 2022-12-24 DIAGNOSIS — F3342 Major depressive disorder, recurrent, in full remission: Secondary | ICD-10-CM | POA: Diagnosis not present

## 2022-12-24 MED ORDER — PAROXETINE HCL 30 MG PO TABS: 30.0000 mg | ORAL_TABLET | Freq: Every day | ORAL | 1 refills | Status: DC

## 2022-12-24 MED ORDER — PROPRANOLOL HCL 20 MG PO TABS: ORAL_TABLET | ORAL | 3 refills | Status: AC

## 2022-12-24 MED ORDER — BUPROPION HCL 75 MG PO TABS: 75.0000 mg | ORAL_TABLET | Freq: Two times a day (BID) | ORAL | 0 refills | Status: DC

## 2022-12-24 NOTE — Progress Notes (Signed)
OFFICE VISIT  12/24/2022  CC: f/u anx/pain  Patient is a 42 y.o. male who presents for routine follow-up GAD with history of panic attacks. A/P as of last visit: "1 generalized anxiety with history of panic attacks. History of depression, currently in remission. Doing well on Paxil 30 mg a day as well as Wellbutrin XL 150 mg a day. 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 Propranolol 20 mg, 1-2 every 6 hours as needed, #30, refill x3.   #2 mole on face, enlarging. Referred to dermatology.   #3 chronic neck pain with secondary headaches. Doing very well.  He has not needed a botulinum injection in 5 months. He has methocarbamol to use as needed.  He stopped gabapentin because it never helped him."  INTERIM HX: Bruin feels well. No significant problems with anxiety or depression lately. He is wondering if the Wellbutrin has really added anything to his overall wellbeing. He had originally got on it to try to counteract the side effects of Paxil (inc hunger, sweating).  He has gained quite a bit of weight over the last couple years.  He continues to see [place] attention specialist for his ADD/Vyvanse.  Past Medical History:  Diagnosis Date   ADD (attention deficit disorder)    Started seeing Dr. Johnnye Sima at [facility] [facility] 07/2019->adderall changed to vyvanse, stable on '20mg'$  qd as of 12/2019   Allergic rhinitis    + allerg conjunctivitis.  Immunotherapy started 2019   Anxiety and depression    Blood transfusion without reported diagnosis 07/16/2019   Chronic prostatitis    chronic irritative voiding symptoms: pelvic PT via [facility] helping as of 07/2016   Concussion    DDD (degenerative disc disease), cervical 08/2018   (Dr. Maia Petties):  C5-C6 --ESI 25% improvement in fall 2019.  Pt desiring C spine surgery to see if this alleviates his symptom complex..   Diverticulosis    with 'itis per pt  report; however, review of old record CT reports shows mild SB enteritis with mesenteric adenopathy 06/2015; then 11/2014 CT abd/pelv done for urinary frequency and hematuria showed NORMAL abd/pelv   Elevation of optic disc, bilateral 2019   Normal variant per ophtho--Dr. Larose Kells.   Epigastric burning sensation 2020   EGD 08/13/19->gastritis. 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 papilledema on optho exam.  Dr. [name] with neuro saw him 04/2018, no papilledema noted and opening CSF pressure was nl.  CSF labs nl.  ESR and ANA normal.  No clear reason for HA's found.  Imipr trial no help.  Trig pt inj's in occipi mm's 05/2018 by Dr. [name]. no help.Topamax trial 02/2019.   Headache syndrome 02/2018   As of 07/2018, pt has plan to see Dr. [name], NS/spine specialist as next step (his HAs are likely cervicogenic).  Dr. Maia Petties did C7-T1 ESI on 09/11/18--25% improvement.    IBS (irritable bowel syndrome)    diarr predom as per old PCP records   Nephrolithiasis    Nonobstructive, noted on CT (1-2 mm)-has never passed a stone that he knows of.   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 B12 normalized with oral B12    Past Surgical History:  Procedure Laterality Date   APPLICATION OF ROBOTIC ASSISTANCE FOR SPINAL PROCEDURE N/A 07/16/2019   Procedure: APPLICATION OF ROBOTIC ASSISTANCE FOR SPINAL PROCEDURE;  Surgeon: Kristeen Miss, MD;  Location: [facility];  Service: Neurosurgery;  Laterality: N/A;   ESOPHAGOGASTRODUODENOSCOPY  08/13/2019   Antral gastritis; H pylori NEG   LASIK Bilateral 2011   POSTERIOR LUMBAR FUSION 4 LEVEL N/A 07/16/2019   Procedure: Thoracic Four to  Lumbar Two posterior fusion, Kyphectomy with osteotomies, segmental pedicle screw fixation;  Surgeon: Kristeen Miss, MD;  Location: [facility];  Service: Neurosurgery;  Laterality: N/A;  Thoracic Four to Lumbar Two posterior fusion, Kyphectomy with osteotomies, segmental pedicle screw fixation   REFRACTIVE SURGERY Bilateral     Outpatient Medications Prior to Visit  Medication Sig Dispense Refill   fexofenadine (ALLEGRA) 180 MG tablet Take 180 mg by mouth daily.     methocarbamol (ROBAXIN) 500 MG tablet Take 1 tablet by mouth every 6 (six) hours.     VYVANSE 20 MG capsule Take 1 capsule by mouth every morning.     buPROPion (WELLBUTRIN XL) 150 MG 24 hr tablet TAKE 1 TABLET BY MOUTH EVERY DAY 17 tablet 0   PARoxetine (PAXIL) 30 MG tablet TAKE 1 TABLET BY MOUTH EVERY DAY 14 tablet 0   propranolol (INDERAL) 20 MG tablet 1-2 tabs po q6h prn anxiety 30 tablet 3   No facility-administered medications prior to visit.    No Known Allergies  Review of Systems As per HPI  PE:    12/24/2022    1:09 PM 06/23/2022    2:41 PM 03/02/2022    8:57 AM  Vitals with BMI  Height 6' 0.25" 6' 0.25" 6' 0.25"  Weight 226 lbs 10 oz 207 lbs 13 oz 204 lbs 6 oz  BMI 30.53 24.40 10.27  Systolic 253 664 403  Diastolic 78 79 79  Pulse 87 82 68   Physical Exam  Gen: Alert, well appearing.  Patient is oriented to person, place, time, and situation. AFFECT: pleasant, lucid thought and speech. No further exam today  LABS:  Last CBC Lab Results  Component Value Date   WBC 5.1 03/02/2022   HGB 15.2 03/02/2022   HCT 44.3 03/02/2022   MCV 91.2 03/02/2022   MCH 31.2 07/19/2019   RDW 13.9 03/02/2022   PLT 181.0 47/42/5956   Last metabolic panel Lab Results  Component Value Date   GLUCOSE 92 03/02/2022   NA 142 03/02/2022   K 4.1 03/02/2022   CL 103 03/02/2022   CO2 32 03/02/2022   BUN 11 03/02/2022   CREATININE 0.88 03/02/2022   GFRNONAA >60 07/19/2019   CALCIUM 9.6 03/02/2022   PHOS 3.8 03/20/2021    PROT 6.6 03/02/2022   ALBUMIN 4.5 03/02/2022   BILITOT 0.9 03/02/2022   ALKPHOS 71 03/02/2022   AST 14 03/02/2022   ALT 18 03/02/2022   ANIONGAP 5 07/19/2019   Last lipids Lab Results  Component Value Date   CHOL 190 03/02/2022   HDL 60.40 03/02/2022   LDLCALC 115 (H) 03/02/2022   TRIG 74.0 03/02/2022   CHOLHDL 3 03/02/2022   Last hemoglobin A1c Lab Results  Component Value Date   HGBA1C 4.7 07/29/2016   Last thyroid functions Lab Results  Component Value Date   TSH 1.55 03/02/2022   T3TOTAL 121.0 07/29/2016    IMPRESSION AND PLAN:  Generalized anxiety with history of panic attacks. History of depression, currently  in remission. Doing well on Paxil 30 mg a day as well as Wellbutrin XL 150 mg a day. He wants to try weaning off the Wellbutrin to see how things go so we changed to bupropion IR/short acting 75 mg tabs, we discussed slow wean over the next month or so.  He will call if not doing well and we will get him back on the Wellbutrin XL at 150 mg a day. Continue Paxil 30 mg a day.  An After Visit Summary was printed and given to the patient.  FOLLOW UP: Return in about 6 months (around 06/24/2023) for routine chronic illness f/u. Cpe 6 mo  Signed:  Crissie Sickles, MD           12/24/2022

## 2023-04-13 DIAGNOSIS — Z79899 Other long term (current) drug therapy: Secondary | ICD-10-CM | POA: Diagnosis not present

## 2023-04-13 DIAGNOSIS — F902 Attention-deficit hyperactivity disorder, combined type: Secondary | ICD-10-CM | POA: Diagnosis not present

## 2023-06-23 NOTE — Patient Instructions (Signed)

## 2023-06-24 ENCOUNTER — Telehealth: Payer: Self-pay

## 2023-06-24 ENCOUNTER — Ambulatory Visit: Payer: BC Managed Care – PPO | Admitting: Family Medicine

## 2023-06-24 VITALS — BP 136/84 | HR 93 | Wt 228.0 lb

## 2023-06-24 DIAGNOSIS — Z7689 Persons encountering health services in other specified circumstances: Secondary | ICD-10-CM

## 2023-06-24 DIAGNOSIS — F410 Panic disorder [episodic paroxysmal anxiety] without agoraphobia: Secondary | ICD-10-CM

## 2023-06-24 DIAGNOSIS — R635 Abnormal weight gain: Secondary | ICD-10-CM

## 2023-06-24 DIAGNOSIS — E669 Obesity, unspecified: Secondary | ICD-10-CM

## 2023-06-24 DIAGNOSIS — F411 Generalized anxiety disorder: Secondary | ICD-10-CM | POA: Diagnosis not present

## 2023-06-24 DIAGNOSIS — T887XXA Unspecified adverse effect of drug or medicament, initial encounter: Secondary | ICD-10-CM

## 2023-06-24 LAB — COMPREHENSIVE METABOLIC PANEL
ALT: 39 U/L (ref 0–53)
AST: 19 U/L (ref 0–37)
Albumin: 4.4 g/dL (ref 3.5–5.2)
Alkaline Phosphatase: 83 U/L (ref 39–117)
BUN: 11 mg/dL (ref 6–23)
CO2: 30 mEq/L (ref 19–32)
Calcium: 9.1 mg/dL (ref 8.4–10.5)
Chloride: 102 mEq/L (ref 96–112)
Creatinine, Ser: 1.06 mg/dL (ref 0.40–1.50)
GFR: 86.78 mL/min (ref >60.00)
Glucose, Bld: 94 mg/dL (ref 70–99)
Potassium: 3.6 mEq/L (ref 3.5–5.1)
Sodium: 139 mEq/L (ref 135–145)
Total Bilirubin: 1.3 mg/dL — ABNORMAL HIGH (ref 0.2–1.2)
Total Protein: 6.7 g/dL (ref 6.0–8.3)

## 2023-06-24 LAB — LIPID PANEL
Cholesterol: 185 mg/dL (ref 0–200)
HDL: 39.6 mg/dL (ref >39.00)
LDL Cholesterol: 116 mg/dL — ABNORMAL HIGH (ref 0–99)
NonHDL: 145.29
Total CHOL/HDL Ratio: 5
Triglycerides: 145 mg/dL (ref 0.0–149.0)
VLDL: 29 mg/dL (ref 0.0–40.0)

## 2023-06-24 LAB — TSH: TSH: 1.49 u[IU]/mL (ref 0.35–5.50)

## 2023-06-24 MED ORDER — ONDANSETRON HCL 8 MG PO TABS: 8.0000 mg | ORAL_TABLET | Freq: Three times a day (TID) | ORAL | 1 refills | Status: DC | PRN

## 2023-06-24 MED ORDER — SEMAGLUTIDE(0.25 OR 0.5MG/DOS) 2 MG/3ML ~~LOC~~ SOPN: PEN_INJECTOR | SUBCUTANEOUS | 0 refills | Status: DC

## 2023-06-24 MED ORDER — PAROXETINE HCL 30 MG PO TABS: 30.0000 mg | ORAL_TABLET | Freq: Every day | ORAL | 1 refills | Status: DC

## 2023-06-24 MED ORDER — SCOPOLAMINE 1 MG/3DAYS TD PT72: 1.0000 | MEDICATED_PATCH | TRANSDERMAL | 0 refills | Status: DC

## 2023-06-24 NOTE — Telephone Encounter (Signed)
Bernard Dalton (Key: [id number]) Rx #: [id number] Ozempic (0.25 or 0.5 MG/DOSE) 2MG Bernard Dalton pen-injectors Form [employer] Form [phone] NCPDP)  Message from Plan Product not covered for this health plan/disease state/medication as submitted. Product not covered by this plan for this diagnosis. For a FDA label approved diagnosis, please resubmit with an ICD 10 code or submit via other methods.

## 2023-06-24 NOTE — Progress Notes (Signed)
OFFICE VISIT  06/24/2023  CC:  Chief Complaint  Patient presents with   Medical Management of Chronic Issues    Patient is a 42 y.o. male who presents for 13-month follow-up GAD with history of panic attacks. A/P as of last visit: "Generalized anxiety with history of panic attacks. History of depression, currently in remission. Doing well on Paxil 30 mg a day as well as Wellbutrin XL 150 mg a day. He wants to try weaning off the Wellbutrin to see how things go so we changed to bupropion IR/short acting 75 mg tabs, we discussed slow wean over the next month or so.  He will call if not doing well and we will get him back on the Wellbutrin XL at 150 mg a day. Continue Paxil 30 mg a day."  INTERIM HX: Bernard Dalton feels very good from a emotional/mood/anxiety standpoint. Best he has felt in a long time. No problems getting off the bupropion. Continues Paxil 30 mg a day. He is frustrated with the gradual weight gain he has had on Paxil, though.  Over the last 1-1/2 years he has gained about 50 pounds.  He does not exercise.  He feels like he eats without thinking about it a lot.  He wants to try weight loss medication such as Ozempic.  ROS as above, plus--> he has had a recent URI with cough that is gradually improving over the last week. No fevers, no CP, no SOB, no wheezing,  no dizziness, no rashes, no melena/hematochezia.  No polyuria or polydipsia.  No myalgias or arthralgias.  No focal weakness, paresthesias, or tremors.  No acute vision or hearing abnormalities.  No dysuria or unusual/new urinary urgency or frequency.  No recent changes in lower legs. No n/v/d or abd pain.  No palpitations.     Past Medical History:  Diagnosis Date   ADD (attention deficit disorder)    Started seeing Dr. Elisabeth Most at [facility] 07/2019->adderall changed to vyvanse, stable on 20mg  qd as of 12/2019   Allergic rhinitis    + allerg conjunctivitis.  Immunotherapy started 2019   Anxiety and  depression    Blood transfusion without reported diagnosis 07/16/2019   Chronic prostatitis    chronic irritative voiding symptoms: pelvic PT via [facility] helping as of 07/2016   Concussion    DDD (degenerative disc disease), cervical 08/2018   (Dr. Retia Passe):  C5-C6 --ESI 25% improvement in fall 2019.  Pt desiring C spine surgery to see if this alleviates his symptom complex..   Diverticulosis    with 'itis per pt report; however, review of old record CT reports shows mild SB enteritis with mesenteric adenopathy 06/2015; then 11/2014 CT abd/pelv done for urinary frequency and hematuria showed NORMAL abd/pelv   Elevation of optic disc, bilateral 2019   Normal variant per ophtho--Dr. Hardie Shackleton.   Epigastric burning sensation 2020   EGD 08/13/19->gastritis. 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 papilledema on optho exam.  Dr. Epimenio Foot with neuro saw him 04/2018, no papilledema noted and opening CSF pressure was nl.  CSF labs nl.  ESR and ANA normal.  No clear reason for HA's found.  Imipr trial no help.  Trig pt inj's in occipi mm's 05/2018 by Dr. Epimenio Foot. no help.Topamax trial 02/2019.   Headache syndrome 02/2018   As of 07/2018, pt has plan to see Dr. Noel Gerold, NS/spine specialist as next step (  his HAs are likely cervicogenic).  Dr. Retia Passe did C7-T1 ESI on 09/11/18--25% improvement.    IBS (irritable bowel syndrome)    diarr predom as per old PCP records   Nephrolithiasis    Nonobstructive, noted on CT (1-2 mm)-has never passed a stone that he knows of.   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 B12 normalized with oral B12    Past Surgical History:   Procedure Laterality Date   APPLICATION OF ROBOTIC ASSISTANCE FOR SPINAL PROCEDURE N/A 07/16/2019   Procedure: APPLICATION OF ROBOTIC ASSISTANCE FOR SPINAL PROCEDURE;  Surgeon: Barnett Abu, MD;  Location: [facility];  Service: Neurosurgery;  Laterality: N/A;   ESOPHAGOGASTRODUODENOSCOPY  08/13/2019   Antral gastritis; H pylori NEG   LASIK Bilateral 2011   POSTERIOR LUMBAR FUSION 4 LEVEL N/A 07/16/2019   Procedure: Thoracic Four to Lumbar Two posterior fusion, Kyphectomy with osteotomies, segmental pedicle screw fixation;  Surgeon: Barnett Abu, MD;  Location: [facility];  Service: Neurosurgery;  Laterality: N/A;  Thoracic Four to Lumbar Two posterior fusion, Kyphectomy with osteotomies, segmental pedicle screw fixation   REFRACTIVE SURGERY Bilateral     Outpatient Medications Prior to Visit  Medication Sig Dispense Refill   fexofenadine (ALLEGRA) 180 MG tablet Take 180 mg by mouth daily.     methocarbamol (ROBAXIN) 500 MG tablet Take 1 tablet by mouth every 6 (six) hours.     propranolol (INDERAL) 20 MG tablet 1-2 tabs po q6h prn anxiety 30 tablet 3   VYVANSE 20 MG capsule Take 1 capsule by mouth every morning.     buPROPion (WELLBUTRIN) 75 MG tablet Take 1 tablet (75 mg total) by mouth 2 (two) times daily. (Patient not taking: Reported on 06/24/2023) 60 tablet 0   PARoxetine (PAXIL) 30 MG tablet Take 1 tablet (30 mg total) by mouth daily. 90 tablet 1   No facility-administered medications prior to visit.    No Known Allergies  Review of Systems As per HPI  PE:    06/24/2023    1:05 PM 06/24/2023    1:00 PM 12/24/2022    1:09 PM  Vitals with BMI  Height   6' 0.25"  Weight  228 lbs 226 lbs 10 oz  BMI   30.53  Systolic 136 145 161  Diastolic 84 94 78  Pulse  93 87    Physical Exam  Gen: Alert, well appearing.  Patient is oriented to person, place, time, and situation. CV: RRR, no m/r/g.   LUNGS: CTA bilat, nonlabored resps, good aeration in all lung fields.   LABS:  Last CBC Lab  Results  Component Value Date   WBC 5.1 03/02/2022   HGB 15.2 03/02/2022   HCT 44.3 03/02/2022   MCV 91.2 03/02/2022   MCH 31.2 07/19/2019   RDW 13.9 03/02/2022   PLT 181.0 03/02/2022   Last metabolic panel Lab Results  Component Value Date   GLUCOSE 92 03/02/2022   NA 142 03/02/2022   K 4.1 03/02/2022   CL 103 03/02/2022   CO2 32 03/02/2022   BUN 11 03/02/2022   CREATININE 0.88 03/02/2022   GFR 107.31 03/02/2022   CALCIUM 9.6 03/02/2022   PHOS 3.8 03/20/2021   PROT 6.6 03/02/2022   ALBUMIN 4.5 03/02/2022   BILITOT 0.9 03/02/2022   ALKPHOS  71 03/02/2022   AST 14 03/02/2022   ALT 18 03/02/2022   ANIONGAP 5 07/19/2019   Last lipids Lab Results  Component Value Date   CHOL 190 03/02/2022   HDL 60.40 03/02/2022   LDLCALC 115 (H) 03/02/2022   TRIG 74.0 03/02/2022   CHOLHDL 3 03/02/2022   Last hemoglobin A1c Lab Results  Component Value Date   HGBA1C 4.7 07/29/2016   Last thyroid functions Lab Results  Component Value Date   TSH 1.55 03/02/2022   T3TOTAL 121.0 07/29/2016    Last vitamin B12 and Folate Lab Results  Component Value Date   VITAMINB12 921 (H) 09/25/2019   IMPRESSION AND PLAN:  #1 GAD with history of panic disorder. Doing great on Paxil 30 mg a day.  2.  Abnormal weight gain.  BMI is 30. The very large majority of this weight gain has come since getting on Paxil. He says he is willing to try to pay out-of-pocket for Ozempic since it will not be covered by his insurance. Ozempic 0.5 mg subcu weekly prescribed. Therapeutic expectations and side effect profile of medication discussed today.  Patient's questions answered. Zofran 8mg , 1 tid prn nausea.  Check c-Met, lipid panel, and TSH today.  An After Visit Summary was printed and given to the patient.  FOLLOW UP: Return in about 4 weeks (around 07/22/2023) for routine chronic illness f/u.  Signed:  Santiago Bumpers, MD           06/24/2023

## 2023-07-01 ENCOUNTER — Ambulatory Visit (INDEPENDENT_AMBULATORY_CARE_PROVIDER_SITE_OTHER): Payer: BC Managed Care – PPO | Admitting: Family Medicine

## 2023-07-01 ENCOUNTER — Encounter: Payer: Self-pay | Admitting: Family Medicine

## 2023-07-01 VITALS — BP 138/80 | HR 89 | Temp 98.7°F | Wt 228.2 lb

## 2023-07-01 DIAGNOSIS — J209 Acute bronchitis, unspecified: Secondary | ICD-10-CM | POA: Diagnosis not present

## 2023-07-01 MED ORDER — DULERA 50-5 MCG/ACT IN AERO: INHALATION_SPRAY | RESPIRATORY_TRACT | 0 refills | Status: AC

## 2023-07-01 MED ORDER — ALBUTEROL SULFATE (2.5 MG/3ML) 0.083% IN NEBU: 2.5000 mg | INHALATION_SOLUTION | Freq: Four times a day (QID) | RESPIRATORY_TRACT | 1 refills | Status: DC | PRN

## 2023-07-01 NOTE — Progress Notes (Signed)
OFFICE VISIT  07/01/2023  CC:  Chief Complaint  Patient presents with   Cough    Cough that's be going on for awhile.    Patient is a 42 y.o. male who presents for cough.  HPI: 2-3 wk hx nasal cong/runny nose, PND, and nonprod cough. No f/c/malaise. No face pain.  No n/v.  No wheeze or SOB. If takes deep breath he has coughing fit. Mucinex ?helping.  Past Medical History:  Diagnosis Date   ADD (attention deficit disorder)    Started seeing Dr. Elisabeth Most at [facility] 07/2019->adderall changed to vyvanse, stable on 20mg  qd as of 12/2019   Allergic rhinitis    + allerg conjunctivitis.  Immunotherapy started 2019   Anxiety and depression    Blood transfusion without reported diagnosis 07/16/2019   Chronic prostatitis    chronic irritative voiding symptoms: pelvic PT via [facility] helping as of 07/2016   Concussion    DDD (degenerative disc disease), cervical 08/2018   (Dr. Retia Passe):  C5-C6 --ESI 25% improvement in fall 2019.  Pt desiring C spine surgery to see if this alleviates his symptom complex..   Diverticulosis    with 'itis per pt report; however, review of old record CT reports shows mild SB enteritis with mesenteric adenopathy 06/2015; then 11/2014 CT abd/pelv done for urinary frequency and hematuria showed NORMAL abd/pelv   Elevation of optic disc, bilateral 2019   Normal variant per ophtho--Dr. Hardie Shackleton.   Epigastric burning sensation 2020   EGD 08/13/19->gastritis. 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 papilledema on optho exam.  Dr. Epimenio Foot with neuro saw him 04/2018, no papilledema noted and opening CSF pressure was nl.  CSF labs nl.  ESR and ANA normal.  No clear reason for HA's found.  Imipr trial no help.  Trig pt inj's in occipi mm's 05/2018 by Dr. Epimenio Foot. no help.Topamax trial 02/2019.   Headache syndrome 02/2018   As of  07/2018, pt has plan to see Dr. Noel Gerold, NS/spine specialist as next step (his HAs are likely cervicogenic).  Dr. Retia Passe did C7-T1 ESI on 09/11/18--25% improvement.    IBS (irritable bowel syndrome)    diarr predom as per old PCP records   Nephrolithiasis    Nonobstructive, noted on CT (1-2 mm)-has never passed a stone that he knows of.   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 B12 normalized with oral B12    Past Surgical History:  Procedure Laterality Date   APPLICATION OF ROBOTIC ASSISTANCE FOR SPINAL PROCEDURE N/A 07/16/2019   Procedure: APPLICATION OF ROBOTIC ASSISTANCE FOR SPINAL PROCEDURE;  Surgeon: Barnett Abu, MD;  Location: [facility];  Service: Neurosurgery;  Laterality: N/A;   ESOPHAGOGASTRODUODENOSCOPY  08/13/2019   Antral gastritis; H pylori NEG   LASIK Bilateral 2011   POSTERIOR LUMBAR FUSION 4 LEVEL N/A 07/16/2019   Procedure: Thoracic Four to Lumbar Two posterior fusion, Kyphectomy with osteotomies, segmental pedicle screw fixation;  Surgeon: Barnett Abu, MD;  Location: [facility];  Service: Neurosurgery;  Laterality: N/A;  Thoracic Four to Lumbar Two posterior fusion, Kyphectomy with osteotomies, segmental pedicle screw fixation   REFRACTIVE SURGERY Bilateral     Outpatient Medications Prior to  Visit  Medication Sig Dispense Refill   fexofenadine (ALLEGRA) 180 MG tablet Take 180 mg by mouth daily.     methocarbamol (ROBAXIN) 500 MG tablet Take 1 tablet by mouth every 6 (six) hours.     ondansetron (ZOFRAN) 8 MG tablet Take 1 tablet (8 mg total) by mouth every 8 (eight) hours as needed for nausea or vomiting. 30 tablet 1   PARoxetine (PAXIL) 30 MG tablet Take 1 tablet (30 mg total) by mouth daily. 90 tablet 1   propranolol (INDERAL) 20 MG tablet 1-2 tabs po q6h  prn anxiety 30 tablet 3   scopolamine (TRANSDERM-SCOP) 1 MG/3DAYS Place 1 patch (1.5 mg total) onto the skin every 3 (three) days. 4 patch 0   Semaglutide,0.25 or 0.5MG /DOS, 2 MG/3ML SOPN 0.5mg  SQ q week 3 mL 0   VYVANSE 20 MG capsule Take 1 capsule by mouth every morning.     No facility-administered medications prior to visit.    No Known Allergies  Review of Systems  As per HPI  PE:    07/01/2023    9:05 AM 06/24/2023    1:05 PM 06/24/2023    1:00 PM  Vitals with BMI  Weight 228 lbs 3 oz  228 lbs  Systolic 149 136 811  Diastolic 84 84 94  Pulse 89  93     Physical Exam  VS: noted--normal. Gen: alert, NAD, NONTOXIC APPEARING. HEENT: eyes without injection, drainage, or swelling.  Ears: EACs clear, TMs with normal light reflex and landmarks.  Nose: Clear rhinorrhea, with some dried, crusty exudate adherent to mildly injected mucosa.  No purulent d/c.  No paranasal sinus TTP.  No facial swelling.  Throat and mouth without focal lesion.  No pharyngial swelling, erythema, or exudate.   Neck: supple, no LAD.   LUNGS: CTA bilat, nonlabored resps.  Lots of dry coughing with deep insp CV: RRR, no m/r/g. EXT: no c/c/e SKIN: no rash   LABS:  none  IMPRESSION AND PLAN:  URI w/acute bronchitis. He has bad psych side effects from prednisone. Rx'd dulera 50-5, 2 p bid for the next 2 wks or so. I rx'd Albuterol neb sol'n to use in his nebulizer bid prn severe cough (has machine b/c kids needed it at one point). OK to cont mucinex dm.  An After Visit Summary was printed and given to the patient.  FOLLOW UP: No follow-ups on file.  Signed:  Santiago Bumpers, MD           07/01/2023

## 2023-07-13 ENCOUNTER — Encounter (INDEPENDENT_AMBULATORY_CARE_PROVIDER_SITE_OTHER): Payer: Self-pay

## 2023-08-11 ENCOUNTER — Encounter: Payer: Self-pay | Admitting: Family Medicine

## 2023-08-11 ENCOUNTER — Ambulatory Visit: Payer: BC Managed Care – PPO | Admitting: Family Medicine

## 2023-08-11 VITALS — BP 114/73 | HR 87 | Temp 97.8°F | Wt 214.4 lb

## 2023-08-11 DIAGNOSIS — Z23 Encounter for immunization: Secondary | ICD-10-CM

## 2023-08-11 DIAGNOSIS — Z7689 Persons encountering health services in other specified circumstances: Secondary | ICD-10-CM

## 2023-08-11 DIAGNOSIS — Z6828 Body mass index (BMI) 28.0-28.9, adult: Secondary | ICD-10-CM

## 2023-08-11 DIAGNOSIS — E669 Obesity, unspecified: Secondary | ICD-10-CM

## 2023-08-11 MED ORDER — ZEPBOUND 2.5 MG/0.5ML ~~LOC~~ SOAJ: 2.5000 mg | SUBCUTANEOUS | 2 refills | Status: DC

## 2023-08-11 NOTE — Progress Notes (Signed)
OFFICE VISIT  08/11/2023  CC:  Chief Complaint  Patient presents with   Follow-up    Follow up    Patient is a 42 y.o. male who presents for 6-week follow-up GAD and panic disorder and weight management. A/P as of last visit: "#1 GAD with history of panic disorder. Doing great on Paxil 30 mg a day.   2.  Abnormal weight gain.  BMI is 30. The very large majority of this weight gain has come since getting on Paxil. He says he is willing to try to pay out-of-pocket for Ozempic since it will not be covered by his insurance. Ozempic 0.5 mg subcu weekly prescribed. Therapeutic expectations and side effect profile of medication discussed today.  Patient's questions answered. Zofran 8mg , 1 tid prn nausea.  Check c-Met, lipid panel, and TSH today."  INTERIM HX: Bernard Dalton says he feels great. No nausea, no constipation, no diarrhea. He just took his fourth injection of the Ozempic at the 0.25 mg dosing.  Mood and anxiety levels are stable.  Past Medical History:  Diagnosis Date   ADD (attention deficit disorder)    Started seeing Dr. Elisabeth Most at [facility] 07/2019->adderall changed to vyvanse, stable on 20mg  qd as of 12/2019   Allergic rhinitis    + allerg conjunctivitis.  Immunotherapy started 2019   Anxiety and depression    Blood transfusion without reported diagnosis 07/16/2019   Chronic prostatitis    chronic irritative voiding symptoms: pelvic PT via [facility] helping as of 07/2016   Concussion    DDD (degenerative disc disease), cervical 08/2018   (Dr. Retia Passe):  C5-C6 --ESI 25% improvement in fall 2019.  Pt desiring C spine surgery to see if this alleviates his symptom complex..   Diverticulosis    with 'itis per pt report; however, review of old record CT reports shows mild SB enteritis with mesenteric adenopathy 06/2015; then 11/2014 CT abd/pelv done for urinary frequency and hematuria showed NORMAL abd/pelv   Elevation of optic disc, bilateral 2019    Normal variant per ophtho--Dr. Hardie Shackleton.   Epigastric burning sensation 2020   EGD 08/13/19->gastritis. 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 papilledema on optho exam.  Dr. Epimenio Foot with neuro saw him 04/2018, no papilledema noted and opening CSF pressure was nl.  CSF labs nl.  ESR and ANA normal.  No clear reason for HA's found.  Imipr trial no help.  Trig pt inj's in occipi mm's 05/2018 by Dr. Epimenio Foot. no help.Topamax trial 02/2019.   Headache syndrome 02/2018   As of 07/2018, pt has plan to see Dr. Noel Gerold, NS/spine specialist as next step (his HAs are likely cervicogenic).  Dr. Retia Passe did C7-T1 ESI on 09/11/18--25% improvement.    IBS (irritable bowel syndrome)    diarr predom as per old PCP records   Nephrolithiasis    Nonobstructive, noted on CT (1-2 mm)-has never passed a stone that he knows of.   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 B12 normalized with oral B12    Past Surgical History:  Procedure Laterality Date   APPLICATION OF ROBOTIC ASSISTANCE FOR SPINAL PROCEDURE N/A 07/16/2019   Procedure: APPLICATION OF ROBOTIC ASSISTANCE FOR  SPINAL PROCEDURE;  Surgeon: Barnett Abu, MD;  Location: [facility] OR;  Service: Neurosurgery;  Laterality: N/A;   ESOPHAGOGASTRODUODENOSCOPY  08/13/2019   Antral gastritis; H pylori NEG   LASIK Bilateral 2011   POSTERIOR LUMBAR FUSION 4 LEVEL N/A 07/16/2019   Procedure: Thoracic Four to Lumbar Two posterior fusion, Kyphectomy with osteotomies, segmental pedicle screw fixation;  Surgeon: Barnett Abu, MD;  Location: [facility] OR;  Service: Neurosurgery;  Laterality: N/A;  Thoracic Four to Lumbar Two posterior fusion, Kyphectomy with  osteotomies, segmental pedicle screw fixation   REFRACTIVE SURGERY Bilateral     Outpatient Medications Prior to Visit  Medication Sig Dispense Refill   fexofenadine (ALLEGRA) 180 MG tablet Take 180 mg by mouth daily.     methocarbamol (ROBAXIN) 500 MG tablet Take 1 tablet by mouth every 6 (six) hours.     Mometasone Furo-Formoterol Fum (DULERA) 50-5 MCG/ACT AERO 2 puffs bid 13 each 0   ondansetron (ZOFRAN) 8 MG tablet Take 1 tablet (8 mg total) by mouth every 8 (eight) hours as needed for nausea or vomiting. 30 tablet 1   PARoxetine (PAXIL) 30 MG tablet Take 1 tablet (30 mg total) by mouth daily. 90 tablet 1   propranolol (INDERAL) 20 MG tablet 1-2 tabs po q6h prn anxiety 30 tablet 3   scopolamine (TRANSDERM-SCOP) 1 MG/3DAYS Place 1 patch (1.5 mg total) onto the skin every 3 (three) days. 4 patch 0   VYVANSE 20 MG capsule Take 1 capsule by mouth every morning.     Semaglutide,0.25 or 0.5MG /DOS, 2 MG/3ML SOPN 0.5mg  SQ q week 3 mL 0   albuterol (PROVENTIL) (2.5 MG/3ML) 0.083% nebulizer solution Take 3 mLs (2.5 mg total) by nebulization every 6 (six) hours as needed for wheezing or shortness of breath. (Patient not taking: Reported on 08/11/2023) 30 mL 1   No facility-administered medications prior to visit.    No Known Allergies  Review of Systems As per HPI  PE:    08/11/2023    2:48 PM 07/01/2023    9:25 AM 07/01/2023    9:05 AM  Vitals with BMI  Weight 214 lbs 6 oz  228 lbs 3 oz  Systolic 114 138 956  Diastolic 73 80 84  Pulse 87  89   Physical Exam  Gen: Alert, well appearing.  Patient is oriented to person, place, time, and situation. AFFECT: pleasant, lucid thought and speech.   LABS:  Last metabolic panel Lab Results  Component Value Date   GLUCOSE 94 06/24/2023   NA 139 06/24/2023   K 3.6 06/24/2023   CL 102 06/24/2023   CO2 30 06/24/2023   BUN 11 06/24/2023   CREATININE 1.06 06/24/2023   GFR 86.78 06/24/2023   CALCIUM 9.1 06/24/2023   PHOS 3.8 03/20/2021    PROT 6.7 06/24/2023   ALBUMIN 4.4 06/24/2023   BILITOT 1.3 (H) 06/24/2023   ALKPHOS 83 06/24/2023   AST 19 06/24/2023   ALT 39 06/24/2023   ANIONGAP 5 07/19/2019   Lab Results  Component Value Date   HGBA1C 4.7 07/29/2016   Lab Results  Component Value Date   CHOL 185 06/24/2023   HDL 39.60 06/24/2023   LDLCALC 116 (H) 06/24/2023   TRIG 145.0 06/24/2023   CHOLHDL 5 06/24/2023   Lab Results  Component Value Date   TSH 1.49 06/24/2023   IMPRESSION AND PLAN:  Obesity class I, weight management. Ozempic 0.25 mg with great results--he is down about 14 pounds over the last 6 weeks.  No side  effects. He has found a good deal online for Zepbound and wants to switch to this. I did prescription today for Zepbound 2.5 mg dose, specified vials rather than pens per patient request.  An After Visit Summary was printed and given to the patient.  FOLLOW UP: Return in about 3 months (around 11/11/2023) for routine chronic illness f/u.  Signed:  Santiago Bumpers, MD           08/11/2023

## 2023-08-12 NOTE — Addendum Note (Signed)
Addended by: Emi Holes D on: 08/12/2023 01:14 PM   Modules accepted: Orders

## 2023-08-24 ENCOUNTER — Encounter: Payer: Self-pay | Admitting: Family Medicine

## 2023-08-24 ENCOUNTER — Other Ambulatory Visit: Payer: Self-pay | Admitting: Family Medicine

## 2023-08-24 DIAGNOSIS — E669 Obesity, unspecified: Secondary | ICD-10-CM

## 2023-08-24 DIAGNOSIS — Z7689 Persons encountering health services in other specified circumstances: Secondary | ICD-10-CM

## 2023-08-24 MED ORDER — ZEPBOUND 2.5 MG/0.5ML ~~LOC~~ SOAJ: 2.5000 mg | SUBCUTANEOUS | 0 refills | Status: DC

## 2023-08-24 NOTE — Telephone Encounter (Signed)
Okay, zepbound rx sent

## 2023-08-24 NOTE — Telephone Encounter (Signed)
Please advise if okay to send for bridge medication to [facility].  PA will need to be completed

## 2023-08-29 ENCOUNTER — Other Ambulatory Visit (HOSPITAL_COMMUNITY): Payer: Self-pay

## 2023-09-05 MED ORDER — ZEPBOUND 2.5 MG/0.5ML ~~LOC~~ SOAJ: 2.5000 mg | SUBCUTANEOUS | 0 refills | Status: DC

## 2023-09-05 NOTE — Telephone Encounter (Signed)
Okay, I sent the prescription with all the instructions. If for some reason it does not go through this time I have printed the prescription that he can come pick up.

## 2023-09-12 ENCOUNTER — Other Ambulatory Visit: Payer: Self-pay | Admitting: Family Medicine

## 2023-09-26 ENCOUNTER — Encounter: Payer: Self-pay | Admitting: Family Medicine

## 2023-09-26 ENCOUNTER — Ambulatory Visit: Payer: BC Managed Care – PPO | Admitting: Family Medicine

## 2023-09-26 VITALS — BP 127/82 | HR 74 | Wt 206.0 lb

## 2023-09-26 DIAGNOSIS — H04123 Dry eye syndrome of bilateral lacrimal glands: Secondary | ICD-10-CM | POA: Diagnosis not present

## 2023-09-26 DIAGNOSIS — T887XXA Unspecified adverse effect of drug or medicament, initial encounter: Secondary | ICD-10-CM | POA: Diagnosis not present

## 2023-09-26 MED ORDER — CYCLOSPORINE 0.05 % OP EMUL: 1.0000 [drp] | Freq: Two times a day (BID) | OPHTHALMIC | 11 refills | Status: DC

## 2023-09-26 NOTE — Progress Notes (Signed)
OFFICE VISIT  09/26/2023  CC:  Chief Complaint  Patient presents with   Medication Management    Pt wants to discuss Zepbound and the effects it is giving him; pt complains of severe dry eyes.     Patient is a 42 y.o. male who presents for severe dry eyes.  HPI: He recently started Zepbound.  He started having very dry eyes that he described as feeling sandpapery.   No eye pain or visual abnormalities.  Mild dry mouth.  He had the feeling of being dehydrated.  He was headachy. He had no problem with dry eyes or dry mouth prior to this. He has been drinking lots of fluids.  He definitely feels like Zepbound is helping.  He is losing weight. No gastrointestinal side effects.  Past Medical History:  Diagnosis Date   ADD (attention deficit disorder)    Started seeing Dr. Elisabeth Most at [facility] 07/2019->adderall changed to vyvanse, stable on 20mg  qd as of 12/2019   Allergic rhinitis    + allerg conjunctivitis.  Immunotherapy started 2019   Anxiety and depression    Blood transfusion without reported diagnosis 07/16/2019   Chronic prostatitis    chronic irritative voiding symptoms: pelvic PT via [facility] helping as of 07/2016   Concussion    DDD (degenerative disc disease), cervical 08/2018   (Dr. Retia Passe):  C5-C6 --ESI 25% improvement in fall 2019.  Pt desiring C spine surgery to see if this alleviates his symptom complex..   Diverticulosis    with 'itis per pt report; however, review of old record CT reports shows mild SB enteritis with mesenteric adenopathy 06/2015; then 11/2014 CT abd/pelv done for urinary frequency and hematuria showed NORMAL abd/pelv   Elevation of optic disc, bilateral 2019   Normal variant per ophtho--Dr. Hardie Shackleton.   Epigastric burning sensation 2020   EGD 08/13/19->gastritis. 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  papilledema on optho exam.  Dr. Epimenio Foot with neuro saw him 04/2018, no papilledema noted and opening CSF pressure was nl.  CSF labs nl.  ESR and ANA normal.  No clear reason for HA's found.  Imipr trial no help.  Trig pt inj's in occipi mm's 05/2018 by Dr. Epimenio Foot. no help.Topamax trial 02/2019.   Headache syndrome 02/2018   As of 07/2018, pt has plan to see Dr. Noel Gerold, NS/spine specialist as next step (his HAs are likely cervicogenic).  Dr. Retia Passe did C7-T1 ESI on 09/11/18--25% improvement.    IBS (irritable bowel syndrome)    diarr predom as per old PCP records   Nephrolithiasis    Nonobstructive, noted on CT (1-2 mm)-has never passed a stone that he knows of.   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 B12 normalized with oral B12    Past Surgical History:  Procedure Laterality Date   APPLICATION OF ROBOTIC ASSISTANCE FOR SPINAL PROCEDURE N/A 07/16/2019   Procedure: APPLICATION OF ROBOTIC ASSISTANCE FOR SPINAL PROCEDURE;  Surgeon: Barnett Abu, MD;  Location: [facility] OR;  Service: Neurosurgery;  Laterality: N/A;   ESOPHAGOGASTRODUODENOSCOPY  08/13/2019   Antral gastritis; H pylori NEG   LASIK Bilateral 2011   POSTERIOR LUMBAR FUSION 4 LEVEL N/A 07/16/2019  Procedure: Thoracic Four to Lumbar Two posterior fusion, Kyphectomy with osteotomies, segmental pedicle screw fixation;  Surgeon: Barnett Abu, MD;  Location: [facility] OR;  Service: Neurosurgery;  Laterality: N/A;  Thoracic Four to Lumbar Two posterior fusion, Kyphectomy with osteotomies, segmental pedicle screw fixation   REFRACTIVE SURGERY Bilateral     Outpatient Medications Prior to Visit  Medication Sig Dispense Refill   fexofenadine (ALLEGRA) 180 MG tablet Take 180 mg by mouth daily.     methocarbamol (ROBAXIN) 500 MG tablet Take  1 tablet by mouth every 6 (six) hours.     Mometasone Furo-Formoterol Fum (DULERA) 50-5 MCG/ACT AERO 2 puffs bid 13 each 0   ondansetron (ZOFRAN) 8 MG tablet Take 1 tablet (8 mg total) by mouth every 8 (eight) hours as needed for nausea or vomiting. 30 tablet 1   PARoxetine (PAXIL) 30 MG tablet Take 1 tablet (30 mg total) by mouth daily. 90 tablet 1   propranolol (INDERAL) 20 MG tablet 1-2 tabs po q6h prn anxiety 30 tablet 3   scopolamine (TRANSDERM-SCOP) 1 MG/3DAYS Place 1 patch (1.5 mg total) onto the skin every 3 (three) days. 4 patch 0   tirzepatide (ZEPBOUND) 2.5 MG/0.5ML Pen Inject 2.5 mg into the skin once a week. 2 mL 0   VYVANSE 20 MG capsule Take 1 capsule by mouth every morning.     albuterol (PROVENTIL) (2.5 MG/3ML) 0.083% nebulizer solution Take 3 mLs (2.5 mg total) by nebulization every 6 (six) hours as needed for wheezing or shortness of breath. (Patient not taking: Reported on 08/11/2023) 30 mL 1   No facility-administered medications prior to visit.    No Known Allergies  Review of Systems  As per HPI  PE:    09/26/2023    3:44 PM 08/11/2023    2:48 PM 07/01/2023    9:25 AM  Vitals with BMI  Weight 206 lbs 214 lbs 6 oz   Systolic 127 114 409  Diastolic 82 73 80  Pulse 74 87      Physical Exam  Gen: Alert, well appearing.  Patient is oriented to person, place, time, and situation. AFFECT: pleasant, lucid thought and speech. No eye swelling, no bulbar or palpebral conjunctival injection.  Pupils equal and reactive.   LABS:  Last CBC Lab Results  Component Value Date   WBC 5.1 03/02/2022   HGB 15.2 03/02/2022   HCT 44.3 03/02/2022   MCV 91.2 03/02/2022   MCH 31.2 07/19/2019   RDW 13.9 03/02/2022   PLT 181.0 03/02/2022   Last metabolic panel Lab Results  Component Value Date   GLUCOSE 94 06/24/2023   NA 139 06/24/2023   K 3.6 06/24/2023   CL 102 06/24/2023   CO2 30 06/24/2023   BUN 11 06/24/2023   CREATININE 1.06 06/24/2023   GFR 86.78 06/24/2023    CALCIUM 9.1 06/24/2023   PHOS 3.8 03/20/2021   PROT 6.7 06/24/2023   ALBUMIN 4.4 06/24/2023   BILITOT 1.3 (H) 06/24/2023   ALKPHOS 83 06/24/2023   AST 19 06/24/2023   ALT 39 06/24/2023   ANIONGAP 5 07/19/2019   Lab Results  Component Value Date   HGBA1C 4.7 07/29/2016   IMPRESSION AND PLAN:  Dry eyes, side effect from his Zepbound. Trial of Restasis 1 drop each eye every 12 hours. Otherwise, Zepbound working well for him and he has lost about 10 pounds since starting it.  He feels like the current dose of 2.5 mg q. 7 days is what he wants to stay  on for now.  An After Visit Summary was printed and given to the patient.  FOLLOW UP: If not significantly improving in the next 10 to 14 days  Signed:  Santiago Bumpers, MD           09/26/2023

## 2023-10-04 ENCOUNTER — Encounter: Payer: Self-pay | Admitting: Family Medicine

## 2023-10-04 MED ORDER — SEMAGLUTIDE(0.25 OR 0.5MG/DOS) 2 MG/3ML ~~LOC~~ SOPN: PEN_INJECTOR | SUBCUTANEOUS | 0 refills | Status: DC

## 2023-10-04 NOTE — Telephone Encounter (Signed)
OK, ozempic rx sent to [facility]

## 2023-10-06 ENCOUNTER — Other Ambulatory Visit (HOSPITAL_COMMUNITY): Payer: Self-pay

## 2023-10-14 DIAGNOSIS — Z79899 Other long term (current) drug therapy: Secondary | ICD-10-CM | POA: Diagnosis not present

## 2023-10-14 DIAGNOSIS — F902 Attention-deficit hyperactivity disorder, combined type: Secondary | ICD-10-CM | POA: Diagnosis not present

## 2023-10-30 ENCOUNTER — Other Ambulatory Visit: Payer: Self-pay | Admitting: Family Medicine

## 2023-11-02 ENCOUNTER — Other Ambulatory Visit (HOSPITAL_COMMUNITY): Payer: Self-pay

## 2023-11-04 ENCOUNTER — Other Ambulatory Visit (HOSPITAL_COMMUNITY): Payer: Self-pay

## 2023-11-14 ENCOUNTER — Ambulatory Visit: Payer: BC Managed Care – PPO | Admitting: Family Medicine

## 2023-11-14 ENCOUNTER — Encounter: Payer: Self-pay | Admitting: Family Medicine

## 2023-11-14 VITALS — BP 128/83 | HR 65 | Wt 204.8 lb

## 2023-11-14 DIAGNOSIS — Z7689 Persons encountering health services in other specified circumstances: Secondary | ICD-10-CM

## 2023-11-14 DIAGNOSIS — E66811 Obesity, class 1: Secondary | ICD-10-CM | POA: Diagnosis not present

## 2023-11-14 MED ORDER — SEMAGLUTIDE (1 MG/DOSE) 4 MG/3ML ~~LOC~~ SOPN: 1.0000 mg | PEN_INJECTOR | SUBCUTANEOUS | 2 refills | Status: DC

## 2023-11-14 NOTE — Progress Notes (Signed)
OFFICE VISIT  11/14/2023  CC:  Chief Complaint  Patient presents with   Weight Management Screening    Patient is a 42 y.o. male who presents for follow-up obesity and weight management. I last saw him on 09/26/2023. A/P as of that visit: "Dry eyes, side effect from his Zepbound. Trial of Restasis 1 drop each eye every 12 hours. Otherwise, Zepbound working well for him and he has lost about 10 pounds since starting it.  He feels like the current dose of 2.5 mg q. 7 days is what he wants to stay on for now."  INTERIM HX: Insurance would not cover the Restasis. Dry eyes continued and was severe, caused headaches.  He ended up having to discontinue Zepbound. We got him onto the low-dose Ozempic and he has done well with this at the 0.25 mg weekly and 0.5 mg weekly dosing. No side effects at all.  He says he feels great.   Past Medical History:  Diagnosis Date   ADD (attention deficit disorder)    Started seeing Dr. Elisabeth Most at [facility] 07/2019->adderall changed to vyvanse, stable on 20mg  qd as of 12/2019   Allergic rhinitis    + allerg conjunctivitis.  Immunotherapy started 2019   Anxiety and depression    Blood transfusion without reported diagnosis 07/16/2019   Chronic prostatitis    chronic irritative voiding symptoms: pelvic PT via [facility] helping as of 07/2016   Concussion    DDD (degenerative disc disease), cervical 08/2018   (Dr. Retia Passe):  C5-C6 --ESI 25% improvement in fall 2019.  Pt desiring C spine surgery to see if this alleviates his symptom complex..   Diverticulosis    with 'itis per pt report; however, review of old record CT reports shows mild SB enteritis with mesenteric adenopathy 06/2015; then 11/2014 CT abd/pelv done for urinary frequency and hematuria showed NORMAL abd/pelv   Elevation of optic disc, bilateral 2019   Normal variant per ophtho--Dr. Hardie Shackleton.   Epigastric burning sensation 2020   EGD 08/13/19->gastritis. 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 papilledema on optho exam.  Dr. Epimenio Foot with neuro saw him 04/2018, no papilledema noted and opening CSF pressure was nl.  CSF labs nl.  ESR and ANA normal.  No clear reason for HA's found.  Imipr trial no help.  Trig pt inj's in occipi mm's 05/2018 by Dr. Epimenio Foot. no help.Topamax trial 02/2019.   Headache syndrome 02/2018   As of 07/2018, pt has plan to see Dr. Noel Gerold, NS/spine specialist as next step (his HAs are likely cervicogenic).  Dr. Retia Passe did C7-T1 ESI on 09/11/18--25% improvement.    IBS (irritable bowel syndrome)    diarr predom as per old PCP records   Nephrolithiasis    Nonobstructive, noted on CT (1-2 mm)-has never passed a stone that he knows of.   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 B12 normalized with oral B12    Past Surgical History:  Procedure Laterality Date   APPLICATION OF ROBOTIC ASSISTANCE FOR SPINAL PROCEDURE N/A 07/16/2019   Procedure: APPLICATION OF ROBOTIC ASSISTANCE FOR SPINAL PROCEDURE;  Surgeon: Barnett Abu, MD;  Location: [facility] OR;  Service: Neurosurgery;  Laterality: N/A;   ESOPHAGOGASTRODUODENOSCOPY  08/13/2019   Antral gastritis; H pylori NEG   LASIK Bilateral 2011   POSTERIOR LUMBAR FUSION 4 LEVEL N/A 07/16/2019   Procedure: Thoracic Four to Lumbar Two posterior fusion, Kyphectomy with osteotomies, segmental pedicle screw fixation;  Surgeon: Barnett Abu, MD;  Location: [facility] OR;  Service: Neurosurgery;  Laterality: N/A;  Thoracic Four to Lumbar Two posterior fusion, Kyphectomy with osteotomies, segmental pedicle screw fixation   REFRACTIVE SURGERY Bilateral     Outpatient Medications Prior to  Visit  Medication Sig Dispense Refill   cycloSPORINE (RESTASIS) 0.05 % ophthalmic emulsion Place 1 drop into both eyes 2 (two) times daily. 60 each 11   fexofenadine (ALLEGRA) 180 MG tablet Take 180 mg by mouth daily.     PARoxetine (PAXIL) 30 MG tablet Take 1 tablet (30 mg total) by mouth daily. 90 tablet 1   propranolol (INDERAL) 20 MG tablet 1-2 tabs po q6h prn anxiety 30 tablet 3   VYVANSE 20 MG capsule Take 1 capsule by mouth every morning.     Semaglutide,0.25 or 0.5MG /DOS, (OZEMPIC, 0.25 OR 0.5 MG/DOSE,) 2 MG/3ML SOPN INJECT 0.5MG  SUBCUTANEOUSLY ONCE WEEKLY 3 mL 1   albuterol (PROVENTIL) (2.5 MG/3ML) 0.083% nebulizer solution Take 3 mLs (2.5 mg total) by nebulization every 6 (six) hours as needed for wheezing or shortness of breath. (Patient not taking: Reported on 08/11/2023) 30 mL 1   methocarbamol (ROBAXIN) 500 MG tablet Take 1 tablet by mouth every 6 (six) hours. (Patient not taking: Reported on 11/14/2023)     Mometasone Furo-Formoterol Fum (DULERA) 50-5 MCG/ACT AERO 2 puffs bid (Patient not taking: Reported on 11/14/2023) 13 each 0   ondansetron (ZOFRAN) 8 MG tablet Take 1 tablet (8 mg total) by mouth every 8 (eight) hours as needed for nausea or vomiting. (Patient not taking: Reported on 11/14/2023) 30 tablet 1   scopolamine (TRANSDERM-SCOP) 1 MG/3DAYS Place 1 patch (1.5 mg total) onto the skin every 3 (three) days. (Patient not taking: Reported on 11/14/2023) 4 patch 0   No facility-administered medications prior to visit.    No Known Allergies  Review of Systems As per HPI  PE:    11/14/2023    2:03 PM 09/26/2023    3:44 PM 08/11/2023    2:48 PM  Vitals with BMI  Weight 204 lbs 13 oz 206 lbs 214 lbs 6 oz  Systolic 128 127 706  Diastolic 83 82 73  Pulse 65 74 87  Body mass index is 27.58 kg/m.    Physical Exam  Gen: Alert, well appearing.  Patient is oriented to person, place, time, and situation. AFFECT: pleasant, lucid thought and speech. No further exam  today  LABS:  Last CBC Lab Results  Component Value Date   WBC 5.1 03/02/2022   HGB 15.2 03/02/2022   HCT 44.3 03/02/2022   MCV 91.2 03/02/2022   MCH 31.2 07/19/2019   RDW 13.9 03/02/2022   PLT 181.0 03/02/2022   Last metabolic panel Lab Results  Component Value Date   GLUCOSE 94 06/24/2023   NA 139 06/24/2023   K 3.6 06/24/2023   CL 102 06/24/2023   CO2 30 06/24/2023   BUN 11 06/24/2023   CREATININE 1.06 06/24/2023   GFR 86.78 06/24/2023   CALCIUM 9.1 06/24/2023   PHOS 3.8 03/20/2021   PROT 6.7 06/24/2023   ALBUMIN 4.4 06/24/2023   BILITOT 1.3 (H) 06/24/2023   ALKPHOS 83 06/24/2023   AST 19 06/24/2023   ALT 39 06/24/2023  ANIONGAP 5 07/19/2019   Last lipids Lab Results  Component Value Date   CHOL 185 06/24/2023   HDL 39.60 06/24/2023   LDLCALC 116 (H) 06/24/2023   TRIG 145.0 06/24/2023   CHOLHDL 5 06/24/2023   Last hemoglobin A1c Lab Results  Component Value Date   HGBA1C 4.7 07/29/2016   Last thyroid functions Lab Results  Component Value Date   TSH 1.49 06/24/2023   T3TOTAL 121.0 07/29/2016   IMPRESSION AND PLAN:  Obesity class I, weight management--> continues to do well and gradually lose weight with good lifestyle modification and medication.  BMI is down from 30.5 to 27.5. Intolerant of tirzepatide--> severe dry eyes. Doing well on Ozempic. Increase to 1 mg subcu q. weekly dosing. Refill x 2. He will call with report in a month or 2 about his progress and tolerance of this dose. Follow-up in 3 months.  An After Visit Summary was printed and given to the patient.  FOLLOW UP: Return in about 3 months (around 02/12/2024) for routine chronic illness f/u.  Signed:  Santiago Bumpers, MD           11/14/2023

## 2023-11-25 ENCOUNTER — Telehealth: Payer: Self-pay | Admitting: Family Medicine

## 2023-11-25 NOTE — Telephone Encounter (Signed)
Cover my meds faxed prior authorization follow up information , to be filled out by provider. Patient requested to send it back via Fax within 7-days. Document is located in providers tray at front office.Please advise at Mobile [phone] (mobile)

## 2023-12-08 ENCOUNTER — Other Ambulatory Visit (HOSPITAL_COMMUNITY): Payer: Self-pay

## 2024-02-05 ENCOUNTER — Other Ambulatory Visit: Payer: Self-pay | Admitting: Family Medicine

## 2024-02-13 ENCOUNTER — Ambulatory Visit: Payer: BC Managed Care – PPO | Admitting: Family Medicine

## 2024-02-16 ENCOUNTER — Ambulatory Visit: Payer: BC Managed Care – PPO | Admitting: Family Medicine

## 2024-02-16 ENCOUNTER — Encounter: Payer: Self-pay | Admitting: Family Medicine

## 2024-02-16 VITALS — BP 121/74 | HR 64 | Ht 72.25 in

## 2024-02-16 DIAGNOSIS — E663 Overweight: Secondary | ICD-10-CM | POA: Diagnosis not present

## 2024-02-16 DIAGNOSIS — Z7689 Persons encountering health services in other specified circumstances: Secondary | ICD-10-CM

## 2024-02-16 MED ORDER — PAROXETINE HCL 30 MG PO TABS: 30.0000 mg | ORAL_TABLET | Freq: Every day | ORAL | 3 refills | Status: DC

## 2024-02-16 MED ORDER — SEMAGLUTIDE (1 MG/DOSE) 4 MG/3ML ~~LOC~~ SOPN: 1.0000 mg | PEN_INJECTOR | SUBCUTANEOUS | 2 refills | Status: DC

## 2024-02-16 NOTE — Progress Notes (Signed)
 OFFICE VISIT  02/16/2024  CC:  Chief Complaint  Patient presents with   Medical Management of Chronic Issues    Patient is a 43 y.o. male who presents for 82-month follow-up overweight/weight management. A/P as of last visit: "Obesity class I, weight management--> continues to do well and gradually lose weight with good lifestyle modification and medication.  BMI is down from 30.5 to 27.5. Intolerant of tirzepatide--> severe dry eyes. Doing well on Ozempic. Increase to 1 mg subcu q. weekly dosing."  INTERIM HX: Bernard Dalton is doing well. He takes somewhere between 0.5 and 1 mg of his Ozempic every week.  No adverse side effects at this dosing. He is maintaining his weight.  He continues good exercise and dietary habits.   Past Medical History:  Diagnosis Date   ADD (attention deficit disorder)    Started seeing Dr. Elisabeth Most at [facility] 07/2019->adderall changed to vyvanse, stable on 20mg  qd as of 12/2019   Allergic rhinitis    + allerg conjunctivitis.  Immunotherapy started 2019   Anxiety and depression    Blood transfusion without reported diagnosis 07/16/2019   Chronic prostatitis    chronic irritative voiding symptoms: pelvic PT via [facility] helping as of 07/2016   Concussion    DDD (degenerative disc disease), cervical 08/2018   (Dr. Retia Passe):  C5-C6 --ESI 25% improvement in fall 2019.  Pt desiring C spine surgery to see if this alleviates his symptom complex..   Diverticulosis    with 'itis per pt report; however, review of old record CT reports shows mild SB enteritis with mesenteric adenopathy 06/2015; then 11/2014 CT abd/pelv done for urinary frequency and hematuria showed NORMAL abd/pelv   Elevation of optic disc, bilateral 2019   Normal variant per ophtho--Dr. Hardie Shackleton.   Epigastric burning sensation 2020   EGD 08/13/19->gastritis. 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 papilledema on optho exam.  Dr. Epimenio Foot with neuro saw him 04/2018, no papilledema noted and opening CSF pressure was nl.  CSF labs nl.  ESR and ANA normal.  No clear reason for HA's found.  Imipr trial no help.  Trig pt inj's in occipi mm's 05/2018 by Dr. Epimenio Foot. no help.Topamax trial 02/2019.   Headache syndrome 02/2018   As of 07/2018, pt has plan to see Dr. Noel Gerold, NS/spine specialist as next step (his HAs are likely cervicogenic).  Dr. Retia Passe did C7-T1 ESI on 09/11/18--25% improvement.    IBS (irritable bowel syndrome)    diarr predom as per old PCP records   Nephrolithiasis    Nonobstructive, noted on CT (1-2 mm)-has never passed a stone that he knows of.   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 B12 normalized with oral B12    Past Surgical History:  Procedure Laterality Date   APPLICATION OF ROBOTIC ASSISTANCE FOR SPINAL PROCEDURE N/A 07/16/2019   Procedure: APPLICATION OF ROBOTIC ASSISTANCE FOR SPINAL PROCEDURE;  Surgeon: Barnett Abu, MD;  Location: [facility] OR;  Service: Neurosurgery;  Laterality: N/A;   ESOPHAGOGASTRODUODENOSCOPY  08/13/2019   Antral gastritis; H pylori NEG   LASIK Bilateral 2011   POSTERIOR LUMBAR FUSION 4 LEVEL N/A 07/16/2019   Procedure: Thoracic Four to  Lumbar Two posterior fusion, Kyphectomy with osteotomies, segmental pedicle screw fixation;  Surgeon: Barnett Abu, MD;  Location: [facility] OR;  Service: Neurosurgery;  Laterality: N/A;  Thoracic Four to Lumbar Two posterior fusion, Kyphectomy with osteotomies, segmental pedicle screw fixation   REFRACTIVE SURGERY Bilateral     Outpatient Medications Prior to Visit  Medication Sig Dispense Refill   fexofenadine (ALLEGRA) 180 MG tablet Take 180 mg by mouth daily.      propranolol (INDERAL) 20 MG tablet 1-2 tabs po q6h prn anxiety 30 tablet 3   VYVANSE 20 MG capsule Take 1 capsule by mouth every morning.     Semaglutide, 1 MG/DOSE, 4 MG/3ML SOPN Inject 1 mg into the skin once a week. 3 mL 2   albuterol (PROVENTIL) (2.5 MG/3ML) 0.083% nebulizer solution Take 3 mLs (2.5 mg total) by nebulization every 6 (six) hours as needed for wheezing or shortness of breath. (Patient not taking: Reported on 02/16/2024) 30 mL 1   methocarbamol (ROBAXIN) 500 MG tablet Take 1 tablet by mouth every 6 (six) hours. (Patient not taking: Reported on 02/16/2024)     Mometasone Furo-Formoterol Fum (DULERA) 50-5 MCG/ACT AERO 2 puffs bid (Patient not taking: Reported on 02/16/2024) 13 each 0   ondansetron (ZOFRAN) 8 MG tablet Take 1 tablet (8 mg total) by mouth every 8 (eight) hours as needed for nausea or vomiting. (Patient not taking: Reported on 02/16/2024) 30 tablet 1   scopolamine (TRANSDERM-SCOP) 1 MG/3DAYS Place 1 patch (1.5 mg total) onto the skin every 3 (three) days. (Patient not taking: Reported on 02/16/2024) 4 patch 0   cycloSPORINE (RESTASIS) 0.05 % ophthalmic emulsion Place 1 drop into both eyes 2 (two) times daily. (Patient not taking: Reported on 02/16/2024) 60 each 11   PARoxetine (PAXIL) 30 MG tablet TAKE 1 TABLET BY MOUTH EVERY DAY 30 tablet 0   No facility-administered medications prior to visit.    No Known Allergies  Review of Systems As per HPI  PE:    02/16/2024    2:59 PM 11/14/2023    2:03 PM 09/26/2023    3:44 PM  Vitals with BMI  Height 6' 0.25"    Weight  204 lbs 13 oz 206 lbs  Systolic 121 128 782  Diastolic 74 83 82  Pulse 64 65 74   Body mass index is 27.58 kg/m.   Physical Exam  Gen: Alert, well appearing.  Patient is oriented to person, place, time, and situation. AFFECT: pleasant, lucid thought and speech. No further exam today  LABS:  None  IMPRESSION AND PLAN:  #1 overweight/weight management. Doing well on Ozempic.  Weight is stable  at 205 pounds. Continue 1 mg q. 7 days, new prescription today. Anxiety doing very well on Paxil 30 mg daily long-term.  An After Visit Summary was printed and given to the patient.  FOLLOW UP: Return in about 6 months (around 08/18/2024) for routine chronic illness f/u.  Signed:  Santiago Bumpers, MD           02/16/2024

## 2024-02-21 ENCOUNTER — Other Ambulatory Visit (HOSPITAL_COMMUNITY): Payer: Self-pay

## 2024-02-29 ENCOUNTER — Other Ambulatory Visit (HOSPITAL_COMMUNITY): Payer: Self-pay

## 2024-02-29 ENCOUNTER — Telehealth: Payer: Self-pay

## 2024-02-29 NOTE — Telephone Encounter (Addendum)
 Ozempic is an OOP cost for pt. Pt has been doing OOP and knows that a PA will no longer be attempted

## 2024-02-29 NOTE — Telephone Encounter (Signed)
 Pharmacy Patient Advocate Encounter   Received notification from CoverMyMeds that prior authorization for Ozempic (0.25 or 0.5 MG/DOSE) 2MG /3ML pen-injectors is required/requested.   Insurance verification completed.   The patient is insured through [facility] .   Per test claim: PA required and submitted KEY/EOC/Request #: [id number] CANCELLED due to: Product not covered for this health plan/disease state/medication as submitted. Product not covered by this plan for this diagnosis. For a FDA label approved diagnosis, please resubmit with an ICD 10 code or submit via other methods.   Ozempic is only approved for type 2 diabetes.

## 2024-04-12 DIAGNOSIS — Z79899 Other long term (current) drug therapy: Secondary | ICD-10-CM | POA: Diagnosis not present

## 2024-04-12 DIAGNOSIS — F902 Attention-deficit hyperactivity disorder, combined type: Secondary | ICD-10-CM | POA: Diagnosis not present

## 2024-05-18 ENCOUNTER — Ambulatory Visit: Admitting: Family Medicine

## 2024-05-24 ENCOUNTER — Ambulatory Visit: Admitting: Family Medicine

## 2024-05-24 ENCOUNTER — Encounter: Payer: Self-pay | Admitting: Family Medicine

## 2024-05-24 VITALS — BP 119/68 | HR 78 | Temp 98.3°F | Ht 72.25 in | Wt 194.4 lb

## 2024-05-24 DIAGNOSIS — F410 Panic disorder [episodic paroxysmal anxiety] without agoraphobia: Secondary | ICD-10-CM | POA: Diagnosis not present

## 2024-05-24 DIAGNOSIS — D171 Benign lipomatous neoplasm of skin and subcutaneous tissue of trunk: Secondary | ICD-10-CM | POA: Diagnosis not present

## 2024-05-24 DIAGNOSIS — E663 Overweight: Secondary | ICD-10-CM | POA: Diagnosis not present

## 2024-05-24 DIAGNOSIS — Z7689 Persons encountering health services in other specified circumstances: Secondary | ICD-10-CM

## 2024-05-24 DIAGNOSIS — F411 Generalized anxiety disorder: Secondary | ICD-10-CM | POA: Diagnosis not present

## 2024-05-24 MED ORDER — SEMAGLUTIDE (1 MG/DOSE) 4 MG/3ML ~~LOC~~ SOPN: 1.0000 mg | PEN_INJECTOR | SUBCUTANEOUS | 5 refills | Status: AC

## 2024-05-24 MED ORDER — PAROXETINE HCL 10 MG PO TABS: 10.0000 mg | ORAL_TABLET | Freq: Every day | ORAL | 1 refills | Status: DC

## 2024-05-24 NOTE — Progress Notes (Signed)
 OFFICE VISIT  05/24/2024  CC:  Chief Complaint  Patient presents with   Medical Management of Chronic Issues    Patient is a 43 y.o. male who presents for 29-month follow-up overweight/weight management. A/P as of last visit: #1 overweight/weight management. Doing well on Ozempic .  Weight is stable at 205 pounds. Continue 1 mg q. 7 days, new prescription today. Anxiety doing very well on Paxil  30 mg daily long-term.  INTERIM HX: Bernard Dalton is feeling well. He continues to take his Ozempic  weekly and has no side effects.  His weight loss has plateaued and he feels content with this. Additionally, he recently decreased his Paxil  to 20 mg (he had some 20 mg tabs at home from past prescriptions) because he had been doing well and he would like to eventually get off the medication. He denies any rebound anxiety or depressed mood. He requests some Paxil  10 mg tabs to try further weaning.  About 6 to 8 weeks ago he noticed a small globular swelling at the lower aspect of the left anterior chest. No pain.  Review of systems: No rash, no myalgias or arthralgias, no fatigue, no fevers, no night sweats, no nipple changes, no nipple discharge.  Past Medical History:  Diagnosis Date   ADD (attention deficit disorder)    Started seeing Dr. Adell Age at [facility] 07/2019->adderall changed to vyvanse, stable on 20mg  qd as of 12/2019   Allergic rhinitis    + allerg conjunctivitis.  Immunotherapy started 2019   Anxiety and depression    Blood transfusion without reported diagnosis 07/16/2019   Chronic prostatitis    chronic irritative voiding symptoms: pelvic PT via [facility] helping as of 07/2016   Concussion    DDD (degenerative disc disease), cervical 08/2018   (Dr. Allyn Arenas):  C5-C6 --ESI 25% improvement in fall 2019.  Pt desiring C spine surgery to see if this alleviates his symptom complex..   Diverticulosis    with 'itis per pt report; however, review of old record CT  reports shows mild SB enteritis with mesenteric adenopathy 06/2015; then 11/2014 CT abd/pelv done for urinary frequency and hematuria showed NORMAL abd/pelv   Elevation of optic disc, bilateral 2019   Normal variant per ophtho--Dr. Lissa Riding.   Epigastric burning sensation 2020   EGD 08/13/19->gastritis. 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 papilledema on optho exam.  Dr. Godwin Lat with neuro saw him 04/2018, no papilledema noted and opening CSF pressure was nl.  CSF labs nl.  ESR and ANA normal.  No clear reason for HA's found.  Imipr trial no help.  Trig pt inj's in occipi mm's 05/2018 by Dr. Godwin Lat. no help.Topamax  trial 02/2019.   Headache syndrome 02/2018   As of 07/2018, pt has plan to see Dr. Faylene Hoots, NS/spine specialist as next step (his HAs are likely cervicogenic).  Dr. Allyn Arenas did C7-T1 ESI on 09/11/18--25% improvement.    IBS (irritable bowel syndrome)    diarr predom as per old PCP records   Nephrolithiasis    Nonobstructive, noted on CT (1-2 mm)-has never passed a stone that he knows of.   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 B12 normalized with oral  B12    Past Surgical History:  Procedure Laterality Date   APPLICATION OF ROBOTIC ASSISTANCE FOR SPINAL PROCEDURE N/A 07/16/2019   Procedure: APPLICATION OF ROBOTIC ASSISTANCE FOR SPINAL PROCEDURE;  Surgeon: Elna Haggis, MD;  Location: [facility] OR;  Service: Neurosurgery;  Laterality: N/A;   ESOPHAGOGASTRODUODENOSCOPY  08/13/2019   Antral gastritis; H pylori NEG   LASIK Bilateral 2011   POSTERIOR LUMBAR FUSION 4 LEVEL N/A 07/16/2019   Procedure: Thoracic Four to Lumbar Two posterior fusion, Kyphectomy  with osteotomies, segmental pedicle screw fixation;  Surgeon: Elna Haggis, MD;  Location: [facility] OR;  Service: Neurosurgery;  Laterality: N/A;  Thoracic Four to Lumbar Two posterior fusion, Kyphectomy with osteotomies, segmental pedicle screw fixation   REFRACTIVE SURGERY Bilateral     Outpatient Medications Prior to Visit  Medication Sig Dispense Refill   fexofenadine (ALLEGRA) 180 MG tablet Take 180 mg by mouth daily.     Mometasone Furo-Formoterol Fum (DULERA ) 50-5 MCG/ACT AERO 2 puffs bid 13 each 0   propranolol  (INDERAL ) 20 MG tablet 1-2 tabs po q6h prn anxiety 30 tablet 3   VYVANSE 20 MG capsule Take 1 capsule by mouth every morning.     PARoxetine  (PAXIL ) 30 MG tablet Take 1 tablet (30 mg total) by mouth daily. 90 tablet 3   Semaglutide , 1 MG/DOSE, 4 MG/3ML SOPN Inject 1 mg into the skin once a week. 3 mL 2   albuterol  (PROVENTIL ) (2.5 MG/3ML) 0.083% nebulizer solution Take 3 mLs (2.5 mg total) by nebulization every 6 (six) hours as needed for wheezing or shortness of breath. (Patient not taking: Reported on 05/24/2024) 30 mL 1   methocarbamol  (ROBAXIN ) 500 MG tablet Take 1 tablet by mouth every 6 (six) hours. (Patient not taking: Reported on 05/24/2024)     ondansetron  (ZOFRAN ) 8 MG tablet Take 1 tablet (8 mg total) by mouth every 8 (eight) hours as needed for nausea or vomiting. (Patient not taking: Reported on 05/24/2024) 30 tablet 1   scopolamine  (TRANSDERM-SCOP) 1 MG/3DAYS Place 1 patch (1.5 mg total) onto the skin every 3 (three) days. (Patient not taking: Reported on 05/24/2024) 4 patch 0   No facility-administered medications prior to visit.    No Known Allergies  Review of Systems As per HPI  PE:    05/24/2024    1:05 PM 02/16/2024    2:59 PM 11/14/2023    2:03 PM  Vitals with BMI  Height 6' 0.25 6' 0.25   Weight 194 lbs 6 oz  204 lbs 13 oz  BMI 26.19    Systolic 119 121 829  Diastolic 68 74 83  Pulse 78 64 65    Physical Exam  Gen: Alert, well appearing.  Patient is  oriented to person, place, time, and situation. AFFECT: pleasant, lucid thought and speech. In the left anterior chest region over 10th/11th rib level at the midclavicular line there is a rubbery and mobile subcutaneous nodule approximately 3 cm in size.  No tenderness.  No overlying skin changes. Aaron Aas  LABS:  Last CBC Lab Results  Component Value Date   WBC 5.1 03/02/2022   HGB 15.2 03/02/2022   HCT 44.3 03/02/2022   MCV 91.2 03/02/2022   MCH 31.2 07/19/2019   RDW 13.9 03/02/2022   PLT 181.0 03/02/2022   Last metabolic panel Lab Results  Component Value Date   GLUCOSE 94 06/24/2023   NA 139 06/24/2023   K 3.6 06/24/2023   CL 102 06/24/2023   CO2 30 06/24/2023   BUN 11 06/24/2023  CREATININE 1.06 06/24/2023   GFR 86.78 06/24/2023   CALCIUM  9.1 06/24/2023   PHOS 3.8 03/20/2021   PROT 6.7 06/24/2023   ALBUMIN  4.4 06/24/2023   BILITOT 1.3 (H) 06/24/2023   ALKPHOS 83 06/24/2023   AST 19 06/24/2023   ALT 39 06/24/2023   ANIONGAP 5 07/19/2019   Last hemoglobin A1c Lab Results  Component Value Date   HGBA1C 4.7 07/29/2016   IMPRESSION AND PLAN:  #1 overweight/weight management. Doing well on Ozempic .  He has been taking 1/2 of his 1mg  dose q7d.  Weight is down to 195 pounds and holding steady. He'll continue current dosing and f/u in 6 mo.  2) GAD and history of major depressive disorder. He has done well long-term on Paxil .  He had put on a lot of weight after getting on the medication but since it worked so well he was very hesitant to get off of it. Lately he has decreased his dose from 30 mg daily to 20 mg daily and feels like things are going fine.  He would like to further decrease to 10 mg a day and see how this goes.  Prescription sent for 10 mg tabs today, #30, refill x 1.  He will send message with update when next prescription needed.  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 Reassured.  Signs/symptoms to call or return for were  reviewed and pt expressed understanding. Bedside ultrasound today showed a hyperechoic subcu nodule with a few septae.  Borders are very distinct.  This is clearly separate from the underlying musculature and ribs.  An After Visit Summary was printed and given to the patient.  FOLLOW UP: Return in about 6 months (around 11/23/2024) for routine chronic illness f/u.  Signed:  Arletha Lady, MD           05/24/2024

## 2024-05-25 ENCOUNTER — Other Ambulatory Visit (HOSPITAL_COMMUNITY): Payer: Self-pay

## 2024-05-25 ENCOUNTER — Telehealth: Payer: Self-pay

## 2024-05-25 NOTE — Telephone Encounter (Signed)
**Note De-identified  [place] Obfuscation** Please advise 

## 2024-05-25 NOTE — Telephone Encounter (Signed)
 Ozempic [place] is approved exclusively as an adjunct to diet and exercise to improve glycemic  control in adults with type 2 diabetes mellitus. A review of patient's medical chart reveals no  documented diagnosis of type 2 diabetes or an A1C indicative of diabetes. Therefore, they do not  currently meet the criteria for prior authorization of this medication. If clinically appropriate, alternative  options such as Saxenda, Zepbound , or Wegovy  may be considered for this patient.   Tried test bil for Zepbound  and Adipex, patient has plan benefit exclusion for weight loss medications

## 2024-05-25 NOTE — Telephone Encounter (Signed)
 I believe patient pays out-of-pocket for this but please verify with him.

## 2024-06-08 ENCOUNTER — Encounter: Payer: Self-pay | Admitting: Family Medicine

## 2024-06-09 MED ORDER — PAROXETINE HCL 10 MG PO TABS: 10.0000 mg | ORAL_TABLET | Freq: Every day | ORAL | 3 refills | Status: DC

## 2024-06-09 NOTE — Telephone Encounter (Signed)
 Paroxetine  10mg  rx sent to [facility]

## 2024-09-04 ENCOUNTER — Encounter: Payer: Self-pay | Admitting: Family Medicine

## 2024-09-04 MED ORDER — PAROXETINE HCL 20 MG PO TABS: 20.0000 mg | ORAL_TABLET | Freq: Every day | ORAL | 3 refills | Status: AC

## 2024-09-04 NOTE — Telephone Encounter (Signed)
 Okay, paroxetine  20 mg prescription sent to [facility].

## 2024-09-04 NOTE — Telephone Encounter (Signed)
 Pt's last OV was 6/12, mentioned in note for pt to send a message when he needed an updated prescription.   Please fill, if appropriate.

## 2024-10-11 DIAGNOSIS — F902 Attention-deficit hyperactivity disorder, combined type: Secondary | ICD-10-CM | POA: Diagnosis not present

## 2024-10-11 DIAGNOSIS — Z79899 Other long term (current) drug therapy: Secondary | ICD-10-CM | POA: Diagnosis not present

## 2024-10-12 DIAGNOSIS — F902 Attention-deficit hyperactivity disorder, combined type: Secondary | ICD-10-CM | POA: Diagnosis not present

## 2024-11-23 ENCOUNTER — Ambulatory Visit: Admitting: Family Medicine

## 2024-11-23 ENCOUNTER — Encounter: Payer: Self-pay | Admitting: Family Medicine

## 2024-11-23 VITALS — BP 128/79 | HR 73 | Temp 98.3°F | Ht 72.25 in | Wt 194.4 lb

## 2024-11-23 DIAGNOSIS — Z7689 Persons encountering health services in other specified circumstances: Secondary | ICD-10-CM | POA: Diagnosis not present

## 2024-11-23 DIAGNOSIS — Z23 Encounter for immunization: Secondary | ICD-10-CM | POA: Diagnosis not present

## 2024-11-23 DIAGNOSIS — F411 Generalized anxiety disorder: Secondary | ICD-10-CM

## 2024-11-23 DIAGNOSIS — E538 Deficiency of other specified B group vitamins: Secondary | ICD-10-CM

## 2024-11-23 DIAGNOSIS — Z0000 Encounter for general adult medical examination without abnormal findings: Secondary | ICD-10-CM | POA: Diagnosis not present

## 2024-11-23 LAB — COMPREHENSIVE METABOLIC PANEL WITH GFR
ALT: 14 U/L (ref 0–53)
AST: 12 U/L (ref 0–37)
Albumin: 4.6 g/dL (ref 3.5–5.2)
Alkaline Phosphatase: 61 U/L (ref 39–117)
BUN: 9 mg/dL (ref 6–23)
CO2: 32 meq/L (ref 19–32)
Calcium: 9.8 mg/dL (ref 8.4–10.5)
Chloride: 101 meq/L (ref 96–112)
Creatinine, Ser: 0.96 mg/dL (ref 0.40–1.50)
GFR: 96.77 mL/min (ref >60.00)
Glucose, Bld: 78 mg/dL (ref 70–99)
Potassium: 4.3 meq/L (ref 3.5–5.1)
Sodium: 142 meq/L (ref 135–145)
Total Bilirubin: 1 mg/dL (ref 0.2–1.2)
Total Protein: 6.5 g/dL (ref 6.0–8.3)

## 2024-11-23 LAB — CBC WITH DIFFERENTIAL/PLATELET
Basophils Absolute: 0 K/uL (ref 0.0–0.1)
Basophils Relative: 0.3 % (ref 0.0–3.0)
Eosinophils Absolute: 0.1 K/uL (ref 0.0–0.7)
Eosinophils Relative: 0.7 % (ref 0.0–5.0)
HCT: 43.6 % (ref 39.0–52.0)
Hemoglobin: 15.2 g/dL (ref 13.0–17.0)
Lymphocytes Relative: 25 % (ref 12.0–46.0)
Lymphs Abs: 1.8 K/uL (ref 0.7–4.0)
MCHC: 34.9 g/dL (ref 30.0–36.0)
MCV: 86.5 fl (ref 78.0–100.0)
Monocytes Absolute: 0.5 K/uL (ref 0.1–1.0)
Monocytes Relative: 6.8 % (ref 3.0–12.0)
Neutro Abs: 4.9 K/uL (ref 1.4–7.7)
Neutrophils Relative %: 67.2 % (ref 43.0–77.0)
Platelets: 217 K/uL (ref 150.0–400.0)
RBC: 5.04 Mil/uL (ref 4.22–5.81)
RDW: 13.9 % (ref 11.5–15.5)
WBC: 7.2 K/uL (ref 4.0–10.5)

## 2024-11-23 LAB — HEMOGLOBIN A1C: Hgb A1c MFr Bld: 4.6 % (ref 4.6–6.5)

## 2024-11-23 LAB — LIPID PANEL
Cholesterol: 189 mg/dL (ref 0–200)
HDL: 45.9 mg/dL (ref >39.00)
LDL Cholesterol: 126 mg/dL — ABNORMAL HIGH (ref 0–99)
NonHDL: 143.27
Total CHOL/HDL Ratio: 4
Triglycerides: 85 mg/dL (ref 0.0–149.0)
VLDL: 17 mg/dL (ref 0.0–40.0)

## 2024-11-23 LAB — TSH: TSH: 1.17 u[IU]/mL (ref 0.35–5.50)

## 2024-11-23 LAB — VITAMIN B12: Vitamin B-12: 289 pg/mL (ref 211–911)

## 2024-11-23 MED ORDER — FLUOXETINE HCL 20 MG PO TABS: 20.0000 mg | ORAL_TABLET | Freq: Every day | ORAL | 1 refills | Status: DC

## 2024-11-23 NOTE — Progress Notes (Signed)
 OFFICE VISIT  11/23/2024  CC:  Chief Complaint  Patient presents with   Medical Management of Chronic Issues    Patient is a 43 y.o. male who presents for annual health maintenance exam and 62-month follow-up weight management, anxiety, and depression. A/P as of last visit: #1 overweight/weight management. Doing well on Ozempic .  He has been taking 1/2 of his 1mg  dose q7d.  Weight is down to 195 pounds and holding steady. He'll continue current dosing and f/u in 6 mo.   2) GAD and history of major depressive disorder. He has done well long-term on Paxil .  He had put on a lot of weight after getting on the medication but since it worked so well he was very hesitant to get off of it. Lately he has decreased his dose from 30 mg daily to 20 mg daily and feels like things are going fine.  He would like to further decrease to 10 mg a day and see how this goes.  Prescription sent for 10 mg tabs today, #30, refill x 1.  He will send message with update when next prescription needed.   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 Reassured.  Signs/symptoms to call or return for were reviewed and pt expressed understanding. Bedside ultrasound today showed a hyperechoic subcu nodule with a few septae.  Borders are very distinct.  This is clearly separate from the underlying musculature and ribs.  INTERIM HX: After last visit his anxiety level began to surge when he went back to 10 mg Paxil  daily dosing. He then got back on the 20 mg dosing. He is anxiety leveled off.  He does not like being on the medication very much though, says he has too much appetite and lack of motivation.  It helps his anxiety very well, though. He experiments with weaning down very slowly.  He states he has been on the medication since he was about 43 years old.  He describes weaning in the past with use of fluoxetine because of its long half-life.  Doing well on Ozempic  still.  Past Medical  History:  Diagnosis Date   ADD (attention deficit disorder)    Started seeing Dr. Elouise at [facility] 07/2019->adderall changed to vyvanse, stable on 20mg  qd as of 12/2019   Allergic rhinitis    + allerg conjunctivitis.  Immunotherapy started 2019   Anxiety and depression    Blood transfusion without reported diagnosis 07/16/2019   Chronic prostatitis    chronic irritative voiding symptoms: pelvic PT via [facility] helping as of 07/2016   Concussion    DDD (degenerative disc disease), cervical 08/2018   (Dr. Darlean):  C5-C6 --ESI 25% improvement in fall 2019.  Pt desiring C spine surgery to see if this alleviates his symptom complex..   Diverticulosis    with 'itis per pt report; however, review of old record CT reports shows mild SB enteritis with mesenteric adenopathy 06/2015; then 11/2014 CT abd/pelv done for urinary frequency and hematuria showed NORMAL abd/pelv   Elevation of optic disc, bilateral 2019   Normal variant per ophtho--Dr. Gennie.   Epigastric burning sensation 2020   EGD 08/13/19->gastritis. 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 papilledema on optho exam.  Dr. Vear with neuro saw him 04/2018, no papilledema noted and opening CSF pressure was nl.  CSF labs nl.  ESR and ANA  normal.  No clear reason for HA's found.  Imipr trial no help.  Trig pt inj's in occipi mm's 05/2018 by Dr. Vear. no help.Topamax  trial 02/2019.   Headache syndrome 02/2018   As of 07/2018, pt has plan to see Dr. Gust, NS/spine specialist as next step (his HAs are likely cervicogenic).  Dr. Darlean did C7-T1 ESI on 09/11/18--25% improvement.    IBS (irritable bowel syndrome)    diarr predom as per old PCP records   Nephrolithiasis    Nonobstructive, noted on CT (1-2 mm)-has never passed a stone that he knows of.   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 B12 normalized with oral B12    Past Surgical History:  Procedure Laterality Date   APPLICATION OF ROBOTIC ASSISTANCE FOR SPINAL PROCEDURE N/A 07/16/2019   Procedure: APPLICATION OF ROBOTIC ASSISTANCE FOR SPINAL PROCEDURE;  Surgeon: Colon Shove, MD;  Location: [facility] OR;  Service: Neurosurgery;  Laterality: N/A;   ESOPHAGOGASTRODUODENOSCOPY  08/13/2019   Antral gastritis; H pylori NEG   LASIK Bilateral 2011   POSTERIOR LUMBAR FUSION 4 LEVEL N/A 07/16/2019   Procedure: Thoracic Four to Lumbar Two posterior fusion, Kyphectomy with osteotomies, segmental pedicle screw fixation;  Surgeon: Colon Shove, MD;  Location: [facility] OR;  Service: Neurosurgery;  Laterality: N/A;  Thoracic Four to Lumbar Two posterior fusion, Kyphectomy with osteotomies, segmental pedicle screw fixation   REFRACTIVE SURGERY Bilateral    Social History   Socioeconomic History   Marital status: Married    Spouse name: Not on file   Number of children: 2   Years of education: Not on file   Highest education level: Some college, no degree  Occupational History   Occupation: computer work  Tobacco Use   Smoking status: Never   Smokeless tobacco: Never  Vaping Use   Vaping status: Never Used  Substance and Sexual Activity   Alcohol use: No   Drug use: No   Sexual activity: Not on file  Other Topics Concern   Not on file  Social History Narrative   Married, 2 children.   Relocated from [facility] to [facility] 08/2015.   Educ: college at [employer].   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   No T/A/Ds.   Social Drivers of Health   Tobacco Use: Low Risk (11/23/2024)   Patient History    Smoking Tobacco Use: Never    Smokeless Tobacco Use: Never    Passive Exposure: Not on file  Financial Resource  Strain: Low Risk (11/22/2024)   Overall Financial Resource Strain (CARDIA)    Difficulty of Paying Living Expenses: Not hard at all  Food Insecurity: No Food Insecurity (11/22/2024)   Epic    Worried About Programme Researcher, Broadcasting/film/video in the Last Year: Never true    Ran Out of Food in the Last Year: Never true  Transportation Needs: No Transportation Needs (11/22/2024)   Epic    Lack of Transportation (Medical): No    Lack of Transportation (Non-Medical): No  Physical Activity: Insufficiently Active (11/22/2024)   Exercise Vital Sign    Days of Exercise per Week: 2 days    Minutes of Exercise per Session: 20 min  Stress: No Stress Concern Present (11/22/2024)   [employer] of Occupational Health - Occupational Stress  Questionnaire    Feeling of Stress: Only a little  Social Connections: Moderately Isolated (11/22/2024)   Social Connection and Isolation Panel    Frequency of Communication with Friends and Family: More than three times a week    Frequency of Social Gatherings with Friends and Family: More than three times a week    Attends Religious Services: Never    Database Administrator or Organizations: No    Attends Engineer, Structural: Not on file    Marital Status: Married  Depression (PHQ2-9): Low Risk (11/23/2024)   Depression (PHQ2-9)    PHQ-2 Score: 3  Alcohol Screen: Low Risk (06/24/2023)   Alcohol Screen    Last Alcohol Screening Score (AUDIT): 4  Housing: Unknown (11/22/2024)   Epic    Unable to Pay for Housing in the Last Year: No    Number of Times Moved in the Last Year: Not on file    Homeless in the Last Year: No  Utilities: Not on file  Health Literacy: Not on file   Family History  Problem Relation Age of Onset   Alcohol abuse Mother    Alcohol abuse Father    Kidney disease Father    Alcohol abuse Maternal Grandmother    Alcohol abuse Maternal Grandfather    Stroke Paternal Grandmother    Congestive Heart Failure Paternal Grandmother     Alcohol abuse Paternal Grandmother    Alcohol abuse Paternal Grandfather    ADD / ADHD Brother    ADD / ADHD Brother    Stomach cancer Neg Hx    Colon cancer Neg Hx    Esophageal cancer Neg Hx    Rectal cancer Neg Hx    ROS as above, plus--> no fevers, no CP, no SOB, no wheezing, no cough, no dizziness, no HAs, no rashes, no melena/hematochezia.  No polyuria or polydipsia.  No myalgias or arthralgias.  No focal weakness, paresthesias, or tremors.  No acute vision or hearing abnormalities.  No dysuria or unusual/new urinary urgency or frequency.  No recent changes in lower legs. No n/v/d or abd pain.  No palpitations.    Outpatient Medications Prior to Visit  Medication Sig Dispense Refill   fexofenadine (ALLEGRA) 180 MG tablet Take 180 mg by mouth daily.     Mometasone Furo-Formoterol Fum (DULERA ) 50-5 MCG/ACT AERO 2 puffs bid 13 each 0   PARoxetine  (PAXIL ) 20 MG tablet Take 1 tablet (20 mg total) by mouth daily. 90 tablet 3   propranolol  (INDERAL ) 20 MG tablet 1-2 tabs po q6h prn anxiety 30 tablet 3   Semaglutide , 1 MG/DOSE, 4 MG/3ML SOPN Inject 1 mg into the skin once a week. 3 mL 5   VYVANSE 20 MG capsule Take 1 capsule by mouth every morning.     albuterol  (PROVENTIL ) (2.5 MG/3ML) 0.083% nebulizer solution Take 3 mLs (2.5 mg total) by nebulization every 6 (six) hours as needed for wheezing or shortness of breath. 30 mL 1   ondansetron  (ZOFRAN ) 8 MG tablet Take 1 tablet (8 mg total) by mouth every 8 (eight) hours as needed for nausea or vomiting. 30 tablet 1   methocarbamol  (ROBAXIN ) 500 MG tablet Take 1 tablet by mouth every 6 (six) hours. (Patient not taking: Reported on 11/23/2024)     scopolamine  (TRANSDERM-SCOP) 1 MG/3DAYS Place 1 patch (1.5 mg total) onto the skin every 3 (three) days. (Patient not taking: Reported on 11/23/2024) 4 patch 0   No facility-administered medications prior to visit.  Allergies[1]  Review of Systems  Constitutional:  Negative for appetite change,  chills, fatigue and fever.  HENT:  Negative for congestion, dental problem, ear pain and sore throat.   Eyes:  Negative for discharge, redness and visual disturbance.  Respiratory:  Negative for cough, chest tightness, shortness of breath and wheezing.   Cardiovascular:  Negative for chest pain, palpitations and leg swelling.  Gastrointestinal:  Negative for abdominal pain, blood in stool, diarrhea, nausea and vomiting.  Genitourinary:  Negative for difficulty urinating, dysuria, flank pain, frequency, hematuria and urgency.  Musculoskeletal:  Negative for arthralgias, back pain, joint swelling, myalgias and neck stiffness.  Skin:  Negative for pallor and rash.  Neurological:  Negative for dizziness, speech difficulty, weakness and headaches.  Hematological:  Negative for adenopathy. Does not bruise/bleed easily.  Psychiatric/Behavioral:  Negative for confusion and sleep disturbance. The patient is not nervous/anxious.    As per HPI  PE:    11/23/2024    1:40 PM 05/24/2024    1:05 PM 02/16/2024    2:59 PM  Vitals with BMI  Height 6' 0.25 6' 0.25 6' 0.25  Weight 194 lbs 6 oz 194 lbs 6 oz   BMI 26.19 26.19   Systolic 128 119 878  Diastolic 79 68 74  Pulse 73 78 64     Physical Exam  Gen: Alert, well appearing.  Patient is oriented to person, place, time, and situation. AFFECT: pleasant, lucid thought and speech. ENT: Ears: EACs clear, normal epithelium.  TMs with good light reflex and landmarks bilaterally.  Eyes: no injection, icteris, swelling, or exudate.  EOMI, PERRLA. Nose: no drainage or turbinate edema/swelling.  No injection or focal lesion.  Mouth: lips without lesion/swelling.  Oral mucosa pink and moist.  Dentition intact and without obvious caries or gingival swelling.  Oropharynx without erythema, exudate, or swelling.  Neck: supple/nontender.  No LAD, mass, or TM.  Carotid pulses 2+ bilaterally, without bruits. CV: RRR, no m/r/g.   LUNGS: CTA bilat, nonlabored  resps, good aeration in all lung fields. ABD: soft, NT, ND, BS normal.  No hepatospenomegaly or mass.  No bruits. EXT: no clubbing, cyanosis, or edema.  Musculoskeletal: no joint swelling, erythema, warmth, or tenderness.  ROM of all joints intact. Skin - no sores or suspicious lesions or rashes or color changes   LABS:  Last CBC Lab Results  Component Value Date   WBC 5.1 03/02/2022   HGB 15.2 03/02/2022   HCT 44.3 03/02/2022   MCV 91.2 03/02/2022   MCH 31.2 07/19/2019   RDW 13.9 03/02/2022   PLT 181.0 03/02/2022   Last metabolic panel Lab Results  Component Value Date   GLUCOSE 94 06/24/2023   NA 139 06/24/2023   K 3.6 06/24/2023   CL 102 06/24/2023   CO2 30 06/24/2023   BUN 11 06/24/2023   CREATININE 1.06 06/24/2023   GFR 86.78 06/24/2023   CALCIUM  9.1 06/24/2023   PHOS 3.8 03/20/2021   PROT 6.7 06/24/2023   ALBUMIN  4.4 06/24/2023   BILITOT 1.3 (H) 06/24/2023   ALKPHOS 83 06/24/2023   AST 19 06/24/2023   ALT 39 06/24/2023   ANIONGAP 5 07/19/2019   Last lipids Lab Results  Component Value Date   CHOL 185 06/24/2023   HDL 39.60 06/24/2023   LDLCALC 116 (H) 06/24/2023   TRIG 145.0 06/24/2023   CHOLHDL 5 06/24/2023   Last hemoglobin A1c Lab Results  Component Value Date   HGBA1C 4.7 07/29/2016   Last thyroid  functions Lab Results  Component Value Date   TSH 1.49 06/24/2023   T3TOTAL 121.0 07/29/2016   FREET4 1.2 07/29/2016   Last vitamin D  Lab Results  Component Value Date   VD25OH 27 (L) 03/20/2021   Lab Results  Component Value Date   VITAMINB12 921 (H) 09/25/2019   IMPRESSION AND PLAN:  #1 health maintenance exam: Reviewed age and gender appropriate health maintenance issues (prudent diet, regular exercise, health risks of tobacco and excessive alcohol, use of seatbelts, fire alarms in home, use of sunscreen).  Also reviewed age and gender appropriate health screening as well as vaccine recommendations. Vaccines: Flu vaccine today. Labs:  Health panel, hemoglobin A1c, vitamin B12. Prostate ca screening: average risk patient= as per latest guidelines, start screening at 75 yrs of age. Colon ca screening: average risk patient= as per latest guidelines, start screening at 33 yrs of age.  #2 GAD. Paxil  very effective but when he weans down it is very difficult even though he does not very slow. He will very gradually wean down to a low-dose of Paxil  and then start fluoxetine 20 mg a day and then continue to wean completely off Paxil .  He will then discontinue fluoxetine after about 30 to 45 days.  He has experienced with this type of wean in the past.  He will call if any problems or questions.  #3 overweight/weight management. Doing well on Ozempic .  He has been taking 1/2 of his 1mg  dose q7d.  Weight is down to 195 pounds and holding steady. He'll continue current dosing and f/u in 6 mo.  An After Visit Summary was printed and given to the patient.  FOLLOW UP: No follow-ups on file.  Signed:  Gerlene Hockey, MD           11/23/2024     [1] No Known Allergies

## 2024-11-23 NOTE — Patient Instructions (Signed)
 Health Maintenance, Male  Adopting a healthy lifestyle and getting preventive care are important in promoting health and wellness. Ask your health care provider about:  The right schedule for you to have regular tests and exams.  Things you can do on your own to prevent diseases and keep yourself healthy.  What should I know about diet, weight, and exercise?  Eat a healthy diet    Eat a diet that includes plenty of vegetables, fruits, low-fat dairy products, and lean protein.  Do not eat a lot of foods that are high in solid fats, added sugars, or sodium.  Maintain a healthy weight  Body mass index (BMI) is a measurement that can be used to identify possible weight problems. It estimates body fat based on height and weight. Your health care provider can help determine your BMI and help you achieve or maintain a healthy weight.  Get regular exercise  Get regular exercise. This is one of the most important things you can do for your health. Most adults should:  Exercise for at least 150 minutes each week. The exercise should increase your heart rate and make you sweat (moderate-intensity exercise).  Do strengthening exercises at least twice a week. This is in addition to the moderate-intensity exercise.  Spend less time sitting. Even light physical activity can be beneficial.  Watch cholesterol and blood lipids  Have your blood tested for lipids and cholesterol at 43 years of age, then have this test every 5 years.  You may need to have your cholesterol levels checked more often if:  Your lipid or cholesterol levels are high.  You are older than 43 years of age.  You are at high risk for heart disease.  What should I know about cancer screening?  Many types of cancers can be detected early and may often be prevented. Depending on your health history and family history, you may need to have cancer screening at various ages. This may include screening for:  Colorectal cancer.  Prostate cancer.  Skin cancer.  Lung  cancer.  What should I know about heart disease, diabetes, and high blood pressure?  Blood pressure and heart disease  High blood pressure causes heart disease and increases the risk of stroke. This is more likely to develop in people who have high blood pressure readings or are overweight.  Talk with your health care provider about your target blood pressure readings.  Have your blood pressure checked:  Every 3-5 years if you are 24-52 years of age.  Every year if you are 3 years old or older.  If you are between the ages of 60 and 72 and are a current or former smoker, ask your health care provider if you should have a one-time screening for abdominal aortic aneurysm (AAA).  Diabetes  Have regular diabetes screenings. This checks your fasting blood sugar level. Have the screening done:  Once every three years after age 66 if you are at a normal weight and have a low risk for diabetes.  More often and at a younger age if you are overweight or have a high risk for diabetes.  What should I know about preventing infection?  Hepatitis B  If you have a higher risk for hepatitis B, you should be screened for this virus. Talk with your health care provider to find out if you are at risk for hepatitis B infection.  Hepatitis C  Blood testing is recommended for:  Everyone born from [birth year] through 1965.  Anyone  with known risk factors for hepatitis C.  Sexually transmitted infections (STIs)  You should be screened each year for STIs, including gonorrhea and chlamydia, if:  You are sexually active and are younger than 43 years of age.  You are older than 43 years of age and your health care provider tells you that you are at risk for this type of infection.  Your sexual activity has changed since you were last screened, and you are at increased risk for chlamydia or gonorrhea. Ask your health care provider if you are at risk.  Ask your health care provider about whether you are at high risk for HIV. Your health care provider  may recommend a prescription medicine to help prevent HIV infection. If you choose to take medicine to prevent HIV, you should first get tested for HIV. You should then be tested every 3 months for as long as you are taking the medicine.  Follow these instructions at home:  Alcohol use  Do not drink alcohol if your health care provider tells you not to drink.  If you drink alcohol:  Limit how much you have to 0-2 drinks a day.  Know how much alcohol is in your drink. In the [place] one drink equals one 12 oz bottle of beer (355 mL), one 5 oz glass of wine (148 mL), or one 1 oz glass of hard liquor (44 mL).  Lifestyle  Do not use any products that contain nicotine or tobacco. These products include cigarettes, chewing tobacco, and vaping devices, such as e-cigarettes. If you need help quitting, ask your health care provider.  Do not use street drugs.  Do not share needles.  Ask your health care provider for help if you need support or information about quitting drugs.  General instructions  Schedule regular health, dental, and eye exams.  Stay current with your vaccines.  Tell your health care provider if:  You often feel depressed.  You have ever been abused or do not feel safe at home.  Summary  Adopting a healthy lifestyle and getting preventive care are important in promoting health and wellness.  Follow your health care provider's instructions about healthy diet, exercising, and getting tested or screened for diseases.  Follow your health care provider's instructions on monitoring your cholesterol and blood pressure.  This information is not intended to replace advice given to you by your health care provider. Make sure you discuss any questions you have with your health care provider.  Document Revised: 04/20/2021 Document Reviewed: 04/20/2021  Elsevier Patient Education  2024 [employer].

## 2024-11-25 ENCOUNTER — Ambulatory Visit: Payer: Self-pay | Admitting: Family Medicine

## 2024-11-29 ENCOUNTER — Ambulatory Visit: Payer: Self-pay | Admitting: Podiatry

## 2024-11-29 DIAGNOSIS — B351 Tinea unguium: Secondary | ICD-10-CM | POA: Diagnosis not present

## 2024-11-29 MED ORDER — KETOCONAZOLE 2 % EX CREA: 1.0000 | TOPICAL_CREAM | Freq: Every day | CUTANEOUS | 0 refills | Status: AC

## 2024-11-29 MED ORDER — EFINACONAZOLE 10 % EX SOLN: 1.0000 [drp] | Freq: Every day | CUTANEOUS | 11 refills | Status: AC

## 2024-11-29 NOTE — Patient Instructions (Signed)
 Efinaconazole Topical solution What is this medication? EFINACONAZOLE (e FEE na KON a zole) treats fungal infections of the nails. It belongs to a group of medications called antifungals. It will not treat infections caused by bacteria or viruses. This medicine may be used for other purposes; ask your health care provider or pharmacist if you have questions. COMMON BRAND NAME(S): JUBLIA What should I tell my care team before I take this medication? They need to know if you have any of these conditions: An unusual or allergic reaction to efinaconazole, other medications, foods, dyes or preservatives Pregnant or trying to get pregnant Breast-feeding How should I use this medication? This medication is for external use only. Do not take by mouth. Wash your hands before and after use. Do not get it in your eyes. If you do, rinse your eyes with plenty of cool tap water. Use it as directed on the prescription label. Do not use it more often than directed. Use the medication for the full course as directed by your care team, even if you think you are better. Do not stop using it unless your care team tells you to stop it early. This medication comes with INSTRUCTIONS FOR USE. Ask your pharmacist for directions on how to use this medication. Read the information carefully. Talk to your pharmacist or care team if you have questions. Talk to your care team about the use of this medication in children. While it may be prescribed for children as young as 6 years for selected conditions, precautions do apply. Overdosage: If you think you have taken too much of this medicine contact a poison control center or emergency room at once. NOTE: This medicine is only for you. Do not share this medicine with others. What if I miss a dose? If you miss a dose, use it as soon as you can. If it is almost time for your next dose, use only that dose. Do not use double or extra doses. What may interact with this  medication? Interactions are not expected. Do not use any other skin products on the same area of skin without talking to your care team. This list may not describe all possible interactions. Give your health care provider a list of all the medicines, herbs, non-prescription drugs, or dietary supplements you use. Also tell them if you smoke, drink alcohol, or use illegal drugs. Some items may interact with your medicine. What should I watch for while using this medication? Visit your care team for regular checks on your progress. It may be some time before you see the benefit from this medication. Do not use nail polish or other nail cosmetic products on the treated nails. What side effects may I notice from receiving this medication? Side effects that you should report to your care team as soon as possible: Allergic reactions--skin rash, itching, hives, swelling of the face, lips, tongue, or throat Side effects that usually do not require medical attention (report to your care team if they continue or are bothersome): Ingrown nails Mild skin irritation, redness, or dryness This list may not describe all possible side effects. Call your doctor for medical advice about side effects. You may report side effects to FDA at 1-800-FDA-1088. Where should I keep my medication? Keep out of the reach of children and pets. Store at room temperature between 20 and 25 degrees C (68 and 77 degrees F). Do not freeze. Keep the container tightly closed. Get rid of any unused medication after the expiration date.  This medication is flammable. Avoid exposure to heat, flame, and smoking. To get rid of medications that are no longer needed or have expired: Take the medication to a medication take-back program. Check with your pharmacy or law enforcement to find a location. If you cannot return the medication, ask your pharmacist or care team how to get rid of this medication safely. NOTE: This sheet is a summary. It  may not cover all possible information. If you have questions about this medicine, talk to your doctor, pharmacist, or health care provider.  2024 [employer] Standard (2022-02-08 00:00:00)

## 2024-12-02 NOTE — Progress Notes (Signed)
"  °  Subjective:  Patient ID: Bernard Dalton, male    DOB: Nov 09, 1981,  MRN: [id number]  Chief Complaint  Patient presents with   Tinea Pedis    Rm12 Nail fungus bilateral toes and tinea pedis bilateral/ can not take lamisil makes him nausous    Discussed the use of AI scribe software for clinical note transcription with the patient, who gave verbal consent to proceed.  History of Present Illness Bernard Dalton is a 43 year old male who presents with chronic nail fungus.  He has had nail fungus for about 25 years, initially on the right great toe and now involving all fingernails and toenails. Since nail loss after an illness about eight years ago, the nails have become progressively thick, cracked, and painful, and are difficult for him to manage in daily activities.  He previously tried oral terbinafine around 2010 but stopped due to severe nausea despite Zofran . Over-the-counter soaps and lotions have not helped. He notes similar nail fungus in multiple family members. He denies itching or other skin problems but reports dry skin on the bottoms of his feet and no other complaints.      Objective:  There were no vitals filed for this visit.  Physical Exam General: AAO x3, NAD  Dermatological: As pictured below the nails are hypertrophic, dystrophic with yellow discoloration.  There is dystrophy noted the toenails.  There is no edema or erythema or signs of infection.  No open lesions. Vascular: Dorsalis Pedis artery and Posterior Tibial artery pedal pulses are 2/4 bilateral with immedate capillary fill time. There is no pain with calf compression, swelling, warmth, erythema.   Neruologic: Grossly intact via light touch bilateral.   Musculoskeletal: No gross boney pedal deformities bilateral. No pain, crepitus, or limitation noted with foot and ankle range of motion bilateral. Muscular strength 5/5 in all groups tested bilateral.          Results     Assessment:   1.  Dermatophytosis of nail      Plan:  Patient was evaluated and treated and all questions answered.  Assessment and Plan Assessment & Plan Onychomycosis Chronic onychomycosis affecting all nails, exacerbated post-hand, foot, and mouth disease. Oral terbinafine intolerable. Considering permanent nail removal due to severity. Jublia  recommended as topical treatment. Laser treatment is adjunctive, not covered by insurance. - Prescribe Jublia  (efinaconazole ) topical solution for nail application. - Coordinate with specialty pharmacy in [facility] for medication delivery. - Advise application of topical antifungal for skin involvement. - Instruct to apply medication every morning and night. - Monitor progress over 3 to 6 months for improvement. - Consider alternative treatments if no improvement, including oral medication or nail removal. - Discuss laser treatment as adjunctive option if topical treatment is insufficient. - Schedule follow-up or MyChart message check-in in 3 months to assess progress.   No follow-ups on file.   Donnice JONELLE Fees DPM  "

## 2024-12-19 ENCOUNTER — Other Ambulatory Visit: Payer: Self-pay | Admitting: Family Medicine

## 2025-05-24 ENCOUNTER — Ambulatory Visit: Admitting: Family Medicine
